# Patient Record
Sex: Male | Born: 1950
Health system: Southern US, Community
[De-identification: ages and names within clinical notes are randomized; demographics above are authoritative.]

## PROBLEM LIST (undated history)

## (undated) DIAGNOSIS — I10 Essential (primary) hypertension: Secondary | ICD-10-CM

## (undated) DIAGNOSIS — T7840XA Allergy, unspecified, initial encounter: Secondary | ICD-10-CM

## (undated) DIAGNOSIS — M1990 Unspecified osteoarthritis, unspecified site: Secondary | ICD-10-CM

## (undated) HISTORY — DX: Allergy, unspecified, initial encounter: T78.40XA

## (undated) HISTORY — DX: Essential (primary) hypertension: I10

## (undated) HISTORY — DX: Unspecified osteoarthritis, unspecified site: M19.90

---

## 2000-06-06 ENCOUNTER — Emergency Department (HOSPITAL_COMMUNITY): Admission: EM | Admit: 2000-06-06 | Discharge: 2000-06-06 | Payer: Self-pay | Admitting: Emergency Medicine

## 2011-07-31 ENCOUNTER — Other Ambulatory Visit: Payer: Self-pay

## 2011-07-31 MED ORDER — LISINOPRIL-HYDROCHLOROTHIAZIDE 20-25 MG PO TABS: 1.0000 | ORAL_TABLET | Freq: Every day | ORAL | Status: DC

## 2011-07-31 MED ORDER — AMLODIPINE BESYLATE 5 MG PO TABS: 5.0000 mg | ORAL_TABLET | Freq: Every day | ORAL | Status: DC

## 2011-08-23 ENCOUNTER — Ambulatory Visit (INDEPENDENT_AMBULATORY_CARE_PROVIDER_SITE_OTHER): Payer: BC Managed Care – PPO | Admitting: Family Medicine

## 2011-08-23 VITALS — BP 134/84 | HR 91 | Temp 98.7°F | Resp 18 | Ht 75.0 in | Wt 245.0 lb

## 2011-08-23 DIAGNOSIS — I10 Essential (primary) hypertension: Secondary | ICD-10-CM

## 2011-08-23 DIAGNOSIS — R300 Dysuria: Secondary | ICD-10-CM

## 2011-08-23 LAB — POCT URINALYSIS DIPSTICK
Glucose, UA: NEGATIVE
Leukocytes, UA: NEGATIVE
Nitrite, UA: NEGATIVE
Urobilinogen, UA: 0.2
pH, UA: 7

## 2011-08-23 LAB — POCT UA - MICROSCOPIC ONLY: Casts, Ur, LPF, POC: NEGATIVE

## 2011-08-23 LAB — BASIC METABOLIC PANEL
CO2: 27 mEq/L (ref 19–32)
Calcium: 10.1 mg/dL (ref 8.4–10.5)
Creat: 1.01 mg/dL (ref 0.50–1.35)

## 2011-08-23 MED ORDER — LISINOPRIL-HYDROCHLOROTHIAZIDE 20-25 MG PO TABS: 1.0000 | ORAL_TABLET | Freq: Every day | ORAL | Status: DC

## 2011-08-23 MED ORDER — AMLODIPINE BESYLATE 5 MG PO TABS: 5.0000 mg | ORAL_TABLET | Freq: Every day | ORAL | Status: DC

## 2011-08-23 NOTE — Progress Notes (Signed)
  Subjective:    Patient ID: Fred Graham, male    DOB: 06-23-1950, 61 y.o.   MRN: [id number]  HPI 61 yo male with HTN here for refills and concern about UTI.  1)HTN - tolerates amolodipine and lisinopril-HCTZ well.   Close to running out.   2) UTI - burning with urination, some increased frequency.  2 days.  No history of UTI.     Review of Systems Negative except as per HPI     Objective:   Physical Exam  Constitutional: He appears well-developed and well-nourished.  Cardiovascular: Normal rate, regular rhythm, normal heart sounds and intact distal pulses.   No murmur heard. Pulmonary/Chest: Effort normal and breath sounds normal.  Neurological: He is alert.  Skin: Skin is warm and dry.    Results for orders placed in visit on 08/23/11  POCT URINALYSIS DIPSTICK      Component Value Range   Color, UA yellow     Clarity, UA clear     Glucose, UA neg     Bilirubin, UA neg     Ketones, UA neg     Spec Grav, UA 1.020     Blood, UA trace     pH, UA 7.0     Protein, UA neg     Urobilinogen, UA 0.2     Nitrite, UA neg     Leukocytes, UA Negative    POCT UA - MICROSCOPIC ONLY      Component Value Range   WBC, Ur, HPF, POC 2-3     RBC, urine, microscopic 2-3     Bacteria, U Microscopic trace     Mucus, UA neg     Epithelial cells, urine per micros 2-4     Crystals, Ur, HPF, POC oxalate     Casts, Ur, LPF, POC neg     Yeast, UA neg     ,/      Assessment & Plan:  HTN - meds refilled. BMET pending  Dysuria - normal dip.  Culture pending.  Increase water while awaiting results.

## 2011-08-25 ENCOUNTER — Ambulatory Visit (INDEPENDENT_AMBULATORY_CARE_PROVIDER_SITE_OTHER): Payer: BC Managed Care – PPO | Admitting: Family Medicine

## 2011-08-25 VITALS — BP 114/74 | HR 87 | Temp 98.6°F | Resp 16 | Ht 74.5 in | Wt 247.0 lb

## 2011-08-25 DIAGNOSIS — R300 Dysuria: Secondary | ICD-10-CM

## 2011-08-25 DIAGNOSIS — N390 Urinary tract infection, site not specified: Secondary | ICD-10-CM

## 2011-08-25 LAB — POCT UA - MICROSCOPIC ONLY
Casts, Ur, LPF, POC: NEGATIVE
Crystals, Ur, HPF, POC: NEGATIVE
Yeast, UA: NEGATIVE

## 2011-08-25 LAB — POCT URINALYSIS DIPSTICK
Blood, UA: NEGATIVE
Protein, UA: NEGATIVE
Spec Grav, UA: 1.015
Urobilinogen, UA: 0.2

## 2011-08-25 MED ORDER — PHENAZOPYRIDINE HCL 100 MG PO TABS: 100.0000 mg | ORAL_TABLET | Freq: Three times a day (TID) | ORAL | Status: AC | PRN

## 2011-08-25 MED ORDER — CIPROFLOXACIN HCL 500 MG PO TABS: 500.0000 mg | ORAL_TABLET | Freq: Two times a day (BID) | ORAL | Status: AC

## 2011-08-25 NOTE — Progress Notes (Signed)
  Subjective:    Patient ID: Fred Graham, male    DOB: [birth year], 61 y.o.   MRN: [id number]  HPI 61 yo male here to follow-up dysuria.  See 3/7.  Dip relatively normal.  Culture sent.  Since, dysuria has worsened.  Also with frequency and hesitancy.  Fever to 101 yesterday.   Culture did come back this morning, prelim report is 15,000 colonies of enterococcus species.  No sensitivities back yet.    Review of Systems Negative except as per HPI     Objective:   Physical Exam  Constitutional: Vital signs are normal. He appears well-developed and well-nourished. He is active.  Cardiovascular: Normal rate, regular rhythm, normal heart sounds and normal pulses.   Pulmonary/Chest: Effort normal and breath sounds normal.  Abdominal: Soft. Normal appearance and bowel sounds are normal. He exhibits no distension and no mass. There is no hepatosplenomegaly. There is no tenderness. There is no rigidity, no rebound, no guarding, no CVA tenderness, no tenderness at McBurney's point and negative Murphy's sign. No hernia.  Neurological: He is alert.    Results for orders placed in visit on 08/25/11  POCT URINALYSIS DIPSTICK      Component Value Range   Color, UA yellow     Clarity, UA clear     Glucose, UA negative     Bilirubin, UA negative     Ketones, UA negative     Spec Grav, UA 1.015     Blood, UA negative     pH, UA 7.5     Protein, UA negative     Urobilinogen, UA 0.2     Nitrite, UA negative     Leukocytes, UA Negative    POCT UA - MICROSCOPIC ONLY      Component Value Range   WBC, Ur, HPF, POC negative     RBC, urine, microscopic 0-1     Bacteria, U Microscopic negative     Mucus, UA positive     Epithelial cells, urine per micros 0-1     Crystals, Ur, HPF, POC negative     Casts, Ur, LPF, POC negative     Yeast, UA negative           Assessment & Plan:  U/A not much changed from previous, but given symptoms and growth of one species on original culture, will treat as  UTI/prostatitis with cipro.  Await c&S though and change if nec.

## 2011-08-27 LAB — URINE CULTURE: Colony Count: 15000

## 2012-02-20 ENCOUNTER — Ambulatory Visit (INDEPENDENT_AMBULATORY_CARE_PROVIDER_SITE_OTHER): Payer: BC Managed Care – PPO | Admitting: Family Medicine

## 2012-02-20 VITALS — BP 150/85 | HR 103 | Temp 98.3°F | Resp 17 | Ht 74.5 in | Wt 247.0 lb

## 2012-02-20 DIAGNOSIS — I10 Essential (primary) hypertension: Secondary | ICD-10-CM

## 2012-02-20 MED ORDER — LISINOPRIL-HYDROCHLOROTHIAZIDE 20-25 MG PO TABS: 1.0000 | ORAL_TABLET | Freq: Every day | ORAL | Status: DC

## 2012-02-20 MED ORDER — AMLODIPINE BESYLATE 5 MG PO TABS: 5.0000 mg | ORAL_TABLET | Freq: Every day | ORAL | Status: DC

## 2012-02-20 NOTE — Progress Notes (Signed)
  [facility]:  Office Visit  Chief Complaint:  Chief Complaint  Patient presents with  . Medication Refill    HPI: Fred Graham is a 61 y.o. male who complains of  Medication refills. Doing well on meds. No SEs.   Past Medical History  Diagnosis Date  . Hypertension    History reviewed. No pertinent past surgical history. History   Social History  . Marital Status: Divorced    Spouse Name: N/A    Number of Children: N/A  . Years of Education: N/A   Social History Main Topics  . Smoking status: Former Games developer  . Smokeless tobacco: None  . Alcohol Use: None  . Drug Use: None  . Sexually Active: None   Other Topics Concern  . None   Social History Narrative  . None   No family history on file. Allergies  Allergen Reactions  . Penicillins    Prior to Admission medications   Medication Sig Start Date End Date Taking? Authorizing Provider  amLODipine (NORVASC) 5 MG tablet Take 1 tablet (5 mg total) by mouth daily. 08/23/11 08/22/12 Yes Lamar Laundry, MD  lisinopril-hydrochlorothiazide (PRINZIDE,ZESTORETIC) 20-25 MG per tablet Take 1 tablet by mouth daily. 08/23/11 08/22/12 Yes Lamar Laundry, MD     ROS: The patient denies fevers, chills, night sweats, unintentional weight loss, chest pain, palpitations, wheezing, dyspnea on exertion, nausea, vomiting, abdominal pain, dysuria, hematuria, melena, numbness, weakness, or tingling.   All other systems have been reviewed and were otherwise negative with the exception of those mentioned in the HPI and as above.    PHYSICAL EXAM: Filed Vitals:   02/20/12 2055  BP: 150/85  Pulse: 103  Temp: 98.3 F (36.8 C)  Resp: 17  Repeat pulse 90 Filed Vitals:   02/20/12 2055  Height: 6' 2.5" (1.892 m)  Weight: 247 lb (112.038 kg)   Body mass index is 31.29 kg/(m^2).  General: Alert, no acute distress HEENT:  Normocephalic, atraumatic, oropharynx patent. PERRLA, EOMI, fundoscopic exam nl Cardiovascular:   Regular rate and rhythm, no rubs murmurs or gallops.  No Carotid bruits, radial pulse intact. No pedal edema.  Respiratory: Clear to auscultation bilaterally.  No wheezes, rales, or rhonchi.  No cyanosis, no use of accessory musculature GI: No organomegaly, abdomen is soft and non-tender, positive bowel sounds.  No masses. Skin: No rashes. Neurologic: Facial musculature symmetric. Psychiatric: Patient is appropriate throughout our interaction. Lymphatic: No cervical lymphadenopathy Musculoskeletal: Gait intact.   LABS:    EKG/XRAY:   Primary read interpreted by Dr. Conley Rolls at [facility].   ASSESSMENT/PLAN: Encounter Diagnosis  Name Primary?  . HTN (hypertension) Yes   Refill meds x 6 months F/u in 6 months     ,  PHUONG, DO 02/21/2012 1:41 PM

## 2012-02-21 ENCOUNTER — Encounter: Payer: Self-pay | Admitting: Family Medicine

## 2012-02-22 LAB — BASIC METABOLIC PANEL
BUN: 12 mg/dL (ref 6–23)
CO2: 30 mEq/L (ref 19–32)
Calcium: 9.9 mg/dL (ref 8.4–10.5)
Chloride: 97 mEq/L (ref 96–112)
Creat: 0.98 mg/dL (ref 0.50–1.35)

## 2012-02-22 LAB — BASIC METABOLIC PANEL WITH GFR
Glucose, Bld: 76 mg/dL (ref 70–99)
Potassium: 4.5 meq/L (ref 3.5–5.3)
Sodium: 135 meq/L (ref 135–145)

## 2012-02-26 ENCOUNTER — Encounter: Payer: Self-pay | Admitting: Family Medicine

## 2012-04-27 ENCOUNTER — Ambulatory Visit (INDEPENDENT_AMBULATORY_CARE_PROVIDER_SITE_OTHER): Payer: BC Managed Care – PPO | Admitting: Emergency Medicine

## 2012-04-27 VITALS — BP 159/91 | HR 107 | Temp 98.0°F | Resp 17 | Ht 75.0 in | Wt 244.0 lb

## 2012-04-27 DIAGNOSIS — R319 Hematuria, unspecified: Secondary | ICD-10-CM

## 2012-04-27 LAB — POCT UA - MICROSCOPIC ONLY
Bacteria, U Microscopic: NEGATIVE
Mucus, UA: NEGATIVE
RBC, urine, microscopic: NEGATIVE
WBC, Ur, HPF, POC: NEGATIVE
Yeast, UA: NEGATIVE

## 2012-04-27 LAB — POCT CBC
Granulocyte percent: 80 %G (ref 37–80)
HCT, POC: 47.2 % (ref 43.5–53.7)
Hemoglobin: 15.4 g/dL (ref 14.1–18.1)
Lymph, poc: 1 (ref 0.6–3.4)
MCV: 93.8 fL (ref 80–97)
POC Granulocyte: 5.7 (ref 2–6.9)

## 2012-04-27 LAB — POCT URINALYSIS DIPSTICK
Bilirubin, UA: NEGATIVE
Glucose, UA: NEGATIVE
Ketones, UA: NEGATIVE
Leukocytes, UA: NEGATIVE
Spec Grav, UA: 1.015

## 2012-04-27 LAB — PSA: PSA: 0.84 ng/mL (ref <=4.00)

## 2012-04-27 NOTE — Progress Notes (Signed)
[facility] [address] [place] [address] [phone]  Date:  04/27/2012   Name:  Fred Graham   DOB:  July 03, 1950   MRN:  [id number]  PCP:  No primary provider on file.    Chief Complaint: Penis Injury   History of Present Illness:  Fred Graham is a 61 y.o. very pleasant male patient who presents with the following:  Has recently noticed a drop of blood on his underwear.  No history of dysuria, urgency, frequency, symptoms suggestive of prostatism.  No fever or chills, forceful or vigorous intercourse or injury to his genitalia.  First noticed a few days ago.  No prior history of prior GU complaint or stones  There is no problem list on file for this patient.   Past Medical History  Diagnosis Date  . Hypertension     No past surgical history on file.  History  Substance Use Topics  . Smoking status: Former Games developer  . Smokeless tobacco: Not on file  . Alcohol Use: Not on file    No family history on file.  Allergies  Allergen Reactions  . Penicillins     Medication list has been reviewed and updated.  Current Outpatient Prescriptions on File Prior to Visit  Medication Sig Dispense Refill  . amLODipine (NORVASC) 5 MG tablet Take 1 tablet (5 mg total) by mouth daily.  90 tablet  1  . lisinopril-hydrochlorothiazide (PRINZIDE,ZESTORETIC) 20-25 MG per tablet Take 1 tablet by mouth daily.  90 tablet  1    Review of Systems:  As per HPI, otherwise negative.    Physical Examination: Filed Vitals:   04/27/12 1203  BP: 159/91  Pulse: 107  Temp: 98 F (36.7 C)  Resp: 17   Filed Vitals:   04/27/12 1203  Height: 6\' 3"  (1.905 m)  Weight: 244 lb (110.678 kg)   Body mass index is 30.50 kg/(m^2). Ideal Body Weight: Weight in (lb) to have BMI = 25: 199.6    GEN: WDWN, NAD, Non-toxic, Alert & Oriented x 3 HEENT: Atraumatic, Normocephalic.  Ears and Nose: No external deformity. EXTR: No clubbing/cyanosis/edema NEURO: Normal gait.    PSYCH: Normally interactive. Conversant. Not depressed or anxious appearing.  Calm demeanor.  Genitalia: normal male  Assessment and Plan: Hematuria   Carmelina Dane, MD

## 2012-07-31 NOTE — Progress Notes (Signed)
Reviewed and agree.

## 2012-08-27 ENCOUNTER — Ambulatory Visit (INDEPENDENT_AMBULATORY_CARE_PROVIDER_SITE_OTHER): Payer: BC Managed Care – PPO | Admitting: Emergency Medicine

## 2012-08-27 VITALS — BP 138/80 | HR 97 | Temp 98.0°F | Resp 18 | Ht 75.58 in | Wt 250.4 lb

## 2012-08-27 DIAGNOSIS — I10 Essential (primary) hypertension: Secondary | ICD-10-CM

## 2012-08-27 MED ORDER — AMLODIPINE BESYLATE 5 MG PO TABS: 5.0000 mg | ORAL_TABLET | Freq: Every day | ORAL | Status: DC

## 2012-08-27 MED ORDER — LISINOPRIL-HYDROCHLOROTHIAZIDE 20-25 MG PO TABS: 1.0000 | ORAL_TABLET | Freq: Every day | ORAL | Status: DC

## 2012-08-27 NOTE — Patient Instructions (Addendum)

## 2012-08-27 NOTE — Progress Notes (Signed)
[facility] [address], [place] [address] [phone]  Date:  08/27/2012   Name:  Fred Graham   DOB:  06/24/1950   MRN:  [id number]  PCP:  No primary provider on file.    Chief Complaint: Hypertension   History of Present Illness:  Fred Graham is a 62 y.o. very pleasant male patient who presents with the following:  History of hypertension and needs a refill on his medication. Tolerating medication with no adverse effect.  Last labs 10/13.  No evidence of end organ damage.  Denies other complaint or health concern today. No improvement with over the counter medications or other home remedies.   There is no problem list on file for this patient.   Past Medical History  Diagnosis Date  . Hypertension   . Allergy   . Arthritis     No past surgical history on file.  History  Substance Use Topics  . Smoking status: Former Games developer  . Smokeless tobacco: Not on file  . Alcohol Use: 8.4 oz/week    14 Cans of beer per week    Family History  Problem Relation Age of Onset  . Cancer Brother     Allergies  Allergen Reactions  . Penicillins     Medication list has been reviewed and updated.  Current Outpatient Prescriptions on File Prior to Visit  Medication Sig Dispense Refill  . amLODipine (NORVASC) 5 MG tablet Take 1 tablet (5 mg total) by mouth daily.  90 tablet  1  . lisinopril-hydrochlorothiazide (PRINZIDE,ZESTORETIC) 20-25 MG per tablet Take 1 tablet by mouth daily.  90 tablet  1   No current facility-administered medications on file prior to visit.    Review of Systems:  As per HPI, otherwise negative.    Physical Examination: Filed Vitals:   08/27/12 2001  BP: 138/80  Pulse: 97  Temp: 98 F (36.7 C)  Resp: 18   Filed Vitals:   08/27/12 2001  Height: 6' 3.58" (1.92 m)  Weight: 250 lb 6.4 oz (113.581 kg)   Body mass index is 30.81 kg/(m^2). Ideal Body Weight: Weight in (lb) to have BMI = 25: 202.7  GEN: WDWN, NAD,  Non-toxic, A & O x 3 HEENT: Atraumatic, Normocephalic. Neck supple. No masses, No LAD. Ears and Nose: No external deformity. CV: RRR, No M/G/R. No JVD. No thrill. No extra heart sounds. PULM: CTA B, no wheezes, crackles, rhonchi. No retractions. No resp. distress. No accessory muscle use. ABD: S, NT, ND, +BS. No rebound. No HSM. EXTR: No c/c/e NEURO Normal gait.  PSYCH: Normally interactive. Conversant. Not depressed or anxious appearing.  Calm demeanor.    Assessment and Plan: Hypertension Labs in 6 months  Carmelina Dane, MD

## 2013-02-22 ENCOUNTER — Ambulatory Visit (INDEPENDENT_AMBULATORY_CARE_PROVIDER_SITE_OTHER): Payer: BC Managed Care – PPO | Admitting: Family Medicine

## 2013-02-22 VITALS — BP 128/82 | HR 88 | Temp 97.7°F | Resp 16 | Ht 76.0 in | Wt 235.0 lb

## 2013-02-22 DIAGNOSIS — I10 Essential (primary) hypertension: Secondary | ICD-10-CM | POA: Insufficient documentation

## 2013-02-22 DIAGNOSIS — E871 Hypo-osmolality and hyponatremia: Secondary | ICD-10-CM

## 2013-02-22 LAB — POCT CBC
Granulocyte percent: 70.8 %G (ref 37–80)
HCT, POC: 43.7 % (ref 43.5–53.7)
Hemoglobin: 14.2 g/dL (ref 14.1–18.1)
POC Granulocyte: 3.9 (ref 2–6.9)
POC LYMPH PERCENT: 22.3 %L (ref 10–50)
RBC: 4.59 M/uL — AB (ref 4.69–6.13)
RDW, POC: 13.1 %

## 2013-02-22 MED ORDER — LISINOPRIL-HYDROCHLOROTHIAZIDE 20-25 MG PO TABS: 1.0000 | ORAL_TABLET | Freq: Every day | ORAL | Status: DC

## 2013-02-22 MED ORDER — AMLODIPINE BESYLATE 5 MG PO TABS: 5.0000 mg | ORAL_TABLET | Freq: Every day | ORAL | Status: DC

## 2013-02-22 NOTE — Patient Instructions (Addendum)
We have sent your medications to your drug store.  I will be in touch regarding your labs when they come in.  Please come and see [place] in about 6 months for a BP check

## 2013-02-22 NOTE — Progress Notes (Addendum)
[facility] [address], [place] [address] [phone]  Date:  02/22/2013   Name:  Fred Graham   DOB:  March 24, 1951   MRN:  [id number]  PCP:  No primary provider on file.    Chief Complaint: Medication Refill   History of Present Illness:  Fred Graham is a 62 y.o. very pleasant male patient who presents with the following:  He is here today to follow-up on his BP.  Last labs in November of 2013.  He last ate about 3 hours ago today.  He has no other concerns and declines a flu shot today. His BP has been running within normal limits per his reort  There are no active problems to display for this patient.   Past Medical History  Diagnosis Date  . Hypertension   . Allergy   . Arthritis     History reviewed. No pertinent past surgical history.  History  Substance Use Topics  . Smoking status: Former Games developer  . Smokeless tobacco: Not on file  . Alcohol Use: 8.4 oz/week    14 Cans of beer per week    Family History  Problem Relation Age of Onset  . Cancer Brother     Allergies  Allergen Reactions  . Penicillins     Medication list has been reviewed and updated.  Current Outpatient Prescriptions on File Prior to Visit  Medication Sig Dispense Refill  . amLODipine (NORVASC) 5 MG tablet Take 1 tablet (5 mg total) by mouth daily.  90 tablet  1  . aspirin 81 MG tablet Take 81 mg by mouth daily.      Marland Kitchen lisinopril-hydrochlorothiazide (PRINZIDE,ZESTORETIC) 20-25 MG per tablet Take 1 tablet by mouth daily.  90 tablet  1   No current facility-administered medications on file prior to visit.    Review of Systems:  As per HPI- otherwise negative.   Physical Examination: Filed Vitals:   02/22/13 1637  BP: 128/82  Pulse: 88  Temp: 97.7 F (36.5 C)  Resp: 16   Filed Vitals:   02/22/13 1637  Height: 6\' 4"  (1.93 m)  Weight: 235 lb (106.595 kg)   Body mass index is 28.62 kg/(m^2). Ideal Body Weight: Weight in (lb) to have BMI = 25:  205  GEN: WDWN, NAD, Non-toxic, A & O x 3, somewhat hard of hearing  HEENT: Atraumatic, Normocephalic. Neck supple. No masses, No LAD. Ears and Nose: No external deformity. CV: RRR, No M/G/R. No JVD. No thrill. No extra heart sounds. PULM: CTA B, no wheezes, crackles, rhonchi. No retractions. No resp. distress. No accessory muscle use. EXTR: No c/c/e NEURO Normal gait.  PSYCH: Normally interactive. Conversant. Not depressed or anxious appearing.  Calm demeanor.    Assessment and Plan: HTN (hypertension) - Plan: amLODipine (NORVASC) 5 MG tablet, lisinopril-hydrochlorothiazide (PRINZIDE,ZESTORETIC) 20-25 MG per tablet, POCT CBC, Comprehensive metabolic panel, LDL cholesterol, direct  Controlled HTN.  Refilled medications and drew labs today.  Will plan further follow- up pending labs. See patient instructions for more details.     Signed Abbe Amsterdam, MD  9/14- called and D/w pt.  He just filled a 3 month supply of his lisinopril/ hctz and would prefer not to waste this medication.  Plan to have him come by for a BMP only this week to see if hyponatremia may have been transient.

## 2013-02-23 LAB — COMPREHENSIVE METABOLIC PANEL
Albumin: 4.6 g/dL (ref 3.5–5.2)
Alkaline Phosphatase: 46 U/L (ref 39–117)
Calcium: 9.9 mg/dL (ref 8.4–10.5)
Chloride: 92 mEq/L — ABNORMAL LOW (ref 96–112)
Glucose, Bld: 86 mg/dL (ref 70–99)
Potassium: 4.2 mEq/L (ref 3.5–5.3)
Sodium: 130 mEq/L — ABNORMAL LOW (ref 135–145)
Total Protein: 7 g/dL (ref 6.0–8.3)

## 2013-03-01 ENCOUNTER — Encounter: Payer: Self-pay | Admitting: Family Medicine

## 2013-03-01 NOTE — Addendum Note (Signed)
Addended by: Abbe Amsterdam C on: 03/01/2013 01:51 PM   Modules accepted: Orders

## 2013-12-06 ENCOUNTER — Ambulatory Visit (INDEPENDENT_AMBULATORY_CARE_PROVIDER_SITE_OTHER): Payer: BC Managed Care – PPO | Admitting: Emergency Medicine

## 2013-12-06 ENCOUNTER — Ambulatory Visit (INDEPENDENT_AMBULATORY_CARE_PROVIDER_SITE_OTHER): Payer: BC Managed Care – PPO

## 2013-12-06 VITALS — BP 140/74 | HR 88 | Temp 97.5°F | Resp 20 | Ht 75.0 in | Wt 230.6 lb

## 2013-12-06 DIAGNOSIS — M25551 Pain in right hip: Secondary | ICD-10-CM

## 2013-12-06 DIAGNOSIS — M25559 Pain in unspecified hip: Secondary | ICD-10-CM

## 2013-12-06 MED ORDER — HYDROCODONE-ACETAMINOPHEN 5-325 MG PO TABS: 1.0000 | ORAL_TABLET | Freq: Four times a day (QID) | ORAL | Status: DC | PRN

## 2013-12-06 NOTE — Progress Notes (Signed)
   Subjective:  This chart was scribed for Lesle ChrisSteven Daub, MD by Elveria Risingimelie Horne, Medial Scribe. This patient was seen in room 13 and the patient's care was started at 12:24 PM.    Patient ID: Fred Graham, male    DOB: January 01, 1951, 63 y.o.   MRN: [id number]  HPI HPI Comments: Fred Picketarl K Kelson is a 63 y.o. male who presents to the [facility] with a right leg injury. Patient reports turning with right foot fully planted and hearing a pop in his right groin. Patient describes the pain as intermittent. Patient states that his pain is worse when attempting to stand after sitting for extended periods of time.    Patient Active Problem List   Diagnosis Date Noted  . Essential hypertension, benign 02/22/2013   Past Medical History  Diagnosis Date  . Hypertension   . Allergy   . Arthritis    No past surgical history on file. Allergies  Allergen Reactions  . Penicillins    Prior to Admission medications   Medication Sig Start Date End Date Taking? Authorizing Macenzie Burford  amLODipine (NORVASC) 5 MG tablet Take 1 tablet (5 mg total) by mouth daily. 02/22/13 02/22/14  Pearline CablesJessica C Copland, MD  aspirin 81 MG tablet Take 81 mg by mouth daily.    Historical Atwood Adcock, MD  lisinopril-hydrochlorothiazide (PRINZIDE,ZESTORETIC) 20-25 MG per tablet Take 1 tablet by mouth daily. 02/22/13 02/22/14  Pearline CablesJessica C Copland, MD   History   Social History  . Marital Status: Divorced    Spouse Name: N/A    Number of Children: N/A  . Years of Education: N/A   Occupational History  . Not on file.   Social History Main Topics  . Smoking status: Former Games developermoker  . Smokeless tobacco: Not on file  . Alcohol Use: 8.4 oz/week    14 Cans of beer per week  . Drug Use: No  . Sexual Activity: No   Other Topics Concern  . Not on file   Social History Narrative  . No narrative on file     Review of Systems  Constitutional: Negative for fever and chills.  Musculoskeletal: Positive for arthralgias and myalgias.   Neurological: Negative for weakness and numbness.       Objective:   Physical Exam  CONSTITUTIONAL: Well developed/well nourished HEAD: Normocephalic/atraumatic EYES: EOMI/PERRL ENMT: Mucous membranes moist NECK: supple no meningeal signs SPINE:entire spine nontender CV: S1/S2 noted, no murmurs/rubs/gallops noted LUNGS: Lungs are clear to auscultation bilaterally, no apparent distress ABDOMEN: soft, nontender, no rebound or guarding; pain with adduction of the right hip; decreased interal rotation of the right hip, no palpable hernia; reflexes 2+ and symmetrical GU:no cva tenderness NEURO: Pt is awake/alert, moves all extremitiesx4 EXTREMITIES: pulses normal, full ROM SKIN: warm, color normal PSYCH: no abnormalities of mood noted  Filed Vitals:   12/06/13 1142  BP: 140/74  Pulse: 88  Temp: 97.5 F (36.4 C)  Resp: 20   [facility] reading (PRIMARY) by  Dr. Cleta Albertsaub there degenerative changes of the right hip. Left hip compared to show only mild change      Assessment & Plan:  Wrap him with success a straps. I did give him some hydrocodone at night for pain he otherwise will take either Advil or Aleve during the day.

## 2013-12-06 NOTE — Patient Instructions (Signed)
Osteoarthritis Osteoarthritis is a disease that causes soreness and swelling (inflammation) of a joint. It occurs when the cartilage at the affected joint wears down. Cartilage acts as a cushion, covering the ends of bones where they meet to form a joint. Osteoarthritis is the most common form of arthritis. It often occurs in older people. The joints affected most often by this condition include those in the:  Ends of the fingers.  Thumbs.  Neck.  Lower back.  Knees.  Hips. CAUSES  Over time, the cartilage that covers the ends of bones begins to wear away. This causes bone to rub on bone, producing pain and stiffness in the affected joints.  RISK FACTORS Certain factors can increase your chances of having osteoarthritis, including:  Older age.  Excessive body weight.  Overuse of joints. SIGNS AND SYMPTOMS   Pain, swelling, and stiffness in the joint.  Over time, the joint may lose its normal shape.  Small deposits of bone (osteophytes) may grow on the edges of the joint.  Bits of bone or cartilage can break off and float inside the joint space. This may cause more pain and damage. DIAGNOSIS  Your health care provider will do a physical exam and ask about your symptoms. Various tests may be ordered, such as:  X-rays of the affected joint.  An MRI scan.  Blood tests to rule out other types of arthritis.  Joint fluid tests. This involves using a needle to draw fluid from the joint and examining the fluid under a microscope. TREATMENT  Goals of treatment are to control pain and improve joint function. Treatment plans may include:  A prescribed exercise program that allows for rest and joint relief.  A weight control plan.  Pain relief techniques, such as:  Properly applied heat and cold.  Electric pulses delivered to nerve endings under the skin (transcutaneous electrical nerve stimulation, TENS).  Massage.  Certain nutritional supplements.  Medicines to  control pain, such as:  Acetaminophen.  Nonsteroidal anti-inflammatory drugs (NSAIDs), such as naproxen.  Narcotic or central-acting agents, such as tramadol.  Corticosteroids. These can be given orally or as an injection.  Surgery to reposition the bones and relieve pain (osteotomy) or to remove loose pieces of bone and cartilage. Joint replacement may be needed in advanced states of osteoarthritis. HOME CARE INSTRUCTIONS   Only take over-the-counter or prescription medicines as directed by your health care provider. Take all medicines exactly as instructed.  Maintain a healthy weight. Follow your health care provider's instructions for weight control. This may include dietary instructions.  Exercise as directed. Your health care provider can recommend specific types of exercise. These may include:  Strengthening exercises--These are done to strengthen the muscles that support joints affected by arthritis. They can be performed with weights or with exercise bands to add resistance.  Aerobic activities--These are exercises, such as brisk walking or low-impact aerobics, that get your heart pumping.  Range-of-motion activities--These keep your joints limber.  Balance and agility exercises--These help you maintain daily living skills.  Rest your affected joints as directed by your health care provider.  Follow up with your health care provider as directed. SEEK MEDICAL CARE IF:   Your skin turns red.  You develop a rash in addition to your joint pain.  You have worsening joint pain. SEEK IMMEDIATE MEDICAL CARE IF:  You have a significant loss of weight or appetite.  You have a fever along with joint or muscle aches.  You have night sweats. FOR MORE   INFORMATION  National Institute of Arthritis and Musculoskeletal and Skin Diseases: www.niams.nih.gov National Institute on Aging: www.nia.nih.gov American College of Rheumatology: www.rheumatology.org Document Released:  06/04/2005 Document Revised: 03/25/2013 Document Reviewed: 02/09/2013 Exit[employer] Patient Information 2015 [employer]. This information is not intended to replace advice given to you by your health care provider. Make sure you discuss any questions you have with your health care provider.  

## 2014-02-09 ENCOUNTER — Other Ambulatory Visit: Payer: Self-pay | Admitting: Family Medicine

## 2014-03-07 ENCOUNTER — Encounter: Payer: Self-pay | Admitting: Family Medicine

## 2014-03-15 ENCOUNTER — Other Ambulatory Visit: Payer: Self-pay | Admitting: Physician Assistant

## 2014-04-04 ENCOUNTER — Ambulatory Visit (INDEPENDENT_AMBULATORY_CARE_PROVIDER_SITE_OTHER): Payer: BC Managed Care – PPO | Admitting: Family Medicine

## 2014-04-04 VITALS — BP 133/79 | HR 99 | Temp 98.3°F | Resp 12 | Ht 76.0 in | Wt 235.0 lb

## 2014-04-04 DIAGNOSIS — I10 Essential (primary) hypertension: Secondary | ICD-10-CM

## 2014-04-04 LAB — POCT URINALYSIS DIPSTICK
BILIRUBIN UA: NEGATIVE
GLUCOSE UA: NEGATIVE
Leukocytes, UA: NEGATIVE
Nitrite, UA: NEGATIVE
Protein, UA: NEGATIVE
Spec Grav, UA: 1.015
Urobilinogen, UA: 0.2
pH, UA: 7

## 2014-04-04 MED ORDER — LISINOPRIL-HYDROCHLOROTHIAZIDE 20-25 MG PO TABS: 1.0000 | ORAL_TABLET | Freq: Every day | ORAL | Status: DC

## 2014-04-04 MED ORDER — AMLODIPINE BESYLATE 5 MG PO TABS: 5.0000 mg | ORAL_TABLET | Freq: Every day | ORAL | Status: DC

## 2014-04-04 NOTE — Progress Notes (Signed)
Subjective:    Patient ID: Fred Graham, male    DOB: Mar 28, 1951, 63 y.o.   MRN: [id number]  This chart was scribed for Wardell Honour, MD by Edison Simon, ED Scribe. This patient was seen in room 3 and the patient's care was started at 4:24 PM.   04/04/2014  Medication Refill  HPI HPI Comments: Fred Graham is a 63 y.o. male who presents to the [facility] for blood pressure medication refill. He states he takes his medications every day and is compliant since it was prescribed to him 12 months ago. He states he has had high blood pressure for approximately 5 years. He states he has been taking his blood pressure at home until his machine malfunctioned this week; he states his blood pressure usually measures around 120/70. He states he does not experience any medication side effects.   He reports seasonal allergies for which he uses Hall's menthols only. He also reports arthritis to his right hip and legs. He denies any surgeries or hospitalizations.   He states his mother is 73 and had a colon blockage but no other significant medical problems. He states his father died at 48 of suicide, but he states he was not aware that he was depressed until soon before. He states his father had a CABG. He states he has 1 sister who does not have health problems. He states he has 1 brother who has had skin cancer problems and a cardiac stent put in at age 62-64.   He states he is divorced and not dating. He states he has a daughter and 4 grandchildren; he lives alone. 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 He states he has 4 drinks a day, is a former smoker, and walks 1-1.5 miles per day.   He states he used to use ASA every day but has not used it since he bumped himself and bled. He states he does not get regular physicals and has not had a colonoscopy. He states he does not get flu shots. He states  he had a tetanus shot a few years ago. He denies chest pain, palpitations, shortness of breath, persistent coughing, swelling in legs, blood or black stools, heartburn. He states he has a bowel movement every day. He reports nocturia but states he drinks a lot of fluid. He states he is sad at times because many of his friends have died recently, but is emotionally stable.  Review of Systems  Constitutional: Negative for fever, chills, diaphoresis, activity change, appetite change and fatigue.  Eyes: Negative for visual disturbance.  Respiratory: Negative for cough and shortness of breath.   Cardiovascular: Negative for chest pain, palpitations and leg swelling.  Gastrointestinal: Negative for nausea, vomiting, abdominal pain, diarrhea and blood in stool.       Denies acid reflux  Endocrine: Negative for cold intolerance, heat intolerance, polydipsia, polyphagia and polyuria.  Genitourinary:       Nocturia  Musculoskeletal: Negative for arthralgias.  Allergic/Immunologic: Positive for environmental allergies.  Neurological: Negative for dizziness, tremors, seizures, syncope, facial asymmetry, speech difficulty, weakness, light-headedness, numbness and headaches.  Psychiatric/Behavioral:       Sadness    Past Medical History  Diagnosis Date  . Hypertension   . Allergy   . Arthritis     hands, hips   History reviewed. No pertinent past surgical history. Allergies  Allergen Reactions  . Penicillins     Rash    Current Outpatient Prescriptions  Medication Sig Dispense Refill  . amLODipine (NORVASC) 5 MG tablet Take 1 tablet (5 mg total) by mouth daily.  90 tablet  1  . lisinopril-hydrochlorothiazide (PRINZIDE,ZESTORETIC) 20-25 MG per tablet Take 1 tablet by mouth daily.  90 tablet  1   No current facility-administered medications for this visit.       Objective:    BP 133/79  Pulse 99  Temp(Src) 98.3 F (36.8 C) (Oral)  Resp 12  Ht _0  (1.93 m)  Wt 235 lb (106.595 kg)   BMI 28.62 kg/m2  SpO2 96% Physical Exam  Nursing note and vitals reviewed. Constitutional: He is oriented to person, place, and time. He appears well-developed and well-nourished. No distress.  HENT:  Head: Normocephalic and atraumatic.  Right Ear: External ear normal.  Left Ear: External ear normal.  Nose: Nose normal.  Mouth/Throat: Oropharynx is clear and moist.  Eyes: Conjunctivae and EOM are normal. Pupils are equal, round, and reactive to light.  Neck: Normal range of motion. Neck supple. Carotid bruit is not present. No thyromegaly present.  No carotid bruit  Cardiovascular: Normal rate, regular rhythm, normal heart sounds and intact distal pulses.  Exam reveals no gallop and no friction rub.   No murmur heard. Pulmonary/Chest: Effort normal and breath sounds normal. No respiratory distress. He has no wheezes. He has no rales.  Abdominal: Soft. Bowel sounds are normal. He exhibits no distension and no mass. There is no tenderness. There is no rebound and no guarding.  Musculoskeletal: Normal range of motion.  Lymphadenopathy:    He has no cervical adenopathy.  Neurological: He is alert and oriented to person, place, and time. No cranial nerve deficit.  Skin: Skin is warm and dry. No rash noted. He is not diaphoretic.  Psychiatric: He has a normal mood and affect. His behavior is normal.   Results for orders placed in visit on 04/04/14  CBC WITH DIFFERENTIAL      Result Value Ref Range   WBC 5.6  4.0 - 10.5 K/uL   RBC 4.83  4.22 - 5.81 MIL/uL   Hemoglobin 15.0  13.0 - 17.0 g/dL   HCT 42.7  39.0 - 52.0 %   MCV 88.4  78.0 - 100.0 fL   MCH 31.1  26.0 - 34.0 pg   MCHC 35.1  30.0 - 36.0 g/dL   RDW 12.7  11.5 - 15.5 %   Platelets 258  150 - 400 K/uL   Neutrophils Relative % 71  43 - 77 %   Neutro Abs 4.0  1.7 - 7.7 K/uL   Lymphocytes Relative 17  12 - 46 %   Lymphs Abs 1.0  0.7 - 4.0 K/uL   Monocytes Relative 11  3 - 12 %   Monocytes Absolute 0.6  0.1 - 1.0 K/uL    Eosinophils Relative 0  0 - 5 %   Eosinophils Absolute 0.0  0.0 - 0.7 K/uL   Basophils Relative 1  0 - 1 %   Basophils Absolute 0.1  0.0 - 0.1 K/uL   Smear Review Criteria for review not met    COMPREHENSIVE METABOLIC PANEL      Result Value Ref Range   Sodium 132 (*) 135 - 145 mEq/L   Potassium 4.3  3.5 - 5.3 mEq/L   Chloride 94 (*) 96 - 112 mEq/L   CO2 31  19 - 32 mEq/L  Glucose, Bld 93  70 - 99 mg/dL   BUN 9  6 - 23 mg/dL   Creat 0.94  0.50 - 1.35 mg/dL   Total Bilirubin 0.8  0.2 - 1.2 mg/dL   Alkaline Phosphatase 44  39 - 117 U/L   AST 21  0 - 37 U/L   ALT 17  0 - 53 U/L   Total Protein 7.2  6.0 - 8.3 g/dL   Albumin 4.7  3.5 - 5.2 g/dL   Calcium 9.8  8.4 - 10.5 mg/dL  LIPID PANEL      Result Value Ref Range   Cholesterol 214 (*) 0 - 200 mg/dL   Triglycerides 81  <150 mg/dL   HDL 75  >39 mg/dL   Total CHOL/HDL Ratio 2.9     VLDL 16  0 - 40 mg/dL   LDL Cholesterol 123 (*) 0 - 99 mg/dL  TSH      Result Value Ref Range   TSH 1.774  0.350 - 4.500 uIU/mL  POCT URINALYSIS DIPSTICK      Result Value Ref Range   Color, UA YELLOW     Clarity, UA CLEAR     Glucose, UA NEG     Bilirubin, UA NEG     Ketones, UA TRACE     Spec Grav, UA 1.015     Blood, UA TRACE-INTACT     pH, UA 7.0     Protein, UA NEG     Urobilinogen, UA 0.2     Nitrite, UA NEG     Leukocytes, UA Negative         Assessment & Plan:   1. Essential hypertension    1. Hypertension: controlled; obtain labs, u/a; refill provided.  Recommend follow-up in six months.    Meds ordered this encounter  Medications  . lisinopril-hydrochlorothiazide (PRINZIDE,ZESTORETIC) 20-25 MG per tablet    Sig: Take 1 tablet by mouth daily.    Dispense:  90 tablet    Refill:  1  . amLODipine (NORVASC) 5 MG tablet    Sig: Take 1 tablet (5 mg total) by mouth daily.    Dispense:  90 tablet    Refill:  1    Return in about 6 months (around 10/04/2014) for recheck of high blood pressure.  I personally performed the  services described in this documentation, which was scribed in my presence.  The recorded information has been reviewed and is accurate.  Reginia Forts, M.D.  [facility] [address] [place], [place]  [id number] [phone] phone [phone] fax

## 2014-04-04 NOTE — Patient Instructions (Signed)

## 2014-04-05 LAB — COMPREHENSIVE METABOLIC PANEL
ALT: 17 U/L (ref 0–53)
AST: 21 U/L (ref 0–37)
Albumin: 4.7 g/dL (ref 3.5–5.2)
Alkaline Phosphatase: 44 U/L (ref 39–117)
BUN: 9 mg/dL (ref 6–23)
CO2: 31 mEq/L (ref 19–32)
Calcium: 9.8 mg/dL (ref 8.4–10.5)
Chloride: 94 mEq/L — ABNORMAL LOW (ref 96–112)
Creat: 0.94 mg/dL (ref 0.50–1.35)
Glucose, Bld: 93 mg/dL (ref 70–99)
Potassium: 4.3 mEq/L (ref 3.5–5.3)
Sodium: 132 mEq/L — ABNORMAL LOW (ref 135–145)
Total Bilirubin: 0.8 mg/dL (ref 0.2–1.2)
Total Protein: 7.2 g/dL (ref 6.0–8.3)

## 2014-04-05 LAB — LIPID PANEL
Cholesterol: 214 mg/dL — ABNORMAL HIGH (ref 0–200)
HDL: 75 mg/dL (ref >39)
LDL Cholesterol: 123 mg/dL — ABNORMAL HIGH (ref 0–99)
TRIGLYCERIDES: 81 mg/dL (ref <150)
Total CHOL/HDL Ratio: 2.9 Ratio
VLDL: 16 mg/dL (ref 0–40)

## 2014-04-05 LAB — CBC WITH DIFFERENTIAL/PLATELET
BASOS ABS: 0.1 10*3/uL (ref 0.0–0.1)
BASOS PCT: 1 % (ref 0–1)
EOS ABS: 0 10*3/uL (ref 0.0–0.7)
EOS PCT: 0 % (ref 0–5)
HCT: 42.7 % (ref 39.0–52.0)
Hemoglobin: 15 g/dL (ref 13.0–17.0)
Lymphocytes Relative: 17 % (ref 12–46)
Lymphs Abs: 1 10*3/uL (ref 0.7–4.0)
MCH: 31.1 pg (ref 26.0–34.0)
MCHC: 35.1 g/dL (ref 30.0–36.0)
MCV: 88.4 fL (ref 78.0–100.0)
Monocytes Absolute: 0.6 10*3/uL (ref 0.1–1.0)
Monocytes Relative: 11 % (ref 3–12)
NEUTROS PCT: 71 % (ref 43–77)
Neutro Abs: 4 10*3/uL (ref 1.7–7.7)
Platelets: 258 10*3/uL (ref 150–400)
RBC: 4.83 MIL/uL (ref 4.22–5.81)
RDW: 12.7 % (ref 11.5–15.5)
WBC: 5.6 10*3/uL (ref 4.0–10.5)

## 2014-04-05 LAB — TSH: TSH: 1.774 u[IU]/mL (ref 0.350–4.500)

## 2014-04-19 NOTE — Progress Notes (Signed)
LMVM to CB to schedule a six month follow up (April) with Dr. Katrinka BlazingSmith.

## 2014-04-23 ENCOUNTER — Telehealth: Payer: Self-pay | Admitting: Family Medicine

## 2014-04-23 NOTE — Telephone Encounter (Signed)
LMOM to CB. 

## 2014-04-23 NOTE — Telephone Encounter (Signed)
-----   Message from Ethelda ChickKristi M Smith, MD sent at 04/15/2014  9:28 AM EDT ----- Please schedule pt a follow-up appointment in six months for hypertension

## 2014-05-04 NOTE — Progress Notes (Signed)
LMVM for pt to CB to schedule an April Appt with Dr. Katrinka BlazingSmith to follow up on BP.

## 2014-05-06 NOTE — Progress Notes (Signed)
Appointment scheduled with Dr. Katrinka BlazingSmith for 10/06/14 @ 3:00

## 2014-08-25 ENCOUNTER — Ambulatory Visit: Payer: BC Managed Care – PPO | Admitting: Family Medicine

## 2014-09-29 ENCOUNTER — Other Ambulatory Visit: Payer: Self-pay | Admitting: Family Medicine

## 2014-10-06 ENCOUNTER — Ambulatory Visit: Payer: BC Managed Care – PPO | Admitting: Family Medicine

## 2014-12-29 ENCOUNTER — Other Ambulatory Visit: Payer: Self-pay | Admitting: Physician Assistant

## 2015-01-30 ENCOUNTER — Ambulatory Visit (INDEPENDENT_AMBULATORY_CARE_PROVIDER_SITE_OTHER): Payer: BC Managed Care – PPO | Admitting: Emergency Medicine

## 2015-01-30 VITALS — BP 132/76 | HR 82 | Temp 97.8°F | Resp 16 | Ht 74.0 in | Wt 214.0 lb

## 2015-01-30 DIAGNOSIS — I10 Essential (primary) hypertension: Secondary | ICD-10-CM | POA: Diagnosis not present

## 2015-01-30 MED ORDER — AMLODIPINE BESYLATE 5 MG PO TABS: 5.0000 mg | ORAL_TABLET | Freq: Every day | ORAL | Status: DC

## 2015-01-30 MED ORDER — LISINOPRIL-HYDROCHLOROTHIAZIDE 20-25 MG PO TABS: 1.0000 | ORAL_TABLET | Freq: Every day | ORAL | Status: DC

## 2015-01-30 NOTE — Progress Notes (Signed)
Subjective:  Patient ID: Fred Graham, male    DOB: 1951-04-21  Age: 64 y.o. MRN: [id number]  CC: Medication Refill   HPI Fred Graham presents   With hypertension. He has need of refill of his medication. He is tolerating his medication well with no adverse effect. He has no and organ injury evident there is no chest pain shortness breath wheezing or peripheral edema or other complaints. Is not had any lab work done in over a year so going get that done today  History Fred Graham has a past medical history of Hypertension; Allergy; and Arthritis.   He has no past surgical history on file.   His  family history includes Cancer in his brother; Depression in his father; Heart disease (age of onset: 36) in his father; Heart disease (age of onset: 24) in his brother.  He   reports that he has quit smoking. He has never used smokeless tobacco. He reports that he drinks about 8.4 oz of alcohol per week. He reports that he does not use illicit drugs.  Outpatient Prescriptions Prior to Visit  Medication Sig Dispense Refill  . amLODipine (NORVASC) 5 MG tablet TAKE 1 TABLET BY MOUTH ONCE DAILY. "OV NEEDED" 30 tablet 0  . lisinopril-hydrochlorothiazide (PRINZIDE,ZESTORETIC) 20-25 MG per tablet TAKE 1 TABLET BY MOUTH ONCE DAILY.  "OV NEEDED" 30 tablet 0   No facility-administered medications prior to visit.    Social History   Social History  . Marital Status: Divorced    Spouse Name: N/A  . Number of Children: N/A  . Years of Education: N/A   Social History Main Topics  . Smoking status: Former Games developer  . Smokeless tobacco: Never Used  . Alcohol Use: 8.4 oz/week    14 Cans of beer per week  . Drug Use: No  . Sexual Activity: No   Other Topics Concern  . None   Social History Narrative   Marital status: divorced.      Children: 1 daughter; 4 grandchildren.      Employment: retired from [employer] in 2010; odd jobs.      Tobacco: former smoker.   Alcohol:  4 beers per day.      Exercise: walking.     Review of Systems  Constitutional: Negative for fever, chills and appetite change.  HENT: Negative for congestion, ear pain, postnasal drip, sinus pressure and sore throat.   Eyes: Negative for pain and redness.  Respiratory: Negative for cough, shortness of breath and wheezing.   Cardiovascular: Negative for leg swelling.  Gastrointestinal: Negative for nausea, vomiting, abdominal pain, diarrhea, constipation and blood in stool.  Endocrine: Negative for polyuria.  Genitourinary: Negative for dysuria, urgency, frequency and flank pain.  Musculoskeletal: Negative for gait problem.  Skin: Negative for rash.  Neurological: Negative for weakness and headaches.  Psychiatric/Behavioral: Negative for confusion and decreased concentration. The patient is not nervous/anxious.     Objective:  BP 132/76 mmHg  Pulse 82  Temp(Src) 97.8 F (36.6 C) (Oral)  Resp 16  Ht 6\' 2"  (1.88 m)  Wt 214 lb (97.07 kg)  BMI 27.46 kg/m2  SpO2 98%  Physical Exam  Constitutional: He is oriented to person, place, and time. He appears well-developed and well-nourished.  HENT:  Head: Normocephalic and atraumatic.  Eyes: Conjunctivae are normal. Pupils are equal, round, and reactive to light.  Pulmonary/Chest: Effort normal.  Musculoskeletal: He exhibits no edema.  Neurological: He is alert and oriented to person, place, and  time.  Skin: Skin is dry.  Psychiatric: He has a normal mood and affect. His behavior is normal. Thought content normal.      Assessment & Plan:   Fred Graham was seen today for medication refill.  Diagnoses and all orders for this visit:  Essential hypertension -     CBC -     Comprehensive metabolic panel -     Lipid panel  Other orders -     lisinopril-hydrochlorothiazide (PRINZIDE,ZESTORETIC) 20-25 MG per tablet; Take 1 tablet by mouth daily. TAKE 1 TABLET BY MOUTH ONCE DAILY. -     amLODipine (NORVASC) 5 MG tablet; Take 1  tablet (5 mg total) by mouth daily. TAKE 1 TABLET BY MOUTH ONCE DAILY.   I have changed Fred Graham lisinopril-hydrochlorothiazide and amLODipine.  Meds ordered this encounter  Medications  . lisinopril-hydrochlorothiazide (PRINZIDE,ZESTORETIC) 20-25 MG per tablet    Sig: Take 1 tablet by mouth daily. TAKE 1 TABLET BY MOUTH ONCE DAILY.    Dispense:  30 tablet    Refill:  5  . amLODipine (NORVASC) 5 MG tablet    Sig: Take 1 tablet (5 mg total) by mouth daily. TAKE 1 TABLET BY MOUTH ONCE DAILY.    Dispense:  30 tablet    Refill:  5    Appropriate red flag conditions were discussed with the patient as well as actions that should be taken.  Patient expressed his understanding.  Follow-up: Return in about 6 months (around 08/02/2015).  Carmelina Dane, MD

## 2015-01-30 NOTE — Patient Instructions (Signed)

## 2015-01-31 LAB — CBC
HEMATOCRIT: 40.6 % (ref 39.0–52.0)
Hemoglobin: 14.1 g/dL (ref 13.0–17.0)
MCH: 30.6 pg (ref 26.0–34.0)
MCHC: 34.7 g/dL (ref 30.0–36.0)
MCV: 88.1 fL (ref 78.0–100.0)
MPV: 8.9 fL (ref 8.6–12.4)
PLATELETS: 245 10*3/uL (ref 150–400)
RBC: 4.61 MIL/uL (ref 4.22–5.81)
RDW: 13.2 % (ref 11.5–15.5)
WBC: 4.3 10*3/uL (ref 4.0–10.5)

## 2015-01-31 LAB — COMPREHENSIVE METABOLIC PANEL
ALT: 14 U/L (ref 9–46)
AST: 17 U/L (ref 10–35)
Albumin: 4.3 g/dL (ref 3.6–5.1)
Alkaline Phosphatase: 46 U/L (ref 40–115)
BILIRUBIN TOTAL: 0.6 mg/dL (ref 0.2–1.2)
BUN: 11 mg/dL (ref 7–25)
CO2: 28 mmol/L (ref 20–31)
Calcium: 9.8 mg/dL (ref 8.6–10.3)
Chloride: 94 mmol/L — ABNORMAL LOW (ref 98–110)
Creat: 0.94 mg/dL (ref 0.70–1.25)
GLUCOSE: 93 mg/dL (ref 65–99)
POTASSIUM: 4.1 mmol/L (ref 3.5–5.3)
Sodium: 133 mmol/L — ABNORMAL LOW (ref 135–146)
Total Protein: 6.8 g/dL (ref 6.1–8.1)

## 2015-01-31 LAB — LIPID PANEL
CHOLESTEROL: 203 mg/dL — AB (ref 125–200)
HDL: 85 mg/dL (ref >=40)
LDL Cholesterol: 105 mg/dL (ref <130)
Total CHOL/HDL Ratio: 2.4 Ratio (ref <=5.0)
Triglycerides: 63 mg/dL (ref <150)
VLDL: 13 mg/dL (ref <30)

## 2015-07-25 ENCOUNTER — Other Ambulatory Visit: Payer: Self-pay | Admitting: Emergency Medicine

## 2015-08-28 ENCOUNTER — Ambulatory Visit (INDEPENDENT_AMBULATORY_CARE_PROVIDER_SITE_OTHER): Payer: Medicare Other | Admitting: Family Medicine

## 2015-08-28 VITALS — BP 134/78 | HR 96 | Temp 98.2°F | Resp 16 | Ht 74.25 in | Wt 218.4 lb

## 2015-08-28 DIAGNOSIS — I10 Essential (primary) hypertension: Secondary | ICD-10-CM | POA: Diagnosis not present

## 2015-08-28 DIAGNOSIS — L723 Sebaceous cyst: Secondary | ICD-10-CM | POA: Diagnosis not present

## 2015-08-28 DIAGNOSIS — Z79899 Other long term (current) drug therapy: Secondary | ICD-10-CM

## 2015-08-28 MED ORDER — LISINOPRIL-HYDROCHLOROTHIAZIDE 20-25 MG PO TABS: 1.0000 | ORAL_TABLET | Freq: Every day | ORAL | Status: DC

## 2015-08-28 MED ORDER — AMLODIPINE BESYLATE 5 MG PO TABS: 5.0000 mg | ORAL_TABLET | Freq: Every day | ORAL | Status: DC

## 2015-08-28 NOTE — Progress Notes (Signed)
Procedure note: I&D  The patient was placed in prone position to expose the sebaceous cyst on his back. An alcohol prep pad was used to disinfect the area and 5mL of 2% lidocaine/epinephrine was injected into the tissue. Following this, the area and cyst was sterilized with iodine swabs. A horizontal incision was made along the skin lines with 15 blade scalpel roughly 5cm in length. The incision was made about 1/4 inch deep to avoid entering the cyst cavity. The cyst cavity was entered and there was thick pustular drainage removed from the cavity. The cyst capsule was identified and using a curved hemostat the cyst capsule was slowly separated from the skin. Due to tight adhesions, the scalpel, curved hemostats and scissors were also used to removed the capsule from the skin. Once the capsule was successfully removed and the additional blood was wiped with gauze, checking for full removal of the cyst, pustular drainage and the pearly connective tissue capsule, the wound closure was started. A 5-0 Ethilon FS2 suture was used to place 3 horizontal mattress across the incision site. Two simple interrupted sutures were placed on the lateral ends of the horizontal mattress sutures to finish the wound closure. The patient was cleaned with water and 4X4 gauze and a non-adherent bandage was placed on top of the wound and sealed with a hypafix dressing.

## 2015-08-28 NOTE — Progress Notes (Addendum)
I was directly involved in the patient's care and actively participated during the procedure.  Please see procedure note written by student.

## 2015-08-28 NOTE — Progress Notes (Signed)
Subjective:    Patient ID: Fred Graham, male    DOB: October 13, 1950, 65 y.o.   MRN: [id number] Chief Complaint  Patient presents with  . Medication Refill    amlodipine and lisinopril    HPI  Fred Graham is a delightful 65 yo male here today for a refill on his chronic BP meds. No complaints, tol meds well.  Has been trying to eat healthier and smaller portions.  Checks his BP at home and ranges 120s/70s.    Pt has a mass on the middle of the back that as been slowly increasing in size. Non-tender, non-draining.  Past Medical History  Diagnosis Date  . Hypertension   . Allergy   . Arthritis     hands, hips   No past surgical history on file. No current outpatient prescriptions on file prior to visit.   No current facility-administered medications on file prior to visit.   Allergies  Allergen Reactions  . Penicillins     Rash    Family History  Problem Relation Age of Onset  . Cancer Brother     skin cancer  . Heart disease Brother 27    cardiac stenting  . Depression Father   . Heart disease Father 29    CABG   Social History   Social History  . Marital Status: Divorced    Spouse Name: N/A  . Number of Children: N/A  . Years of Education: N/A   Social History Main Topics  . Smoking status: Former Games developer  . Smokeless tobacco: Never Used  . Alcohol Use: 8.4 oz/week    14 Cans of beer per week  . Drug Use: No  . Sexual Activity: No   Other Topics Concern  . None   Social History Narrative   Marital status: divorced.      Children: 1 daughter; 4 grandchildren.      Employment: retired from [employer] in 2010; odd jobs.      Tobacco: former smoker.       Alcohol:  4 beers per day.      Exercise: walking.     Review of Systems  Constitutional: Negative for fever, chills, activity change and appetite change.  Eyes: Negative for visual disturbance.  Respiratory: Negative for shortness of breath.   Cardiovascular: Negative for  chest pain and leg swelling.  Neurological: Negative for dizziness, syncope, facial asymmetry, weakness, light-headedness and headaches.       Objective:  BP 134/78 mmHg  Pulse 96  Temp(Src) 98.2 F (36.8 C) (Oral)  Resp 16  Ht 6' 2.25" (1.886 m)  Wt 218 lb 6.4 oz (99.066 kg)  BMI 27.85 kg/m2  SpO2 98%  Physical Exam  Constitutional: He is oriented to person, place, and time. He appears well-developed and well-nourished. No distress.  HENT:  Head: Normocephalic and atraumatic.  Eyes: Conjunctivae are normal. Pupils are equal, round, and reactive to light. No scleral icterus.  Neck: Normal range of motion. Neck supple. No thyromegaly present.  Cardiovascular: Normal rate, regular rhythm, normal heart sounds and intact distal pulses.   Pulmonary/Chest: Effort normal and breath sounds normal. No respiratory distress.  Musculoskeletal: He exhibits no edema.  Lymphadenopathy:    He has no cervical adenopathy.  Neurological: He is alert and oriented to person, place, and time.  Skin: Skin is warm and dry. Lesion noted. He is not diaphoretic.  Well defined sebaceous cyst on center of thoracic back, crossing midline. Approx 4 cm dm,  mobile, soft, non-tender, no erythema or warmth.  Psychiatric: He has a normal mood and affect. His behavior is normal.       Assessment & Plan:   1. Essential hypertension, benign - refilled meds, cont tp monitor outside office, cont heart healthy diet  2. Medication management   3. Sebaceous cyst - s/p excision by Weber PA-C today   Rec to pt that he come in for his "Welcome to Medicare" visit prior to 08/2016  Orders Placed This Encounter  Procedures  . Basic metabolic panel    Order Specific Question:  Has the patient fasted?    Answer:  No    Meds ordered this encounter  Medications  . amLODipine (NORVASC) 5 MG tablet    Sig: Take 1 tablet (5 mg total) by mouth daily.    Dispense:  90 tablet    Refill:  3    Office visit needed for  refills  . lisinopril-hydrochlorothiazide (PRINZIDE,ZESTORETIC) 20-25 MG tablet    Sig: Take 1 tablet by mouth daily.    Dispense:  90 tablet    Refill:  3    Office visit needed for refills     Fred SorensonEva Angala Hilgers, MD MPH

## 2015-08-28 NOTE — Patient Instructions (Addendum)
Please replace the dressing if it becomes saturated, leaks or falls off, of it it gets wet with bathing.  Please replace the dressing at least once every 24 hours.     It is ok to bath just make sure the wound is dry afterwards and you replace the dressing.  Start or Continue warm compresses - dry heat is the best as it will not get your dressing wet.  Recheck as directed or if you have any concerns.   IF you received an x-ray today, you will receive an invoice from [facility]. Please contact [facility] at [phone] with questions or concerns regarding your invoice.   IF you received labwork today, you will receive an invoice from [facility]. Please contact [facility] at [phone] with questions or concerns regarding your invoice.   Our billing staff will not be able to assist you with questions regarding bills from these companies.  You will be contacted with the lab results as soon as they are available. The fastest way to get your results is to activate your My Chart account. Instructions are located on the last page of this paperwork. If you have not heard from us regarding the results in 2 weeks, please contact this office.   Make sure you come in for your "Welcome To Medicare" office visit at no cost to you prior to August 16, 2016  Heart-Healthy Eating Plan Many factors influence your heart health, including eating and exercise habits. Heart (coronary) risk increases with abnormal blood fat (lipid) levels. Heart-healthy meal planning includes limiting unhealthy fats, increasing healthy fats, and making other small dietary changes. This includes maintaining a healthy body weight to help keep lipid levels within a normal range. WHAT IS MY PLAN?  Your health care provider recommends that you:  Get no more than _________% of the total calories in your daily diet from fat.  Limit your intake of saturated fat to less than _________% of your  total calories each day.  Limit the amount of cholesterol in your diet to less than _________ mg per day. WHAT TYPES OF FAT SHOULD I CHOOSE?  Choose healthy fats more often. Choose monounsaturated and polyunsaturated fats, such as olive oil and canola oil, flaxseeds, walnuts, almonds, and seeds.  Eat more omega-3 fats. Good choices include salmon, mackerel, sardines, tuna, flaxseed oil, and ground flaxseeds. Aim to eat fish at least two times each week.  Limit saturated fats. Saturated fats are primarily found in animal products, such as meats, butter, and cream. Plant sources of saturated fats include palm oil, palm kernel oil, and coconut oil.  Avoid foods with partially hydrogenated oils in them. These contain trans fats. Examples of foods that contain trans fats are stick margarine, some tub margarines, cookies, crackers, and other baked goods. WHAT GENERAL GUIDELINES DO I NEED TO FOLLOW?  Check food labels carefully to identify foods with trans fats or high amounts of saturated fat.  Fill one half of your plate with vegetables and green salads. Eat 4-5 servings of vegetables per day. A serving of vegetables equals 1 cup of raw leafy vegetables,  cup of raw or cooked cut-up vegetables, or  cup of vegetable juice.  Fill one fourth of your plate with whole grains. Look for the word "whole" as the first word in the ingredient list.  Fill one fourth of your plate with lean protein foods.  Eat 4-5 servings of fruit per day. A serving of fruit equals one medium whole fruit,  cup of dried fruit,  cup of fresh, frozen, or canned fruit, or  cup of 100% fruit juice.  Eat more foods that contain soluble fiber. Examples of foods that contain this type of fiber are apples, broccoli, carrots, beans, peas, and barley. Aim to get 20-30 g of fiber per day.  Eat more home-cooked food and less restaurant, buffet, and fast food.  Limit or avoid alcohol.  Limit foods that are high in starch and  sugar.  Avoid fried foods.  Cook foods by using methods other than frying. Baking, boiling, grilling, and broiling are all great options. Other fat-reducing suggestions include:  Removing the skin from poultry.  Removing all visible fats from meats.  Skimming the fat off of stews, soups, and gravies before serving them.  Steaming vegetables in water or broth.  Lose weight if you are overweight. Losing just 5-10% of your initial body weight can help your overall health and prevent diseases such as diabetes and heart disease.  Increase your consumption of nuts, legumes, and seeds to 4-5 servings per week. One serving of dried beans or legumes equals  cup after being cooked, one serving of nuts equals 1 ounces, and one serving of seeds equals  ounce or 1 tablespoon.  You may need to monitor your salt (sodium) intake, especially if you have high blood pressure. Talk with your health care provider or dietitian to get more information about reducing sodium. WHAT FOODS CAN I EAT? Grains Breads, including [place], white, pita, wheat, raisin, rye, oatmeal, and [place]. Tortillas that are neither fried nor made with lard or trans fat. Low-fat rolls, including hotdog and hamburger buns and English muffins. Biscuits. Muffins. Waffles. Pancakes. Light popcorn. Whole-grain cereals. Flatbread. Melba toast. Pretzels. Breadsticks. Rusks. Low-fat snacks and crackers, including oyster, saltine, matzo, graham, animal, and rye. Rice and pasta, including brown rice and those that are made with whole wheat. Vegetables All vegetables. Fruits All fruits, but limit coconut. Meats and Other Protein Sources Lean, well-trimmed beef, veal, pork, and lamb. Chicken and [place] without skin. All fish and shellfish. Wild duck, rabbit, pheasant, and venison. Egg whites or low-cholesterol egg substitutes. Dried beans, peas, lentils, and tofu.Seeds and most nuts. Dairy Low-fat or nonfat cheeses, including ricotta, string,  and mozzarella. Skim or 1% milk that is liquid, powdered, or evaporated. Buttermilk that is made with low-fat milk. Nonfat or low-fat yogurt. Beverages Mineral water. Diet carbonated beverages. Sweets and Desserts Sherbets and fruit ices. Honey, jam, marmalade, jelly, and syrups. Meringues and gelatins. Pure sugar candy, such as hard candy, jelly beans, gumdrops, mints, marshmallows, and small amounts of dark chocolate. [employer]. Eat all sweets and desserts in moderation. Fats and Oils Nonhydrogenated (trans-free) margarines. Vegetable oils, including soybean, sesame, sunflower, olive, peanut, safflower, corn, canola, and cottonseed. Salad dressings or mayonnaise that are made with a vegetable oil. Limit added fats and oils that you use for cooking, baking, salads, and as spreads. Other Cocoa powder. Coffee and tea. All seasonings and condiments. The items listed above may not be a complete list of recommended foods or beverages. Contact your dietitian for more options. WHAT FOODS ARE NOT RECOMMENDED? Grains Breads that are made with saturated or trans fats, oils, or whole milk. Croissants. Butter rolls. Cheese breads. Sweet rolls. Donuts. Buttered popcorn. Chow mein noodles. High-fat crackers, such as cheese or butter crackers. Meats and Other Protein Sources Fatty meats, such as hotdogs, short ribs, sausage, spareribs, bacon, ribeye roast or steak, and mutton. High-fat deli meats, such as  salami and bologna. Caviar. Domestic duck and goose. Organ meats, such as kidney, liver, sweetbreads, brains, gizzard, chitterlings, and heart. Dairy Cream, sour cream, cream cheese, and creamed cottage cheese. Whole milk cheeses, including blue (bleu), [address], [place], [place], [address], [place], [address], [place], [place], and [place]. Whole or 2% milk that is liquid, evaporated, or condensed. Whole buttermilk. Cream sauce or high-fat cheese sauce. Yogurt that is made from whole  milk. Beverages Regular sodas and drinks with added sugar. Sweets and Desserts Frosting. Pudding. Cookies. Cakes other than angel food cake. Candy that has milk chocolate or white chocolate, hydrogenated fat, butter, coconut, or unknown ingredients. Buttered syrups. Full-fat ice cream or ice cream drinks. Fats and Oils Gravy that has suet, meat fat, or shortening. Cocoa butter, hydrogenated oils, palm oil, coconut oil, palm kernel oil. These can often be found in baked products, candy, fried foods, nondairy creamers, and whipped toppings. Solid fats and shortenings, including bacon fat, salt pork, lard, and butter. Nondairy cream substitutes, such as coffee creamers and sour cream substitutes. Salad dressings that are made of unknown oils, cheese, or sour cream. The items listed above may not be a complete list of foods and beverages to avoid. Contact your dietitian for more information.   This information is not intended to replace advice given to you by your health care provider. Make sure you discuss any questions you have with your health care provider.   Document Released: 03/13/2008 Document Revised: 06/25/2014 Document Reviewed: 11/26/2013 Elsevier Interactive Patient Education [employer]

## 2015-08-29 ENCOUNTER — Encounter: Payer: Self-pay | Admitting: Family Medicine

## 2015-08-29 LAB — BASIC METABOLIC PANEL
BUN: 9 mg/dL (ref 7–25)
CHLORIDE: 93 mmol/L — AB (ref 98–110)
CO2: 27 mmol/L (ref 20–31)
CREATININE: 0.75 mg/dL (ref 0.70–1.25)
Calcium: 9.6 mg/dL (ref 8.6–10.3)
Glucose, Bld: 94 mg/dL (ref 65–99)
POTASSIUM: 3.9 mmol/L (ref 3.5–5.3)
SODIUM: 132 mmol/L — AB (ref 135–146)

## 2015-09-06 ENCOUNTER — Ambulatory Visit (INDEPENDENT_AMBULATORY_CARE_PROVIDER_SITE_OTHER): Payer: BC Managed Care – PPO | Admitting: Physician Assistant

## 2015-09-06 DIAGNOSIS — S21209A Unspecified open wound of unspecified back wall of thorax without penetration into thoracic cavity, initial encounter: Secondary | ICD-10-CM

## 2015-09-06 NOTE — Patient Instructions (Signed)
Please keep the packing in place. You will need to return if you experience increased pain, swelling, fever, nausea, dizziness, pus drainage.

## 2015-09-07 ENCOUNTER — Encounter: Payer: Self-pay | Admitting: Physician Assistant

## 2015-09-07 ENCOUNTER — Ambulatory Visit (INDEPENDENT_AMBULATORY_CARE_PROVIDER_SITE_OTHER): Payer: BC Managed Care – PPO | Admitting: Physician Assistant

## 2015-09-07 VITALS — BP 130/80 | HR 104 | Temp 98.0°F | Resp 18 | Ht 74.0 in

## 2015-09-07 DIAGNOSIS — Z5189 Encounter for other specified aftercare: Secondary | ICD-10-CM

## 2015-09-07 NOTE — Progress Notes (Signed)
   09/07/2015 2:04 PM   DOB: 12/19/1950 / MRN: [id number]  SUBJECTIVE:  Fred Graham is a 65 y.o. male presenting for wound recheck s/p I&D yesterday.  Reports the wound is filling up the bandage with blood and he is worried about this.  He takes ASA 81 daily.    He is allergic to penicillins.   He  has a past medical history of Hypertension; Allergy; and Arthritis.    He  reports that he has quit smoking. He has never used smokeless tobacco. He reports that he drinks about 8.4 oz of alcohol per week. He reports that he does not use illicit drugs. He  reports that he does not engage in sexual activity. The patient  has no past surgical history on file.  His family history includes Cancer in his brother; Depression in his father; Heart disease (age of onset: 263) in his father; Heart disease (age of onset: 2764) in his brother.  Review of Systems  Cardiovascular: Negative for chest pain.  Gastrointestinal: Negative for abdominal pain.  Neurological: Negative for headaches.    Problem list and medications reviewed and updated by myself where necessary, and exist elsewhere in the encounter.   OBJECTIVE:  BP 130/80 mmHg  Pulse 104  Temp(Src) 98 F (36.7 C) (Oral)  Resp 18  Ht 6\' 2"  (1.88 m)  SpO2 99%  Physical Exam  Constitutional: He is oriented to person, place, and time. He appears well-developed. He does not appear ill.  Eyes: Conjunctivae and EOM are normal. Pupils are equal, round, and reactive to light.  Pulmonary/Chest: Effort normal.  Abdominal: He exhibits no distension.  Musculoskeletal: Normal range of motion.  Neurological: He is alert and oriented to person, place, and time. No cranial nerve deficit. Coordination normal.  Skin: Skin is warm and dry. He is not diaphoretic.     Psychiatric: He has a normal mood and affect.  Nursing note and vitals reviewed.   No results found for this or any previous visit (from the past 72 hour(s)).  No results found.  ASSESSMENT  AND PLAN  Simona Huharl was seen today for wound check.  Diagnoses and all orders for this visit:  Encounter for wound care: Wound repacked.  Advised f/u in two days.  Sooner if needed.     The patient was advised to call or return to clinic if he does not see an improvement in symptoms or to seek the care of the closest emergency department if he worsens with the above plan.   Deliah BostonMichael Quinterrius Errington, MHS, PA-C [facility] 09/07/2015 2:04 PM

## 2015-09-07 NOTE — Progress Notes (Signed)
[facility] [address], [place] [id number] [phone]  Date:  09/06/2015   Name:  Fred Graham   DOB:  12/06/1950   MRN:  [id number]  PCP:  Tally DueGUEST, CHRIS WARREN, MD    History of Present Illness:  Fred Graham is a 65 y.o. male patient who [facility].  Patient has sebaceous cyst removed in 9 days ago. Patient reports no pain to the wound. There is no increased drainage, swelling, redness, pain, or fever that he can report. Patient has not seen the wound at all. He states he has not changed the dressing.      Patient Active Problem List   Diagnosis Date Noted  . Essential hypertension, benign 02/22/2013    Past Medical History  Diagnosis Date  . Hypertension   . Allergy   . Arthritis     hands, hips    History reviewed. No pertinent past surgical history.  Social History  Substance Use Topics  . Smoking status: Former Games developermoker  . Smokeless tobacco: Never Used  . Alcohol Use: 8.4 oz/week    14 Cans of beer per week    Family History  Problem Relation Age of Onset  . Cancer Brother     skin cancer  . Heart disease Brother 9264    cardiac stenting  . Depression Father   . Heart disease Father 6663    CABG    Allergies  Allergen Reactions  . Penicillins     Rash     Medication list has been reviewed and updated.  Current Outpatient Prescriptions on File Prior to Visit  Medication Sig Dispense Refill  . amLODipine (NORVASC) 5 MG tablet Take 1 tablet (5 mg total) by mouth daily. 90 tablet 3  . lisinopril-hydrochlorothiazide (PRINZIDE,ZESTORETIC) 20-25 MG tablet Take 1 tablet by mouth daily. (Patient not taking: Reported on 09/06/2015) 90 tablet 3   No current facility-administered medications on file prior to visit.    ROS ROS otherwise unremarkable unless listed above.  Physical Examination: There were no vitals taken for this visit. Ideal Body Weight:    Physical Exam  Constitutional: He is oriented to  person, place, and time. He appears well-developed and well-nourished. No distress.  HENT:  Head: Normocephalic and atraumatic.  Eyes: Conjunctivae and EOM are normal. Pupils are equal, round, and reactive to light.  Cardiovascular: Normal rate.   Pulmonary/Chest: Effort normal and breath sounds normal. No respiratory distress.  Neurological: He is alert and oriented to person, place, and time.  Skin: Skin is warm and dry. He is not diaphoretic.  Horizontal mattress sutures are still in place. 3 counted. There is localized swelling and fluctuance to the area. This is not warm to the touch. There is no tenderness to this area. Horizontal mattress sutures removed without difficulty. Firm pressure placed upon the wound where significant clotted blood was removed from the wound. Laceration dehisced upon movement opening a 2cm gape where clotted blood generously expressed. This was cleansed with povidone-iodine swabbing.  Half packing applied into the wound without pain or difficulty.  Dressing applied.  Psychiatric: He has a normal mood and affect. His behavior is normal.     Assessment and Plan: Fred Graham is a 65 y.o. male who is here today for suture removal. Packing placed. Wound depth is vast. Advised to return in 2 days for follow up. At this time and will need to be treated and followed with packing  to heal by secondary intentions. Wound care and treatment plan shared with Dr. Merla Riches.  Open wound of back with complication, unspecified laterality, initial encounter   Trena Platt, PA-C [facility] 09/07/2015 8:41 AM

## 2015-11-17 ENCOUNTER — Ambulatory Visit (INDEPENDENT_AMBULATORY_CARE_PROVIDER_SITE_OTHER): Payer: Medicare Other | Admitting: Family Medicine

## 2015-11-17 VITALS — BP 122/78 | HR 82 | Temp 97.5°F | Resp 18 | Ht 74.0 in | Wt 214.4 lb

## 2015-11-17 DIAGNOSIS — J309 Allergic rhinitis, unspecified: Secondary | ICD-10-CM | POA: Diagnosis not present

## 2015-11-17 MED ORDER — FLUTICASONE PROPIONATE 50 MCG/ACT NA SUSP: 2.0000 | Freq: Every day | NASAL | Status: DC

## 2015-11-17 NOTE — Progress Notes (Signed)
   Subjective:    Patient ID: Fred Graham, male    DOB: 03-Aug-1950, 65 y.o.   MRN: [id number]  HPI This is a pleasant 65 yo male who presents today with vertigo several days ago, he noticed when he moved to the left. He has not had any recent episodes. He has had about 2 weeks of nasal congestion, fatigue, itchy/watery eyes and sneezing. Pressure in ears, chronic ringing in left. No cough. No fever. Has tried nasal saline spray without relief. Has tried an over the counter antihistamine (diphenhydramine) a couple of days without relief. Made him sleepy. Is very careful with what he takes due to his HTN.   Past Medical History  Diagnosis Date  . Hypertension   . Allergy   . Arthritis     hands, hips   History reviewed. No pertinent past surgical history. Family History  Problem Relation Age of Onset  . Cancer Brother     skin cancer  . Heart disease Brother 3164    cardiac stenting  . Depression Father   . Heart disease Father 4463    CABG   Social History  Substance Use Topics  . Smoking status: Former Games developermoker  . Smokeless tobacco: Never Used  . Alcohol Use: 8.4 oz/week    14 Cans of beer per week      Review of Systems  Constitutional: Positive for fatigue. Negative for fever.  HENT: Positive for congestion, ear pain (pressure), postnasal drip and sneezing.   Eyes: Positive for redness and itching.  Respiratory: Negative for cough, chest tightness and shortness of breath.   Cardiovascular: Negative for chest pain.  Neurological: Negative for headaches.       Objective:   Physical Exam  Constitutional: He is oriented to person, place, and time. He appears well-developed and well-nourished. No distress.  HENT:  Head: Normocephalic and atraumatic.  Right Ear: External ear normal. Tympanic membrane is retracted.  Left Ear: External ear normal. Tympanic membrane is retracted.  Nose: Mucosal edema, rhinorrhea and septal deviation present.  Mouth/Throat: Uvula is midline.  Posterior oropharyngeal erythema present. No oropharyngeal exudate or posterior oropharyngeal edema.  Cardiovascular: Normal rate, regular rhythm and normal heart sounds.   Pulmonary/Chest: Effort normal and breath sounds normal.  Neurological: He is alert and oriented to person, place, and time.  Skin: Skin is warm and dry. He is not diaphoretic.  Psychiatric: He has a normal mood and affect. His behavior is normal. Judgment and thought content normal.  Vitals reviewed.     BP 122/78 mmHg  Pulse 82  Temp(Src) 97.5 F (36.4 C) (Oral)  Resp 18  Ht 6\' 2"  (1.88 m)  Wt 214 lb 6.4 oz (97.251 kg)  BMI 27.52 kg/m2  SpO2 98%     Assessment & Plan:  1. Allergic rhinitis, unspecified allergic rhinitis type -Provided written and verbal information regarding diagnosis and treatment. - continue saline nasal spray, add daily cetirizine, afrin nasal spray for 3 days - fluticasone (FLONASE) 50 MCG/ACT nasal spray; Place 2 sprays into both nostrils daily.  Dispense: 16 g; Refill: 6 - RTC precautions reviewed  Olean Reeeborah Gessner, FNP-BC  [facility]  11/17/2015 4:24 PM

## 2015-11-17 NOTE — Patient Instructions (Addendum)
Please use Afrin nasal spray twice a day for up to 3 days- use before you use flonase at night,   Take a long acting antihistamine- cetirizine (generic Zyrtec) Allergic Rhinitis Allergic rhinitis is when the mucous membranes in the nose respond to allergens. Allergens are particles in the air that cause your body to have an allergic reaction. This causes you to release allergic antibodies. Through a chain of events, these eventually cause you to release histamine into the blood stream. Although meant to protect the body, it is this release of histamine that causes your discomfort, such as frequent sneezing, congestion, and an itchy, runny nose.  CAUSES Seasonal allergic rhinitis (hay fever) is caused by pollen allergens that may come from grasses, trees, and weeds. Year-round allergic rhinitis (perennial allergic rhinitis) is caused by allergens such as house dust mites, pet dander, and mold spores. SYMPTOMS  Nasal stuffiness (congestion).  Itchy, runny nose with sneezing and tearing of the eyes. DIAGNOSIS Your health care provider can help you determine the allergen or allergens that trigger your symptoms. If you and your health care provider are unable to determine the allergen, skin or blood testing may be used. Your health care provider will diagnose your condition after taking your health history and performing a physical exam. Your health care provider may assess you for other related conditions, such as asthma, pink eye, or an ear infection. TREATMENT Allergic rhinitis does not have a cure, but it can be controlled by:  Medicines that block allergy symptoms. These may include allergy shots, nasal sprays, and oral antihistamines.  Avoiding the allergen. Hay fever may often be treated with antihistamines in pill or nasal spray forms. Antihistamines block the effects of histamine. There are over-the-counter medicines that may help with nasal congestion and swelling around the eyes. Check  with your health care provider before taking or giving this medicine. If avoiding the allergen or the medicine prescribed do not work, there are many new medicines your health care provider can prescribe. Stronger medicine may be used if initial measures are ineffective. Desensitizing injections can be used if medicine and avoidance does not work. Desensitization is when a patient is given ongoing shots until the body becomes less sensitive to the allergen. Make sure you follow up with your health care provider if problems continue. HOME CARE INSTRUCTIONS It is not possible to completely avoid allergens, but you can reduce your symptoms by taking steps to limit your exposure to them. It helps to know exactly what you are allergic to so that you can avoid your specific triggers. SEEK MEDICAL CARE IF:  You have a fever.  You develop a cough that does not stop easily (persistent).  You have shortness of breath.  You start wheezing.  Symptoms interfere with normal daily activities.   This information is not intended to replace advice given to you by your health care provider. Make sure you discuss any questions you have with your health care provider.   Document Released: 02/27/2001 Document Revised: 06/25/2014 Document Reviewed: 02/09/2013 Elsevier Interactive Patient Education 2016 [employer].   IF you received an x-ray today, you will receive an invoice from [facility]. Please contact [facility] at [phone] with questions or concerns regarding your invoice.   IF you received labwork today, you will receive an invoice from [facility]. Please contact [facility] at [phone] with questions or concerns regarding your invoice.   Our billing staff will not be able to assist you with questions regarding  bills from these companies.  You will be contacted with the lab results as soon as they are available. The fastest way to get your results is  to activate your My Chart account. Instructions are located on the last page of this paperwork. If you have not heard from us regarding the results in 2 weeks, please contact this office.

## 2016-04-04 DIAGNOSIS — H2513 Age-related nuclear cataract, bilateral: Secondary | ICD-10-CM | POA: Diagnosis not present

## 2016-04-04 DIAGNOSIS — H3561 Retinal hemorrhage, right eye: Secondary | ICD-10-CM | POA: Diagnosis not present

## 2016-04-04 DIAGNOSIS — H25041 Posterior subcapsular polar age-related cataract, right eye: Secondary | ICD-10-CM | POA: Diagnosis not present

## 2016-04-10 ENCOUNTER — Ambulatory Visit (INDEPENDENT_AMBULATORY_CARE_PROVIDER_SITE_OTHER): Payer: BC Managed Care – PPO | Admitting: Physician Assistant

## 2016-04-10 VITALS — BP 142/88 | HR 95 | Temp 98.4°F | Resp 17 | Ht 74.0 in | Wt 217.0 lb

## 2016-04-10 DIAGNOSIS — J029 Acute pharyngitis, unspecified: Secondary | ICD-10-CM | POA: Diagnosis not present

## 2016-04-10 LAB — POCT RAPID STREP A (OFFICE): RAPID STREP A SCREEN: NEGATIVE

## 2016-04-10 MED ORDER — ACETAMINOPHEN 500 MG PO TABS: 1000.0000 mg | ORAL_TABLET | Freq: Three times a day (TID) | ORAL | 99 refills | Status: AC | PRN

## 2016-04-10 MED ORDER — LIDOCAINE VISCOUS 2 % MT SOLN: 5.0000 mL | OROMUCOSAL | 0 refills | Status: DC | PRN

## 2016-04-10 NOTE — Patient Instructions (Signed)
     IF you received an x-ray today, you will receive an invoice from [facility]. Please contact [facility] at [phone] with questions or concerns regarding your invoice.   IF you received labwork today, you will receive an invoice from [facility]. Please contact [facility] at [phone] with questions or concerns regarding your invoice.   Our billing staff will not be able to assist you with questions regarding bills from these companies.  You will be contacted with the lab results as soon as they are available. The fastest way to get your results is to activate your My Chart account. Instructions are located on the last page of this paperwork. If you have not heard from us regarding the results in 2 weeks, please contact this office.      

## 2016-04-10 NOTE — Progress Notes (Signed)
By signing my name below, I, Stann Ore, attest that this documentation has been prepared under the direction and in the presence of Deliah Boston, PA-C. Electronically Signed: Stann Ore, Scribe. 04/10/2016 , 11:26 AM .  Patient was seen in Room 4 .  04/10/2016 11:57 AM   DOB: [birth year] / MRN: [id number]  SUBJECTIVE:  Fred Graham is a 65 y.o. male presenting for sore throat with cough that started 4 days ago. H/o HTN and allergies per [facility]. He has a rash reaction with penicillin. Patient states that he was feeling better at night, but then it returned the next day when he woke up. He started feeling better again, but the next day, it returned again. He thought it was allergies with some eye itching but this has mostly resolved. He reports having some drainage down the back of his throat when he lays down. He denies fever, congestion, sneezing, trouble swallowing, or any new heartburn.   He denies history of diabetes.  He is a former smoker, quit 20 years ago. He denies using oral tobacco products.   He is allergic to penicillins.   He  has a past medical history of Allergy; Arthritis; and Hypertension.    He  reports that he has quit smoking. He has never used smokeless tobacco. He reports that he drinks about 8.4 oz of alcohol per week . He reports that he does not use drugs. He  reports that he does not engage in sexual activity. The patient  has no past surgical history on file.  His family history includes Cancer in his brother; Depression in his father; Heart disease (age of onset: 38) in his father; Heart disease (age of onset: 56) in his brother.  Review of Systems  Constitutional: Negative for fever.  HENT: Positive for sore throat. Negative for congestion.   Respiratory: Positive for cough.   Gastrointestinal: Negative for heartburn.  Neurological: Negative for dizziness.    The problem list and medications were reviewed and updated by myself where necessary and  exist elsewhere in the encounter.   OBJECTIVE:  BP (!) 142/88 (BP Location: Right Arm, Patient Position: Sitting, Cuff Size: Normal)    Pulse 95    Temp 98.4 F (36.9 C) (Oral)    Resp 17    Ht 6\' 2"  (1.88 m)    Wt 217 lb (98.4 kg)    SpO2 99%    BMI 27.86 kg/m   Physical Exam  Constitutional: He is oriented to person, place, and time. Vital signs are normal. He appears well-developed.  HENT:  Right Ear: Tympanic membrane normal.  Left Ear: Tympanic membrane normal.  Nose: Nose normal.  Mouth/Throat: Posterior oropharyngeal erythema present. No oropharyngeal exudate or posterior oropharyngeal edema.  Cardiovascular: Normal rate, regular rhythm and normal heart sounds.   No murmur heard. Pulmonary/Chest: Effort normal and breath sounds normal. No respiratory distress.  Musculoskeletal: Normal range of motion.  Lymphadenopathy:       Head (right side): No tonsillar adenopathy present.       Head (left side): No tonsillar adenopathy present.    He has no cervical adenopathy.  Neurological: He is alert and oriented to person, place, and time.  Skin: Skin is warm and dry.  Psychiatric: He has a normal mood and affect. His behavior is normal. Judgment and thought content normal.    Results for orders placed or performed in visit on 04/10/16 (from the past 72 hour(s))  POCT rapid strep A  Status: None   Collection Time: 04/10/16 11:36 AM  Result Value Ref Range   Rapid Strep A Screen Negative Negative    No results found.   ASSESSMENT AND PLAN  Fred Graham was seen today for sore throat and cough.  Diagnoses and all orders for this visit:  Sore throat: Likely viral.  No alarm symptoms or signs.  Will treat symptomatically pending culture.  -     POCT rapid strep A -     Culture, Group A Strep     The patient is advised to call or return to clinic if he does not see an improvement in symptoms, or to seek the care of the closest emergency department if he worsens with the above  plan.   This note was scribed in my presence and I performed the services described in the this documentation.   Deliah BostonMichael Clark, MHS, PA-C [facility] 04/10/2016 11:57 AM

## 2016-04-11 LAB — CULTURE, GROUP A STREP: Organism ID, Bacteria: NORMAL

## 2016-08-15 ENCOUNTER — Other Ambulatory Visit: Payer: Self-pay | Admitting: Family Medicine

## 2016-09-20 ENCOUNTER — Other Ambulatory Visit: Payer: Self-pay | Admitting: Physician Assistant

## 2016-10-12 DIAGNOSIS — I10 Essential (primary) hypertension: Secondary | ICD-10-CM | POA: Diagnosis not present

## 2016-10-12 DIAGNOSIS — Z760 Encounter for issue of repeat prescription: Secondary | ICD-10-CM | POA: Diagnosis not present

## 2016-10-12 DIAGNOSIS — R5383 Other fatigue: Secondary | ICD-10-CM | POA: Diagnosis not present

## 2016-10-13 ENCOUNTER — Ambulatory Visit: Payer: BC Managed Care – PPO | Admitting: Physician Assistant

## 2016-10-25 ENCOUNTER — Ambulatory Visit (INDEPENDENT_AMBULATORY_CARE_PROVIDER_SITE_OTHER): Payer: Medicare Other | Admitting: Physician Assistant

## 2016-10-25 ENCOUNTER — Encounter: Payer: Self-pay | Admitting: Physician Assistant

## 2016-10-25 ENCOUNTER — Ambulatory Visit (INDEPENDENT_AMBULATORY_CARE_PROVIDER_SITE_OTHER): Payer: Medicare Other

## 2016-10-25 VITALS — BP 142/78 | HR 92 | Temp 98.1°F | Resp 18 | Ht 74.0 in | Wt 213.8 lb

## 2016-10-25 DIAGNOSIS — R1912 Hyperactive bowel sounds: Secondary | ICD-10-CM

## 2016-10-25 DIAGNOSIS — R140 Abdominal distension (gaseous): Secondary | ICD-10-CM | POA: Diagnosis not present

## 2016-10-25 DIAGNOSIS — I10 Essential (primary) hypertension: Secondary | ICD-10-CM

## 2016-10-25 LAB — POCT CBC
GRANULOCYTE PERCENT: 73.6 % (ref 37–80)
HEMATOCRIT: 41.4 % — AB (ref 43.5–53.7)
Hemoglobin: 14.4 g/dL (ref 14.1–18.1)
LYMPH, POC: 1.4 (ref 0.6–3.4)
MCH, POC: 32.6 pg — AB (ref 27–31.2)
MCHC: 34.9 g/dL (ref 31.8–35.4)
MCV: 93.5 fL (ref 80–97)
MID (cbc): 0.2 (ref 0–0.9)
MPV: 6 fL (ref 0–99.8)
POC GRANULOCYTE: 4.4 (ref 2–6.9)
POC LYMPH PERCENT: 22.9 %L (ref 10–50)
POC MID %: 3.5 %M (ref 0–12)
Platelet Count, POC: 270 10*3/uL (ref 142–424)
RBC: 4.42 M/uL — AB (ref 4.69–6.13)
RDW, POC: 12.6 %
WBC: 6 10*3/uL (ref 4.6–10.2)

## 2016-10-25 MED ORDER — AMLODIPINE BESYLATE 5 MG PO TABS: 5.0000 mg | ORAL_TABLET | Freq: Every day | ORAL | 1 refills | Status: DC

## 2016-10-25 MED ORDER — LOPERAMIDE HCL 2 MG PO TABS: 2.0000 mg | ORAL_TABLET | Freq: Four times a day (QID) | ORAL | 0 refills | Status: DC | PRN

## 2016-10-25 MED ORDER — LISINOPRIL-HYDROCHLOROTHIAZIDE 20-25 MG PO TABS: 1.0000 | ORAL_TABLET | Freq: Every day | ORAL | 1 refills | Status: DC

## 2016-10-25 NOTE — Progress Notes (Signed)
10/26/2016 3:52 PM   DOB: [birth year] / MRN: [id number]  SUBJECTIVE:  Fred Graham is a 66 y.o. male presenting for bloating and diarrhea that started 1 week ago.  The symptoms are waxing and waning.  Feels that his is "just full of air."  He denies fecal urgency.  He denies pencil thin stools.  He denies blood in his stool. He denies nausea in the last two weeks but does report a few episodes of sour stomach shortly before all this started. No rash on his abdomen. He is a non drinker. Former smoker quit 25 years ago.  No dysphagia or odynophagia.  No abx in the last six weeks. He is retired.   No chest, sudden changes in vision, HA, leg swelling, no orthopnea.  He would like a refill of his BP medication today.   He is allergic to penicillins.   He  has a past medical history of Allergy; Arthritis; and Hypertension.    He  reports that he has quit smoking. He has never used smokeless tobacco. He reports that he drinks about 8.4 oz of alcohol per week . He reports that he does not use drugs. He  reports that he does not engage in sexual activity. The patient  has no past surgical history on file.  His family history includes Cancer in his brother; Depression in his father; Heart disease (age of onset: 104) in his father; Heart disease (age of onset: 16) in his brother.  Review of Systems  Constitutional: Negative for chills and fever.  Gastrointestinal: Positive for abdominal pain and diarrhea. Negative for blood in stool, heartburn, melena, nausea and vomiting.  Genitourinary: Negative.   Skin: Negative for rash.    The problem list and medications were reviewed and updated by myself where necessary and exist elsewhere in the encounter.   OBJECTIVE:  BP (!) 142/78   Pulse 92   Temp 98.1 F (36.7 C) (Oral)   Resp 18   Ht _0  (1.88 m)   Wt 213 lb 12.8 oz (97 kg)   SpO2 98%   BMI 27.45 kg/m   Physical Exam  Constitutional: He appears well-developed. He is active and cooperative.   Non-toxic appearance.  Cardiovascular: Normal rate, regular rhythm, S1 normal, S2 normal, normal heart sounds, intact distal pulses and normal pulses.  Exam reveals no gallop and no friction rub.   No murmur heard. Pulmonary/Chest: Effort normal. No stridor. No tachypnea. No respiratory distress. He has no wheezes. He has no rales.  Abdominal: Soft. Normal appearance and bowel sounds are normal. He exhibits no distension and no mass. There is no tenderness. There is no rigidity, no rebound, no guarding and no CVA tenderness. No hernia.  Musculoskeletal: He exhibits no edema.  Neurological: He is alert.  Skin: Skin is warm and dry. He is not diaphoretic. No pallor.  Vitals reviewed.   Pulse Readings from Last 3 Encounters:  10/25/16 92  04/10/16 95  11/17/15 82     Wt Readings from Last 3 Encounters:  10/25/16 213 lb 12.8 oz (97 kg)  04/10/16 217 lb (98.4 kg)  11/17/15 214 lb 6.4 oz (97.3 kg)   Lab Results  Component Value Date   WBC 6.0 10/25/2016   HGB 14.4 10/25/2016   HCT 41.4 (A) 10/25/2016   MCV 93.5 10/25/2016   PLT 245 01/30/2015    Lab Results  Component Value Date   NA 129 (L) 10/25/2016   K 4.0 10/25/2016   CL 86 (  L) 10/25/2016   CO2 27 10/25/2016    Lab Results  Component Value Date   CREATININE 0.90 10/25/2016    Lab Results  Component Value Date   ALT 19 10/25/2016   AST 24 10/25/2016   ALKPHOS 45 10/25/2016   BILITOT 0.7 10/25/2016    Lab Results  Component Value Date   TSH 1.480 10/25/2016     Results for orders placed or performed in visit on 10/25/16 (from the past 72 hour(s))  CMP14+EGFR     Status: Abnormal   Collection Time: 10/25/16  4:43 PM  Result Value Ref Range   Glucose 94 65 - 99 mg/dL   BUN 8 8 - 27 mg/dL   Creatinine, Ser 0.90 0.76 - 1.27 mg/dL   GFR calc non Af Amer 89 >59 mL/min/1.73   GFR calc Af Amer 103 >59 mL/min/1.73   BUN/Creatinine Ratio 9 (L) 10 - 24   Sodium 129 (L) 134 - 144 mmol/L   Potassium 4.0 3.5 - 5.2  mmol/L   Chloride 86 (L) 96 - 106 mmol/L   CO2 27 18 - 29 mmol/L   Calcium 10.3 (H) 8.6 - 10.2 mg/dL   Total Protein 7.2 6.0 - 8.5 g/dL   Albumin 4.9 (H) 3.6 - 4.8 g/dL   Globulin, Total 2.3 1.5 - 4.5 g/dL   Albumin/Globulin Ratio 2.1 1.2 - 2.2   Bilirubin Total 0.7 0.0 - 1.2 mg/dL   Alkaline Phosphatase 45 39 - 117 IU/L   AST 24 0 - 40 IU/L   ALT 19 0 - 44 IU/L  TSH     Status: None   Collection Time: 10/25/16  4:43 PM  Result Value Ref Range   TSH 1.480 0.450 - 4.500 uIU/mL  POCT CBC     Status: Abnormal   Collection Time: 10/25/16  5:03 PM  Result Value Ref Range   WBC 6.0 4.6 - 10.2 K/uL   Lymph, poc 1.4 0.6 - 3.4   POC LYMPH PERCENT 22.9 10 - 50 %L   MID (cbc) 0.2 0 - 0.9   POC MID % 3.5 0 - 12 %M   POC Granulocyte 4.4 2 - 6.9   Granulocyte percent 73.6 37 - 80 %G   RBC 4.42 (A) 4.69 - 6.13 M/uL   Hemoglobin 14.4 14.1 - 18.1 g/dL   HCT, POC 41.4 (A) 43.5 - 53.7 %   MCV 93.5 80 - 97 fL   MCH, POC 32.6 (A) 27 - 31.2 pg   MCHC 34.9 31.8 - 35.4 g/dL   RDW, POC 12.6 %   Platelet Count, POC 270 142 - 424 K/uL   MPV 6.0 0 - 99.8 fL    Dg Abd 2 Views  Result Date: 10/25/2016 CLINICAL DATA:  Bloating and diarrhea EXAM: ABDOMEN - 2 VIEW COMPARISON:  None. FINDINGS: The bowel gas pattern is normal. There is no evidence of free air. No radio-opaque calculi or other significant radiographic abnormality is seen. IMPRESSION: Negative. Electronically Signed   By: Ulyses Jarred M.D.   On: 10/25/2016 17:24    ASSESSMENT AND PLAN:  [name] was seen today for bloated and diarrhea.  Diagnoses and all orders for this visit:  Increased bowel sounds: His work up is reassuring.  He needs a colonoscopy however we will revisit this at his 6 month follow up.  BP medication refilled today given lab work.  CMP with several mild abnormalities however no loperamide should help stop diarrhea and thus electrolyte loss.  -  POCT CBC -     CMP14+EGFR -     Cancel: DG Abd 1 View; Future -     DG  Abd 2 Views; Future -     loperamide (IMODIUM A-D) 2 MG tablet; Take 1 tablet (2 mg total) by mouth 4 (four) times daily as needed for diarrhea or loose stools. -     TSH    The patient is advised to call or return to clinic if he does not see an improvement in symptoms, or to seek the care of the closest emergency department if he worsens with the above plan.   Philis Fendt, MHS, PA-C [facility] 10/26/2016 3:52 PM

## 2016-10-25 NOTE — Patient Instructions (Addendum)
Start immodium as directed.  Let me know if you are not getting back to normal in about 1 week.      IF you received an x-ray today, you will receive an invoice from [facility]. Please contact [facility] at [phone] with questions or concerns regarding your invoice.   IF you received labwork today, you will receive an invoice from [facility]. Please contact [facility] at [phone] with questions or concerns regarding your invoice.   Our billing staff will not be able to assist you with questions regarding bills from these companies.  You will be contacted with the lab results as soon as they are available. The fastest way to get your results is to activate your My Chart account. Instructions are located on the last page of this paperwork. If you have not heard from us regarding the results in 2 weeks, please contact this office.

## 2016-10-26 LAB — CMP14+EGFR
ALT: 19 IU/L (ref 0–44)
AST: 24 IU/L (ref 0–40)
Albumin/Globulin Ratio: 2.1 (ref 1.2–2.2)
Albumin: 4.9 g/dL — ABNORMAL HIGH (ref 3.6–4.8)
Alkaline Phosphatase: 45 IU/L (ref 39–117)
BUN/Creatinine Ratio: 9 — ABNORMAL LOW (ref 10–24)
BUN: 8 mg/dL (ref 8–27)
Bilirubin Total: 0.7 mg/dL (ref 0.0–1.2)
CALCIUM: 10.3 mg/dL — AB (ref 8.6–10.2)
CHLORIDE: 86 mmol/L — AB (ref 96–106)
CO2: 27 mmol/L (ref 18–29)
CREATININE: 0.9 mg/dL (ref 0.76–1.27)
GFR calc Af Amer: 103 mL/min/{1.73_m2} (ref >59)
GFR, EST NON AFRICAN AMERICAN: 89 mL/min/{1.73_m2} (ref >59)
GLOBULIN, TOTAL: 2.3 g/dL (ref 1.5–4.5)
GLUCOSE: 94 mg/dL (ref 65–99)
POTASSIUM: 4 mmol/L (ref 3.5–5.2)
Sodium: 129 mmol/L — ABNORMAL LOW (ref 134–144)
Total Protein: 7.2 g/dL (ref 6.0–8.5)

## 2016-10-26 LAB — TSH: TSH: 1.48 u[IU]/mL (ref 0.450–4.500)

## 2016-12-06 ENCOUNTER — Encounter: Payer: Self-pay | Admitting: Physician Assistant

## 2016-12-06 ENCOUNTER — Ambulatory Visit (INDEPENDENT_AMBULATORY_CARE_PROVIDER_SITE_OTHER): Payer: Medicare Other | Admitting: Physician Assistant

## 2016-12-06 VITALS — BP 142/87 | HR 93 | Temp 97.9°F | Resp 18 | Ht 74.29 in | Wt 196.8 lb

## 2016-12-06 DIAGNOSIS — IMO0002 Reserved for concepts with insufficient information to code with codable children: Secondary | ICD-10-CM

## 2016-12-06 DIAGNOSIS — R197 Diarrhea, unspecified: Secondary | ICD-10-CM | POA: Diagnosis not present

## 2016-12-06 DIAGNOSIS — Z658 Other specified problems related to psychosocial circumstances: Secondary | ICD-10-CM | POA: Diagnosis not present

## 2016-12-06 LAB — POCT CBC
GRANULOCYTE PERCENT: 69.7 % (ref 37–80)
HEMATOCRIT: 41.3 % — AB (ref 43.5–53.7)
Hemoglobin: 14.6 g/dL (ref 14.1–18.1)
Lymph, poc: 1.1 (ref 0.6–3.4)
MCH, POC: 32.1 pg — AB (ref 27–31.2)
MCHC: 35.3 g/dL (ref 31.8–35.4)
MCV: 90.9 fL (ref 80–97)
MID (CBC): 0.4 (ref 0–0.9)
MPV: 6.4 fL (ref 0–99.8)
PLATELET COUNT, POC: 246 10*3/uL (ref 142–424)
POC GRANULOCYTE: 3.5 (ref 2–6.9)
POC LYMPH %: 22.5 % (ref 10–50)
POC MID %: 7.8 %M (ref 0–12)
RBC: 4.55 M/uL — AB (ref 4.69–6.13)
RDW, POC: 12.3 %
WBC: 5 10*3/uL (ref 4.6–10.2)

## 2016-12-06 MED ORDER — PSYLLIUM 28 % PO PACK: 1.0000 | PACK | Freq: Every day | ORAL | 11 refills | Status: DC

## 2016-12-06 NOTE — Progress Notes (Signed)
12/06/2016 9:33 PM   DOB: Jul 11, 1950 / MRN: [id number]  SUBJECTIVE:  Fred Graham is a 66 y.o. male presenting for recheck of diarrhea. Tells me that the diarrhea started back about 4 days ago.  He is eating less junk nowadays and made this change about 3-4 days before these symptoms started. He denies abdominal pain today.  He denies fever.  Denies hematochezia and melena.  Has taken some loperamide and this helps.  He is having roughly 1-2 loose stools daily.  Denies fecal urgency.   He feels down today. Tells me that he found his father after the father committed suicide at the age of 62.  Tells me that he, the patient, often feels that life is not worth living, particularly in the heat.  However he has an adopted daughter and would never harm himself because of her and her children.  He sleeps well at night. He has lost several really close friends over the last few years.  He is divorced.  He does not want to try any medications but is willing to come back in a week and talk about things further.  He does not feel unsafe from himself and tells me tearfully "I'm tough goddamnit." He will often dream of finding his father after the suicide. He does own firearms.   Depression screen PHQ 2/9 12/06/2016  Decreased Interest 1  Down, Depressed, Hopeless 1  PHQ - 2 Score 2  Altered sleeping 0  Tired, decreased energy 0  Change in appetite 0  Feeling bad or failure about yourself  1  Trouble concentrating 0  Moving slowly or fidgety/restless 0  Suicidal thoughts 1  PHQ-9 Score 4  Difficult doing work/chores Somewhat difficult     He is allergic to penicillins.   He  has a past medical history of Allergy; Arthritis; and Hypertension.    He  reports that he has quit smoking. He has never used smokeless tobacco. He reports that he drinks about 8.4 oz of alcohol per week . He reports that he does not use drugs. He  reports that he does not engage in sexual activity. The patient  has no  past surgical history on file.  His family history includes Cancer in his brother; Depression in his father; Heart disease (age of onset: 33) in his father; Heart disease (age of onset: 44) in his brother.  Review of Systems  Gastrointestinal: Positive for diarrhea. Negative for abdominal pain, blood in stool, constipation, heartburn, melena, nausea and vomiting.  Psychiatric/Behavioral: Positive for depression. Negative for hallucinations, memory loss and substance abuse. The patient is not nervous/anxious and does not have insomnia.     The problem list and medications were reviewed and updated by myself where necessary and exist elsewhere in the encounter.   OBJECTIVE:  BP (!) 142/87 (BP Location: Right Arm, Patient Position: Sitting, Cuff Size: Normal)   Pulse 93   Temp 97.9 F (36.6 C) (Oral)   Resp 18   Ht 6' 2.29" (1.887 m)   Wt 196 lb 12.8 oz (89.3 kg)   BMI 25.07 kg/m   Physical Exam  Constitutional: He appears well-developed. He is active and cooperative.  Non-toxic appearance.  Cardiovascular: Normal rate.   Pulmonary/Chest: Effort normal. No tachypnea.  Neurological: He is alert.  Skin: Skin is warm and dry. He is not diaphoretic. No pallor.  Psychiatric: His speech is normal. Judgment and thought content normal. His affect is angry. He is withdrawn. He is not slowed.  Cognition and memory are normal. He exhibits a depressed mood. He expresses no suicidal ideation. He expresses no suicidal plans.  Vitals reviewed.    Results for orders placed or performed in visit on 12/06/16 (from the past 72 hour(s))  POCT CBC     Status: Abnormal   Collection Time: 12/06/16  3:01 PM  Result Value Ref Range   WBC 5.0 4.6 - 10.2 K/uL   Lymph, poc 1.1 0.6 - 3.4   POC LYMPH PERCENT 22.5 10 - 50 %L   MID (cbc) 0.4 0 - 0.9   POC MID % 7.8 0 - 12 %M   POC Granulocyte 3.5 2 - 6.9   Granulocyte percent 69.7 37 - 80 %G   RBC 4.55 (A) 4.69 - 6.13 M/uL   Hemoglobin 14.6 14.1 - 18.1 g/dL     HCT, POC 41.3 (A) 43.5 - 53.7 %   MCV 90.9 80 - 97 fL   MCH, POC 32.1 (A) 27 - 31.2 pg   MCHC 35.3 31.8 - 35.4 g/dL   RDW, POC 12.3 %   Platelet Count, POC 246 142 - 424 K/uL   MPV 6.4 0 - 99.8 fL   ASSESSMENT AND PLAN:  Alexx was seen today for diarrhea and depression.  Diagnoses and all orders for this visit:  Diarrhea of presumed infectious origin: Advised he take loperamide to stop the diarrhea. His hemoglobin is stable and CBC actually looks a little better than his last visit.  Will screen for malignancy with a cologaurd.  Will screen for infection with a pathogen panel. CMP pending but I don't expect any abnormalities there.  -     psyllium (METAMUCIL SMOOTH TEXTURE) 28 % packet; Take 1 packet by mouth daily. Start with 1/2 pack daily for three days then move to the full pack daily. -     GI Profile, Stool, PCR -     Cologuard -     POCT CBC -     CMP14+EGFR  Personal problem: See HPI.  He will be coming back in about 7 days to talk again.  He does not want any medication at this time. He has suffered significant loss in his life and even more so with the loss of his three best friends in the last 3 years or so.  He is suffering but is weary of medication.  Hopefully I can find a medication that works for him after we build some further rapport.     The patient is advised to call or return to clinic if he does not see an improvement in symptoms, or to seek the care of the closest emergency department if he worsens with the above plan.   Philis Fendt, MHS, PA-C [facility] 12/06/2016 9:33 PM

## 2016-12-06 NOTE — Patient Instructions (Addendum)
Lets talk again next week.  Let me know if you need anything in the meantime.     IF you received an x-ray today, you will receive an invoice from [facility]. Please contact [facility] at [phone] with questions or concerns regarding your invoice.   IF you received labwork today, you will receive an invoice from [facility]. Please contact [facility] at [phone] with questions or concerns regarding your invoice.   Our billing staff will not be able to assist you with questions regarding bills from these companies.  You will be contacted with the lab results as soon as they are available. The fastest way to get your results is to activate your My Chart account. Instructions are located on the last page of this paperwork. If you have not heard from us regarding the results in 2 weeks, please contact this office.

## 2016-12-07 LAB — CMP14+EGFR
ALBUMIN: 4.7 g/dL (ref 3.6–4.8)
ALK PHOS: 44 IU/L (ref 39–117)
ALT: 17 IU/L (ref 0–44)
AST: 18 IU/L (ref 0–40)
Albumin/Globulin Ratio: 1.9 (ref 1.2–2.2)
BUN / CREAT RATIO: 8 — AB (ref 10–24)
BUN: 7 mg/dL — ABNORMAL LOW (ref 8–27)
Bilirubin Total: 0.5 mg/dL (ref 0.0–1.2)
CALCIUM: 10.3 mg/dL — AB (ref 8.6–10.2)
CO2: 26 mmol/L (ref 20–29)
CREATININE: 0.91 mg/dL (ref 0.76–1.27)
Chloride: 92 mmol/L — ABNORMAL LOW (ref 96–106)
GFR, EST AFRICAN AMERICAN: 101 mL/min/{1.73_m2} (ref >59)
GFR, EST NON AFRICAN AMERICAN: 88 mL/min/{1.73_m2} (ref >59)
GLOBULIN, TOTAL: 2.5 g/dL (ref 1.5–4.5)
Glucose: 92 mg/dL (ref 65–99)
Potassium: 3.8 mmol/L (ref 3.5–5.2)
SODIUM: 135 mmol/L (ref 134–144)
TOTAL PROTEIN: 7.2 g/dL (ref 6.0–8.5)

## 2016-12-09 LAB — GI PROFILE, STOOL, PCR

## 2017-02-05 ENCOUNTER — Telehealth: Payer: Self-pay

## 2017-02-05 NOTE — Telephone Encounter (Signed)
Called pt to schedule medicare awv with nurse health advisor. Pt reported a busy schedule right now and will call back to schedule his awv as well as a follow up with PCP when he can. -nr

## 2017-03-11 DIAGNOSIS — Z1212 Encounter for screening for malignant neoplasm of rectum: Secondary | ICD-10-CM | POA: Diagnosis not present

## 2017-03-11 DIAGNOSIS — Z1211 Encounter for screening for malignant neoplasm of colon: Secondary | ICD-10-CM | POA: Diagnosis not present

## 2017-03-11 LAB — COLOGUARD: COLOGUARD: NEGATIVE

## 2017-03-16 LAB — COLOGUARD: Cologuard: NEGATIVE

## 2017-03-19 ENCOUNTER — Ambulatory Visit (INDEPENDENT_AMBULATORY_CARE_PROVIDER_SITE_OTHER): Payer: BC Managed Care – PPO | Admitting: Physician Assistant

## 2017-03-19 ENCOUNTER — Encounter: Payer: Self-pay | Admitting: Physician Assistant

## 2017-03-19 VITALS — HR 102 | Temp 98.2°F | Resp 16 | Ht 74.29 in | Wt 200.0 lb

## 2017-03-19 DIAGNOSIS — F341 Dysthymic disorder: Secondary | ICD-10-CM

## 2017-03-19 MED ORDER — ESCITALOPRAM OXALATE 5 MG PO TABS: 5.0000 mg | ORAL_TABLET | Freq: Every day | ORAL | 1 refills | Status: DC

## 2017-03-19 NOTE — Progress Notes (Signed)
03/19/2017 3:32 PM   DOB: 14-Jun-1951 / MRN: [id number]  SUBJECTIVE:  Fred Graham is a 66 y.o. male presenting for rehcek of depression. Tells me he would like to try something to make him feel better.  Tells me that he found his father after suicide.  He was tearful last time he was here.  Has a surrogate adult child that he cares greatly and that feeling is mutual.  Fred Graham me that most days of the week feels that something.  Tells me that he has had thoughts that life is not worth living, but he does not have any plans to carry this out.  He would not do this to his surrogate daughter however and lives for her.  He also enjoys riding motorcycles too.  Hsa had some suicidal thoughts since his father's passing as a young man.  He does own firearms.     Depression screen PHQ 2/9 12/06/2016  Decreased Interest 1  Down, Depressed, Hopeless 1  PHQ - 2 Score 2  Altered sleeping 0  Tired, decreased energy 0  Change in appetite 0  Feeling bad or failure about yourself  1  Trouble concentrating 0  Moving slowly or fidgety/restless 0  Suicidal thoughts 1  PHQ-9 Score 4  Difficult doing work/chores Somewhat difficult     He is allergic to penicillins.   He  has a past medical history of Allergy; Arthritis; and Hypertension.    He  reports that he has quit smoking. He has never used smokeless tobacco. He reports that he drinks about 8.4 oz of alcohol per week . He reports that he does not use drugs. He  reports that he does not engage in sexual activity. The patient  has no past surgical history on file.  His family history includes Cancer in his brother; Depression in his father; Heart disease (age of onset: 89) in his father; Heart disease (age of onset: 93) in his brother.  Review of Systems  Psychiatric/Behavioral: Positive for depression and suicidal ideas. Negative for hallucinations, memory loss and substance abuse. The patient is not nervous/anxious and does not have insomnia.     The  problem list and medications were reviewed and updated by myself where necessary and exist elsewhere in the encounter.   OBJECTIVE:  Pulse (!) 102   Temp 98.2 F (36.8 C) (Oral)   Resp 16   Ht 6' 2.29" (1.887 m)   Wt 200 lb (90.7 kg)   SpO2 98%   BMI 25.48 kg/m   Wt Readings from Last 3 Encounters:  03/19/17 200 lb (90.7 kg)  12/06/16 196 lb 12.8 oz (89.3 kg)  10/25/16 213 lb 12.8 oz (97 kg)     BP Readings from Last 3 Encounters:  12/06/16 (!) 142/87  10/25/16 (!) 142/78  04/10/16 (!) 142/88      Physical Exam  Constitutional: He appears well-developed. He is active and cooperative.  Non-toxic appearance.  Cardiovascular: Normal rate.   Pulmonary/Chest: Effort normal. No tachypnea.  Neurological: He is alert.  Skin: Skin is warm and dry. He is not diaphoretic. No pallor.  Psychiatric: Judgment normal. His mood appears not anxious. His affect is not angry, not blunt, not labile and not inappropriate. His speech is not rapid and/or pressured. He is not agitated and not slowed. Thought content is not paranoid and not delusional. Cognition and memory are normal. He exhibits a depressed mood. He expresses no homicidal and no suicidal ideation. He expresses no suicidal plans  and no homicidal plans.  Vitals reviewed.   No results found for this or any previous visit (from the past 72 hour(s)).  No results found.  ASSESSMENT AND PLAN:  Fred Graham was seen today for advice only.  Diagnoses and all orders for this visit:  Dysthymia: RTC in about 3 months or sooner for a recheck.  -     escitalopram (LEXAPRO) 5 MG tablet; Take 1 tablet (5 mg total) by mouth at bedtime. Take with a snack.  Do not miss doses.    The patient is advised to call or return to clinic if he does not see an improvement in symptoms, or to seek the care of the closest emergency department if he worsens with the above plan.   Deliah Boston, MHS, PA-C [facility] at [facility] 03/19/2017 3:32 PM

## 2017-03-19 NOTE — Patient Instructions (Signed)
     IF you received an x-ray today, you will receive an invoice from [facility]. Please contact [facility] at [phone] with questions or concerns regarding your invoice.   IF you received labwork today, you will receive an invoice from [facility]. Please contact [facility] at [phone] with questions or concerns regarding your invoice.   Our billing staff will not be able to assist you with questions regarding bills from these companies.  You will be contacted with the lab results as soon as they are available. The fastest way to get your results is to activate your My Chart account. Instructions are located on the last page of this paperwork. If you have not heard from us regarding the results in 2 weeks, please contact this office.     

## 2017-05-06 ENCOUNTER — Telehealth: Payer: Self-pay

## 2017-05-06 NOTE — Telephone Encounter (Signed)
Called pt to schedule AWV. Patient also scheduled follow up with PCP and AWV for 05/20/17.   Patient is requesting refill of blood pressure medications sent to [facility] to get him by until his appointment.    Sherle PoeNicole Jaelle Campanile, B.A.  Care Guide - [facility] at [facility] [phone]

## 2017-05-07 ENCOUNTER — Other Ambulatory Visit: Payer: Self-pay

## 2017-05-07 DIAGNOSIS — I10 Essential (primary) hypertension: Secondary | ICD-10-CM

## 2017-05-07 MED ORDER — AMLODIPINE BESYLATE 5 MG PO TABS: 5.0000 mg | ORAL_TABLET | Freq: Every day | ORAL | 0 refills | Status: DC

## 2017-05-07 MED ORDER — LISINOPRIL-HYDROCHLOROTHIAZIDE 20-25 MG PO TABS: 1.0000 | ORAL_TABLET | Freq: Every day | ORAL | 0 refills | Status: DC

## 2017-05-07 NOTE — Telephone Encounter (Signed)
Rx sent in

## 2017-05-20 ENCOUNTER — Ambulatory Visit: Payer: Medicare Other | Admitting: Physician Assistant

## 2017-05-20 ENCOUNTER — Ambulatory Visit: Payer: Medicare Other

## 2017-06-07 ENCOUNTER — Other Ambulatory Visit: Payer: Self-pay

## 2017-06-07 ENCOUNTER — Encounter: Payer: Self-pay | Admitting: Physician Assistant

## 2017-06-07 ENCOUNTER — Ambulatory Visit (INDEPENDENT_AMBULATORY_CARE_PROVIDER_SITE_OTHER): Payer: BC Managed Care – PPO | Admitting: Physician Assistant

## 2017-06-07 VITALS — BP 142/82 | HR 98 | Temp 98.5°F | Resp 16 | Ht 74.29 in | Wt 201.8 lb

## 2017-06-07 DIAGNOSIS — I10 Essential (primary) hypertension: Secondary | ICD-10-CM

## 2017-06-07 DIAGNOSIS — F341 Dysthymic disorder: Secondary | ICD-10-CM | POA: Insufficient documentation

## 2017-06-07 DIAGNOSIS — Z23 Encounter for immunization: Secondary | ICD-10-CM

## 2017-06-07 MED ORDER — AMLODIPINE BESYLATE 5 MG PO TABS: 5.0000 mg | ORAL_TABLET | Freq: Every day | ORAL | 3 refills | Status: DC

## 2017-06-07 MED ORDER — LISINOPRIL-HYDROCHLOROTHIAZIDE 20-25 MG PO TABS: 1.0000 | ORAL_TABLET | Freq: Every day | ORAL | 3 refills | Status: DC

## 2017-06-07 NOTE — Patient Instructions (Addendum)
Take an Aleve over the counter every 12 hours for your cold if you want. Continues your netty pot. Return to see me in 3 months for a follow up discussion.

## 2017-06-07 NOTE — Progress Notes (Signed)
06/07/2017 3:42 PM   DOB: March 11, 1951 / MRN: [id number]  SUBJECTIVE:  Fred Graham is a 66 y.o. male presenting for recheck of dysthymia.  Never filled the Lexapro due to fear of side effects.  See my last note for a detailed history of his dysthymia.  He mostly has questions regarding the risk for worsening depression and priaprism. He tells me his depression is no better since our last meeting, and he tends to be somewhat more down during the holidays.  He has a baseline with regard to suicidal thoughts but tells me that he is not worse today and he has no plans in place to commit suicide.  He lives for his surrogate daughter who brings both hope and joy to his life.  His grandson just joined the [employer] and he is worried about him.   Complains of cough and cold that started about 3 days ago and seems to be getting worse. No fever, chills, rigor. Has tried gargles.     Depression screen [facility] 2/9 06/07/2017 12/06/2016 10/25/2016  Decreased Interest '1 1 1  ' Down, Depressed, Hopeless '1 1 1  ' PHQ - 2 Score '2 2 2  ' Altered sleeping 2 0 1  Tired, decreased energy 2 0 1  Change in appetite 0 0 0  Feeling bad or failure about yourself  '2 1 1  ' Trouble concentrating 0 0 0  Moving slowly or fidgety/restless 0 0 0  Suicidal thoughts '1 1 1  ' PHQ-9 Score '9 4 6  ' Difficult doing work/chores Very difficult Somewhat difficult Somewhat difficult     He is allergic to penicillins.   He  has a past medical history of Allergy, Arthritis, and Hypertension.    He  reports that he has quit smoking. he has never used smokeless tobacco. He reports that he drinks about 8.4 oz of alcohol per week. He reports that he does not use drugs. He  reports that he does not engage in sexual activity. The patient  has no past surgical history on file.  His family history includes Cancer in his brother; Depression in his father; Heart disease (age of onset: 62) in his father; Heart disease (age of onset: 2) in his  brother.  Review of Systems  Psychiatric/Behavioral: Positive for depression. Negative for hallucinations, memory loss and substance abuse. The patient is nervous/anxious. The patient does not have insomnia.     The problem list and medications were reviewed and updated by myself where necessary and exist elsewhere in the encounter.   OBJECTIVE:  BP (!) 142/82 (BP Location: Right Arm, Patient Position: Sitting, Cuff Size: Normal)   Pulse 98   Temp 98.5 F (36.9 C) (Oral)   Resp 16   Ht 6' 2.29" (1.887 m)   Wt 201 lb 12.8 oz (91.5 kg)   SpO2 99%   BMI 25.71 kg/m   BP Readings from Last 3 Encounters:  06/07/17 (!) 142/82  12/06/16 (!) 142/87  10/25/16 (!) 142/78     Physical Exam  Constitutional: He is oriented to person, place, and time. He appears well-developed. He is active and cooperative.  Non-toxic appearance.  Eyes: EOM are normal. Pupils are equal, round, and reactive to light.  Cardiovascular: Normal rate, regular rhythm, S1 normal, S2 normal, normal heart sounds, intact distal pulses and normal pulses. Exam reveals no gallop and no friction rub.  No murmur heard. Pulmonary/Chest: Effort normal. No stridor. No tachypnea. No respiratory distress. He has no wheezes. He has no  rales.  Abdominal: He exhibits no distension.  Musculoskeletal: He exhibits no edema.  Neurological: He is alert and oriented to person, place, and time. He has normal strength and normal reflexes. He is not disoriented. No cranial nerve deficit or sensory deficit. He exhibits normal muscle tone. Coordination and gait normal.  Skin: Skin is warm and dry. He is not diaphoretic. No pallor.  Psychiatric: His behavior is normal.  Vitals reviewed.   No results found for this or any previous visit (from the past 72 hour(s)).  No results found.  ASSESSMENT AND PLAN:  Leonid was seen today for follow-up.  Diagnoses and all orders for this visit:  Essential hypertension: Controlled per [facility].  Will  continue the current plan.  -     CMP14+EGFR -     lisinopril-hydrochlorothiazide (PRINZIDE,ZESTORETIC) 20-25 MG tablet; Take 1 tablet by mouth daily. -     amLODipine (NORVASC) 5 MG tablet; Take 1 tablet (5 mg total) by mouth daily.  Needs flu shot -     Flu Vaccine QUAD 36+ mos IM  Dysthymia: He will try the Lexapro.  He will stop if it makes him feel bad. RTC in about 3 months.  Sooner if needed.     The patient is advised to call or return to clinic if he does not see an improvement in symptoms, or to seek the care of the closest emergency department if he worsens with the above plan.   Philis Fendt, MHS, PA-C [facility] 06/07/2017 3:42 PM

## 2017-06-08 LAB — CMP14+EGFR
ALBUMIN: 4.4 g/dL (ref 3.6–4.8)
ALT: 13 IU/L (ref 0–44)
AST: 21 IU/L (ref 0–40)
Albumin/Globulin Ratio: 1.9 (ref 1.2–2.2)
Alkaline Phosphatase: 49 IU/L (ref 39–117)
BILIRUBIN TOTAL: 0.7 mg/dL (ref 0.0–1.2)
BUN / CREAT RATIO: 7 — AB (ref 10–24)
BUN: 6 mg/dL — ABNORMAL LOW (ref 8–27)
CHLORIDE: 90 mmol/L — AB (ref 96–106)
CO2: 23 mmol/L (ref 20–29)
CREATININE: 0.83 mg/dL (ref 0.76–1.27)
Calcium: 9.8 mg/dL (ref 8.6–10.2)
GFR calc Af Amer: 106 mL/min/{1.73_m2} (ref >59)
GFR calc non Af Amer: 92 mL/min/{1.73_m2} (ref >59)
GLOBULIN, TOTAL: 2.3 g/dL (ref 1.5–4.5)
Glucose: 86 mg/dL (ref 65–99)
Potassium: 4.5 mmol/L (ref 3.5–5.2)
SODIUM: 130 mmol/L — AB (ref 134–144)
Total Protein: 6.7 g/dL (ref 6.0–8.5)

## 2017-09-02 ENCOUNTER — Ambulatory Visit: Payer: Self-pay | Admitting: Physician Assistant

## 2017-09-02 ENCOUNTER — Ambulatory Visit: Payer: Self-pay

## 2017-09-16 ENCOUNTER — Encounter: Payer: Self-pay | Admitting: Physician Assistant

## 2018-01-04 IMAGING — DX DG ABDOMEN 2V
3 series · 3 of 3 positions shown · non-contrast
Comparison: None.

CLINICAL DATA: Bloating and diarrhea

EXAM:
ABDOMEN - 2 VIEW

[abdomen erect]
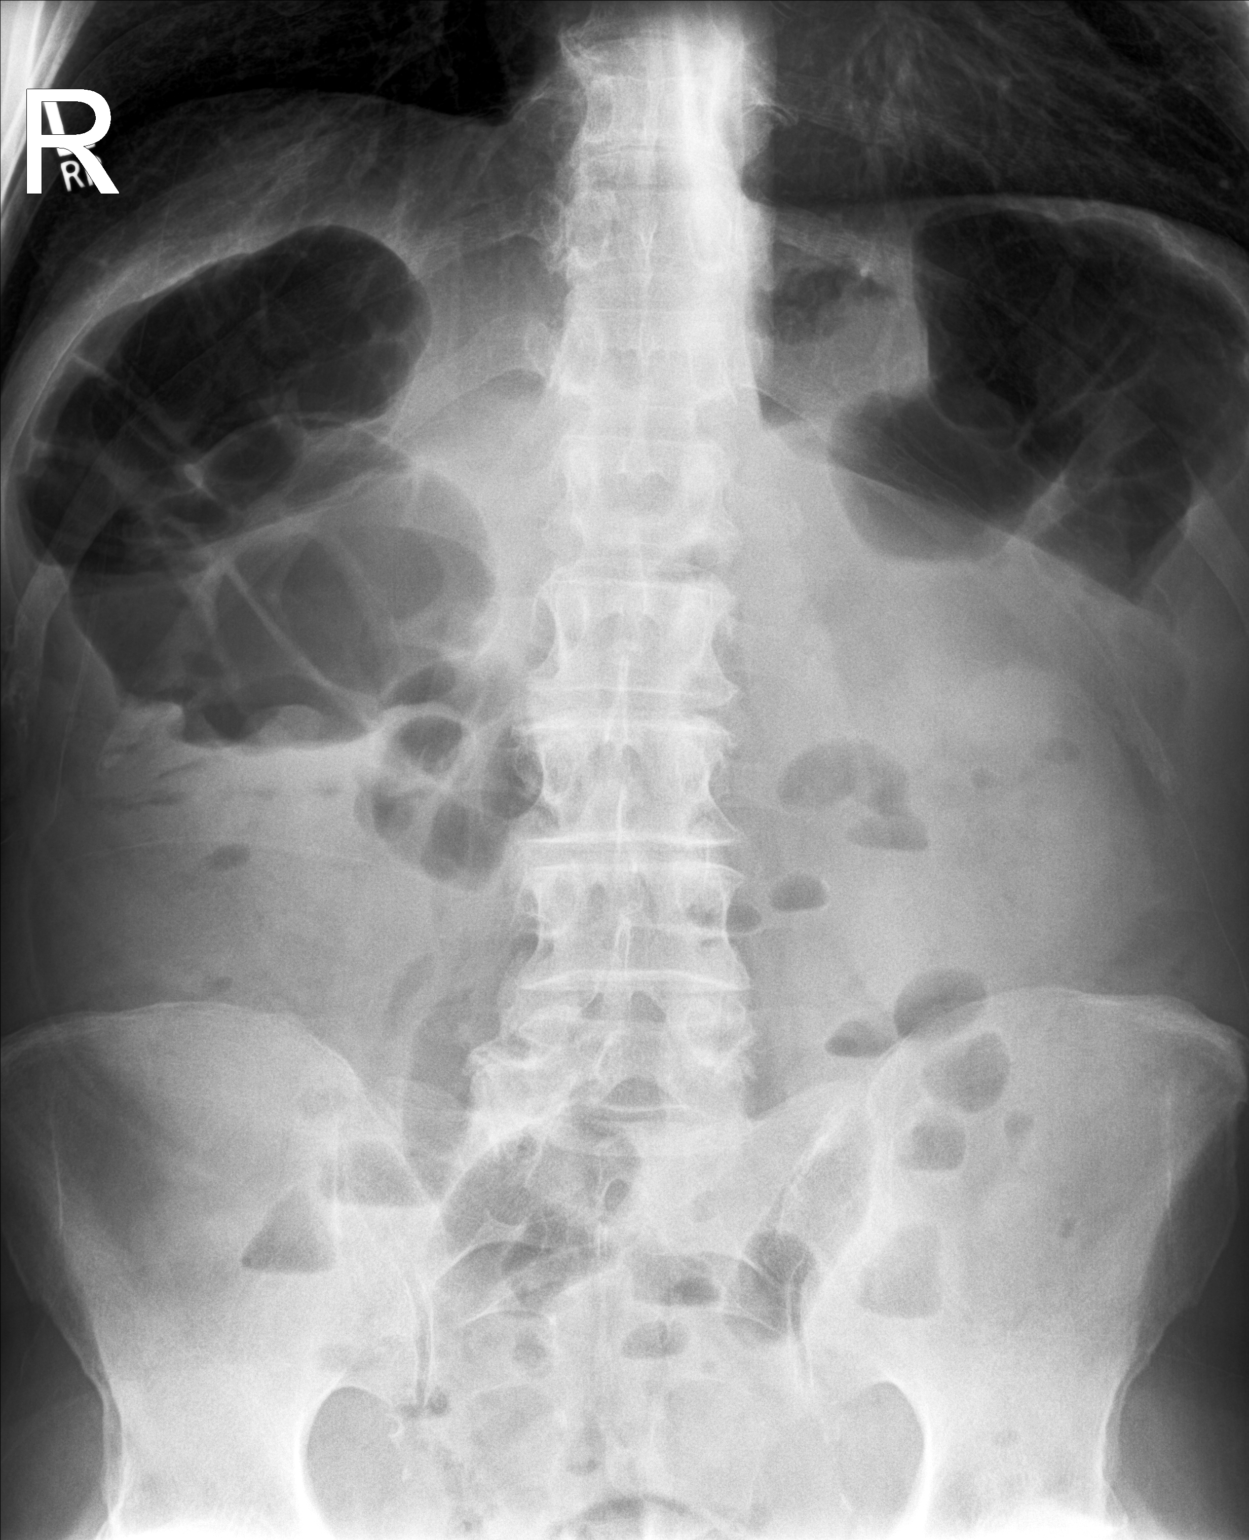

[abdomen supine (1 of 2)]
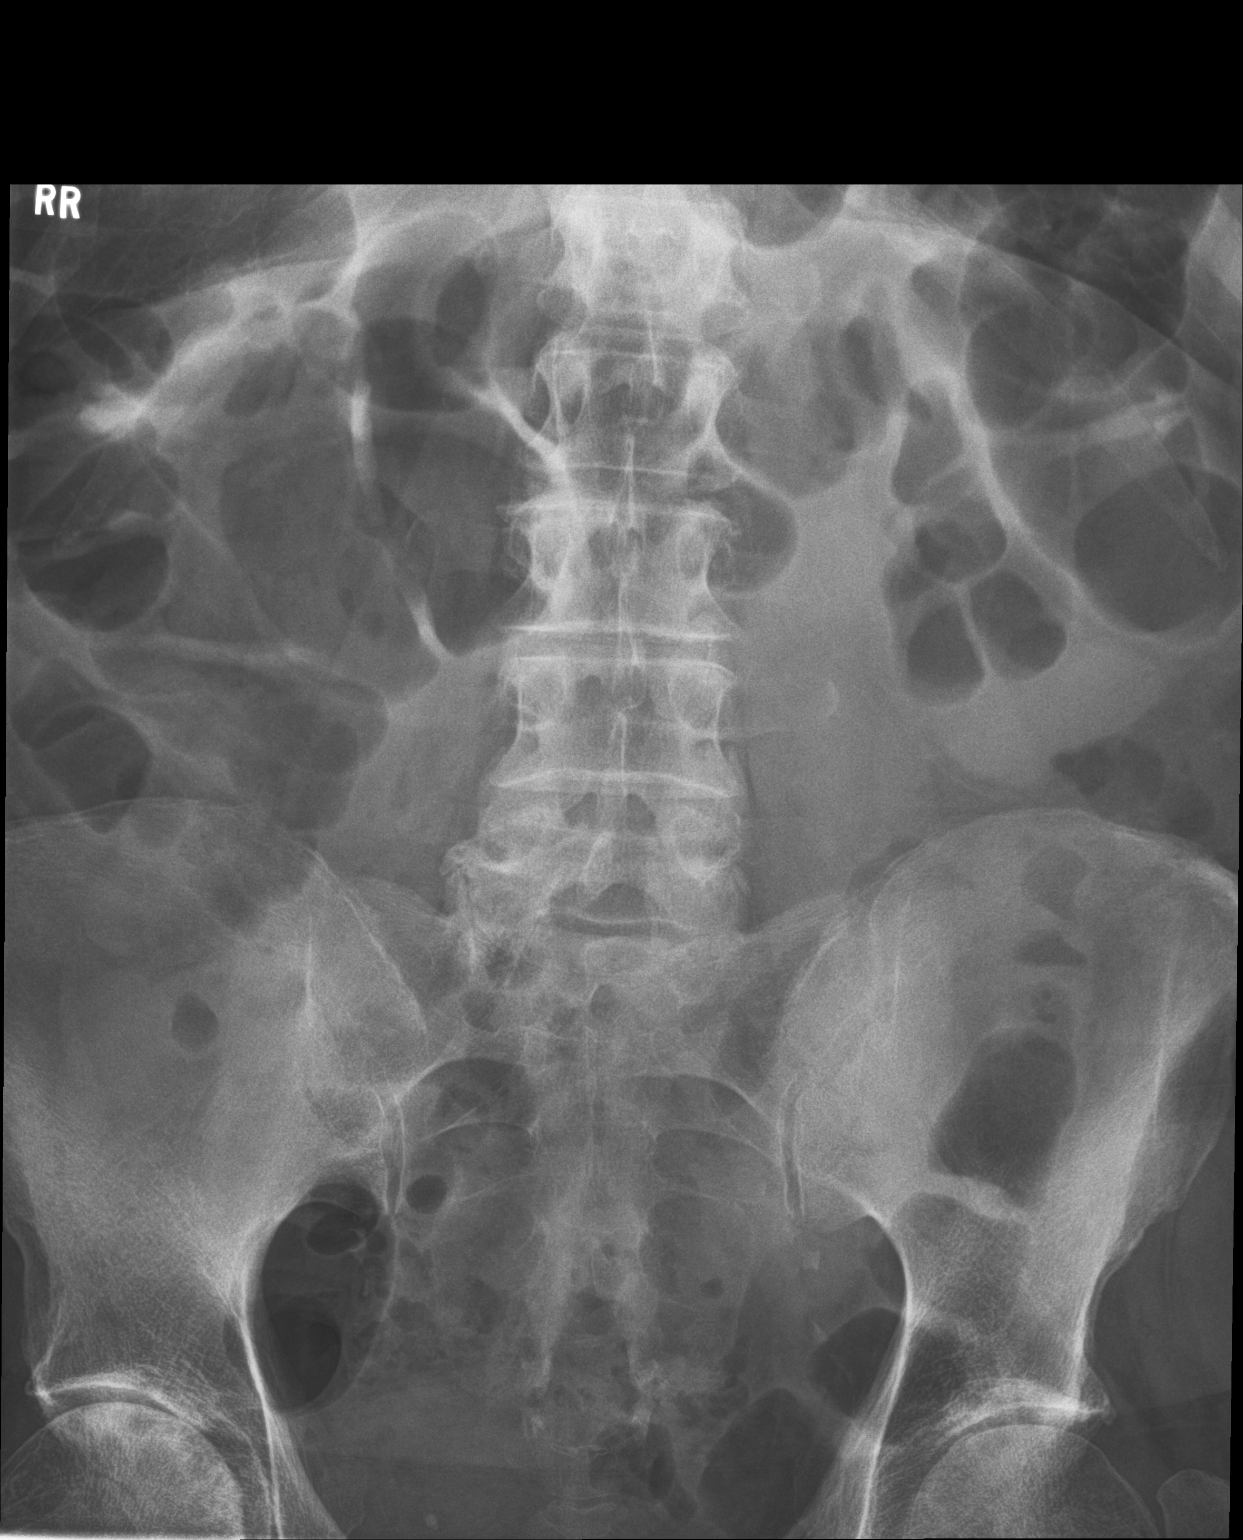

[abdomen supine (2 of 2)]
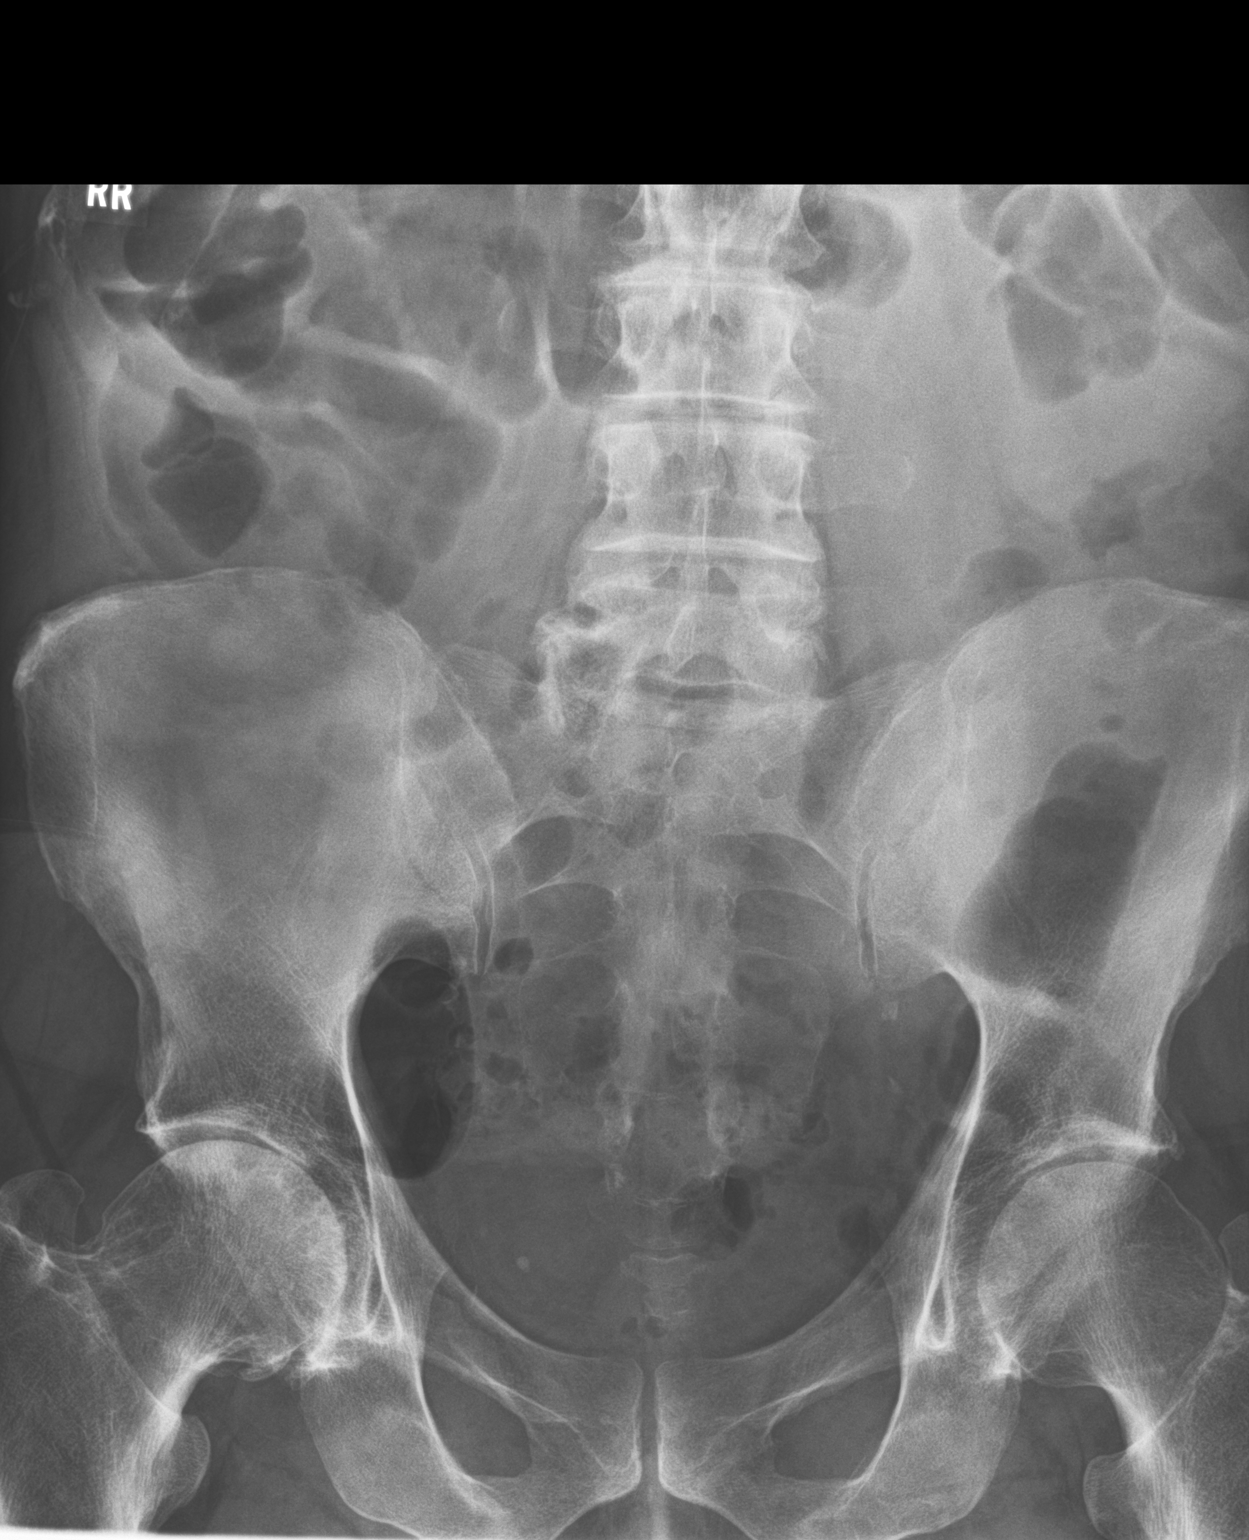

[3 of 3 positions shown; findings below may reference images not displayed]

FINDINGS: The bowel gas pattern is normal. There is no evidence of free air.
No radio-opaque calculi or other significant radiographic
abnormality is seen.
IMPRESSION: Negative.

## 2018-03-20 DIAGNOSIS — Z23 Encounter for immunization: Secondary | ICD-10-CM | POA: Diagnosis not present

## 2018-06-02 ENCOUNTER — Other Ambulatory Visit: Payer: Self-pay | Admitting: Physician Assistant

## 2018-06-02 DIAGNOSIS — I10 Essential (primary) hypertension: Secondary | ICD-10-CM

## 2018-06-04 NOTE — Telephone Encounter (Signed)
Called pt. To schedule an appointment. Unable to leave a message on pt.'s phone.

## 2018-06-09 ENCOUNTER — Other Ambulatory Visit: Payer: Self-pay | Admitting: Family Medicine

## 2018-06-09 DIAGNOSIS — I10 Essential (primary) hypertension: Secondary | ICD-10-CM

## 2018-06-09 NOTE — Telephone Encounter (Signed)
Attempted to call patient to schedule an appointment for his refills for amlodipine 5 mg and lisinopril - hydrochlorothiazide  20-25 mg. No answer, left message to call office and schedule.  LOV  06/07/17 Formerly patient of Deliah BostonMichael Clark. Has not establish care with another provider at the office.

## 2018-06-09 NOTE — Telephone Encounter (Signed)
Copied from CRM [phone]. Topic: Quick Communication - Rx Refill/Question >> Jun 09, 2018 11:00 AM Burchel, Abbi R wrote: Medication: amLODipine (NORVASC) 5 MG tablet, lisinopril-hydrochlorothiazide (PRINZIDE,ZESTORETIC) 20-25 MG tablet  Preferred Pharmacy: [facility] [place] - [address] [place] [address] [address] [address] [place] [id number] Phone: [phone] Fax: [phone]  Pt was advised that RX refills may take up to 3 business days. We ask that you follow-up with your pharmacy.

## 2018-06-10 ENCOUNTER — Encounter: Payer: Self-pay | Admitting: Family Medicine

## 2018-06-10 ENCOUNTER — Ambulatory Visit (INDEPENDENT_AMBULATORY_CARE_PROVIDER_SITE_OTHER): Payer: Medicare Other | Admitting: Family Medicine

## 2018-06-10 ENCOUNTER — Other Ambulatory Visit: Payer: Self-pay

## 2018-06-10 VITALS — BP 138/72 | HR 89 | Temp 98.5°F | Ht 74.0 in | Wt 200.6 lb

## 2018-06-10 DIAGNOSIS — E871 Hypo-osmolality and hyponatremia: Secondary | ICD-10-CM | POA: Diagnosis not present

## 2018-06-10 DIAGNOSIS — Z1159 Encounter for screening for other viral diseases: Secondary | ICD-10-CM | POA: Diagnosis not present

## 2018-06-10 DIAGNOSIS — F341 Dysthymic disorder: Secondary | ICD-10-CM | POA: Diagnosis not present

## 2018-06-10 DIAGNOSIS — I10 Essential (primary) hypertension: Secondary | ICD-10-CM | POA: Diagnosis not present

## 2018-06-10 MED ORDER — LISINOPRIL-HYDROCHLOROTHIAZIDE 20-25 MG PO TABS: 1.0000 | ORAL_TABLET | Freq: Every day | ORAL | 3 refills | Status: DC

## 2018-06-10 MED ORDER — ESCITALOPRAM OXALATE 5 MG PO TABS: 5.0000 mg | ORAL_TABLET | Freq: Every day | ORAL | 1 refills | Status: DC

## 2018-06-10 MED ORDER — AMLODIPINE BESYLATE 5 MG PO TABS: 5.0000 mg | ORAL_TABLET | Freq: Every day | ORAL | 3 refills | Status: DC

## 2018-06-10 NOTE — Patient Instructions (Signed)
° ° ° °  If you have lab work done today you will be contacted with your lab results within the next 2 weeks.  If you have not heard from us then please contact us. The fastest way to get your results is to register for My Chart. ° ° °IF you received an x-ray today, you will receive an invoice from [facility]. Please contact [facility] at [phone] with questions or concerns regarding your invoice.  ° °IF you received labwork today, you will receive an invoice from [facility]. Please contact [facility] at [phone] with questions or concerns regarding your invoice.  ° °Our billing staff will not be able to assist you with questions regarding bills from these companies. ° °You will be contacted with the lab results as soon as they are available. The fastest way to get your results is to activate your My Chart account. Instructions are located on the last page of this paperwork. If you have not heard from us regarding the results in 2 weeks, please contact this office. °  ° ° ° °

## 2018-06-10 NOTE — Progress Notes (Signed)
Established Patient Office Visit  Subjective:  Patient ID: Fred Graham, male    DOB: 1951-06-18  Age: 67 y.o. MRN: [id number]  CC:  Chief Complaint  Patient presents with  . Medication Refill    BP   . Depression    PHQ9=4    HPI Fred Picketarl K Baldyga presents for depression  He denies suicidal thoughts He exercises as much as he can His mood has improved and he feels more like himself  He is down to only a few pills left The holidays are a hard time for him A lot of his close friends have died and he feels like the last man standing. He states that he has retired and his daughter is not far away but his she has 4 children and stay busy.  [facility] 06/10/2018 06/07/2017 12/06/2016 10/25/2016 10/25/2016  Decreased Interest 1 1 1 1  0  Down, Depressed, Hopeless 1 1 1 1  0  PHQ - 2 Score 2 2 2 2  0  Altered sleeping 1 2 0 1 -  Tired, decreased energy 0 2 0 1 -  Change in appetite 0 0 0 0 -  Feeling bad or failure about yourself  1 2 1 1  -  Trouble concentrating 0 0 0 0 -  Moving slowly or fidgety/restless 0 0 0 0 -  Suicidal thoughts 0 1 1 1  -  PHQ-9 Score 4 9 4 6  -  Difficult doing work/chores Not difficult at all Very difficult Somewhat difficult Somewhat difficult -     Past Medical History:  Diagnosis Date  . Allergy   . Arthritis    hands, hips  . Hypertension     No past surgical history on file.  Family History  Problem Relation Age of Onset  . Cancer Brother        skin cancer  . Heart disease Brother [id number]       cardiac stenting  . Depression Father   . Heart disease Father [id number]       CABG    Social History   Socioeconomic History  . Marital status: Divorced    Spouse name: Not on file  . Number of children: Not on file  . Years of education: Not on file  . Highest education level: Not on file  Occupational History  . Not on file  Social Needs  . Financial resource strain: Not on file  . Food insecurity:    Worry: Not on file   Inability: Not on file  . Transportation needs:    Medical: Not on file    Non-medical: Not on file  Tobacco Use  . Smoking status: Former Games developermoker  . Smokeless tobacco: Never Used  Substance and Sexual Activity  . Alcohol use: Yes    Alcohol/week: 14.0 standard drinks    Types: 14 Cans of beer per week  . Drug use: No  . Sexual activity: Never    Birth control/protection: None  Lifestyle  . Physical activity:    Days per week: Not on file    Minutes per session: Not on file  . Stress: Not on file  Relationships  . Social connections:    Talks on phone: Not on file    Gets together: Not on file    Attends religious service: Not on file    Active member of club or organization: Not on file    Attends meetings of clubs or organizations: Not on file    Relationship  status: Not on file  . Intimate partner violence:    Fear of current or ex partner: Not on file    Emotionally abused: Not on file    Physically abused: Not on file    Forced sexual activity: Not on file  Other Topics Concern  . Not on file  Social History Narrative   Marital status: divorced.      Children: 1 daughter; 4 grandchildren.      Employment: retired from [employer] in 2010; odd jobs.      Tobacco: former smoker.       Alcohol:  4 beers per day.      Exercise: walking.    Outpatient Medications Prior to Visit  Medication Sig Dispense Refill  . amLODipine (NORVASC) 5 MG tablet Take 1 tablet (5 mg total) by mouth daily. 90 tablet 3  . lisinopril-hydrochlorothiazide (PRINZIDE,ZESTORETIC) 20-25 MG tablet Take 1 tablet by mouth daily. 90 tablet 3  . loperamide (IMODIUM A-D) 2 MG tablet Take 1 tablet (2 mg total) by mouth 4 (four) times daily as needed for diarrhea or loose stools. 30 tablet 0  . psyllium (METAMUCIL SMOOTH TEXTURE) 28 % packet Take 1 packet by mouth daily. Start with 1/2 pack daily for three days then move to the full pack daily. 30 packet 11  . escitalopram  (LEXAPRO) 5 MG tablet Take 1 tablet (5 mg total) by mouth at bedtime. Take with a snack.  Do not miss doses. (Patient not taking: Reported on 06/07/2017) 90 tablet 1  . fluticasone (FLONASE) 50 MCG/ACT nasal spray Place 2 sprays into both nostrils daily. (Patient not taking: Reported on 06/07/2017) 16 g 6  . lidocaine (XYLOCAINE) 2 % solution Use as directed 5 mLs in the mouth or throat as needed. Gargle and spit. (Patient not taking: Reported on 06/07/2017) 100 mL 0   No facility-administered medications prior to visit.     Allergies  Allergen Reactions  . Penicillins     Rash     ROS Review of Systems Review of Systems  Constitutional: Negative for activity change, appetite change, chills and fever.  HENT: Negative for congestion, nosebleeds, trouble swallowing and voice change.   Respiratory: Negative for cough, shortness of breath and wheezing.   Gastrointestinal: Negative for diarrhea, nausea and vomiting.  Genitourinary: Negative for difficulty urinating, dysuria, flank pain and hematuria.  Musculoskeletal: Negative for back pain, joint swelling and neck pain.  Neurological: Negative for dizziness, speech difficulty, light-headedness and numbness.  See HPI. All other review of systems negative.     Objective:    Physical Exam  BP 138/72   Pulse 89   Temp 98.5 F (36.9 C) (Oral)   Ht 6\' 2"  (1.88 m)   Wt 200 lb 9.6 oz (91 kg)   SpO2 98%   BMI 25.76 kg/m  Wt Readings from Last 3 Encounters:  06/10/18 200 lb 9.6 oz (91 kg)  06/07/17 201 lb 12.8 oz (91.5 kg)  03/19/17 200 lb (90.7 kg)   Physical Exam  Constitutional: Oriented to person, place, and time. Appears well-developed and well-nourished.  HENT:  Head: Normocephalic and atraumatic.  Eyes: Conjunctivae and EOM are normal.  Cardiovascular: Normal rate, regular rhythm, normal heart sounds and intact distal pulses.  No murmur heard. Pulmonary/Chest: Effort normal and breath sounds normal. No stridor. No  respiratory distress. Has no wheezes.  Neurological: Is alert and oriented to person, place, and time.  Skin: Skin is warm. Capillary refill takes less than  2 seconds.  Psychiatric: Has a normal mood and affect. Behavior is normal. Judgment and thought content normal.    Health Maintenance Due  Topic Date Due  . Hepatitis C Screening  20-Jan-1951  . TETANUS/TDAP  09/09/1969  . PNA vac Low Risk Adult (1 of 2 - PCV13) 09/10/2015  . INFLUENZA VACCINE  01/16/2018    There are no preventive care reminders to display for this patient.  Lab Results  Component Value Date   TSH 1.480 10/25/2016   Lab Results  Component Value Date   WBC 5.0 12/06/2016   HGB 14.6 12/06/2016   HCT 41.3 (A) 12/06/2016   MCV 90.9 12/06/2016   PLT 245 01/30/2015   Lab Results  Component Value Date   NA 130 (L) 06/07/2017   K 4.5 06/07/2017   CO2 23 06/07/2017   GLUCOSE 86 06/07/2017   BUN 6 (L) 06/07/2017   CREATININE 0.83 06/07/2017   BILITOT 0.7 06/07/2017   ALKPHOS 49 06/07/2017   AST 21 06/07/2017   ALT 13 06/07/2017   PROT 6.7 06/07/2017   ALBUMIN 4.4 06/07/2017   CALCIUM 9.8 06/07/2017   Lab Results  Component Value Date   CHOL 203 (H) 01/30/2015   Lab Results  Component Value Date   HDL 85 01/30/2015   Lab Results  Component Value Date   LDLCALC 105 01/30/2015   Lab Results  Component Value Date   TRIG 63 01/30/2015   Lab Results  Component Value Date   CHOLHDL 2.4 01/30/2015   No results found for: HGBA1C    Assessment & Plan:   Problem List Items Addressed This Visit    None      No orders of the defined types were placed in this encounter.   Follow-up: No follow-ups on file.    Doristine Bosworth, MD

## 2018-06-11 LAB — LIPID PANEL
CHOL/HDL RATIO: 2.1 ratio (ref 0.0–5.0)
Cholesterol, Total: 224 mg/dL — ABNORMAL HIGH (ref 100–199)
HDL: 108 mg/dL (ref >39)
LDL Calculated: 103 mg/dL — ABNORMAL HIGH (ref 0–99)
TRIGLYCERIDES: 65 mg/dL (ref 0–149)
VLDL CHOLESTEROL CAL: 13 mg/dL (ref 5–40)

## 2018-06-11 LAB — CMP14+EGFR
A/G RATIO: 2 (ref 1.2–2.2)
ALBUMIN: 4.8 g/dL (ref 3.6–4.8)
ALT: 20 IU/L (ref 0–44)
AST: 21 IU/L (ref 0–40)
Alkaline Phosphatase: 49 IU/L (ref 39–117)
BUN / CREAT RATIO: 8 — AB (ref 10–24)
BUN: 7 mg/dL — ABNORMAL LOW (ref 8–27)
Bilirubin Total: 0.7 mg/dL (ref 0.0–1.2)
CO2: 24 mmol/L (ref 20–29)
Calcium: 10.3 mg/dL — ABNORMAL HIGH (ref 8.6–10.2)
Chloride: 89 mmol/L — ABNORMAL LOW (ref 96–106)
Creatinine, Ser: 0.93 mg/dL (ref 0.76–1.27)
GFR calc non Af Amer: 85 mL/min/{1.73_m2} (ref >59)
GFR, EST AFRICAN AMERICAN: 98 mL/min/{1.73_m2} (ref >59)
Globulin, Total: 2.4 g/dL (ref 1.5–4.5)
Glucose: 98 mg/dL (ref 65–99)
POTASSIUM: 4.5 mmol/L (ref 3.5–5.2)
Sodium: 129 mmol/L — ABNORMAL LOW (ref 134–144)
TOTAL PROTEIN: 7.2 g/dL (ref 6.0–8.5)

## 2018-06-11 LAB — HCV INTERPRETATION

## 2018-06-11 LAB — HCV AB W/RFLX TO VERIFICATION: HCV Ab: <0.1 s/co ratio (ref 0.0–0.9)

## 2018-10-10 ENCOUNTER — Other Ambulatory Visit: Payer: Self-pay

## 2018-10-10 ENCOUNTER — Telehealth: Payer: Self-pay | Admitting: *Deleted

## 2018-10-10 ENCOUNTER — Telehealth: Payer: Self-pay | Admitting: Family Medicine

## 2018-10-10 ENCOUNTER — Encounter: Payer: BC Managed Care – PPO | Admitting: Family Medicine

## 2018-10-10 NOTE — Telephone Encounter (Signed)
Pt no showed for his Medicare AWV and wanted to reschedule

## 2018-10-10 NOTE — Telephone Encounter (Signed)
Called patient to triage for his appointment, no answer left message in voice mail to call back.

## 2018-10-10 NOTE — Telephone Encounter (Signed)
Patient was called again to triage for his appointment, which was at 8:00 am. The call was sent to voice mail for the second time.  Message left. Unable to reach patient.

## 2018-10-10 NOTE — Telephone Encounter (Signed)
I'd be happy to see him at any of my openings next week - I'm in the office M, Tu, W, F. Whatever time works for him that I have an opening would be fine.

## 2018-10-15 NOTE — Telephone Encounter (Signed)
Scheduled for 10/20/2018. Let pt know it would be virtual

## 2018-10-20 ENCOUNTER — Encounter: Payer: Self-pay | Admitting: Registered Nurse

## 2018-10-20 ENCOUNTER — Ambulatory Visit (INDEPENDENT_AMBULATORY_CARE_PROVIDER_SITE_OTHER): Payer: Medicare Other | Admitting: Registered Nurse

## 2018-10-20 VITALS — BP 120/70 | Ht 75.0 in | Wt 210.0 lb

## 2018-10-20 DIAGNOSIS — Z0001 Encounter for general adult medical examination with abnormal findings: Secondary | ICD-10-CM | POA: Diagnosis not present

## 2018-10-20 DIAGNOSIS — M1611 Unilateral primary osteoarthritis, right hip: Secondary | ICD-10-CM | POA: Diagnosis not present

## 2018-10-20 DIAGNOSIS — Z0000 Encounter for general adult medical examination without abnormal findings: Secondary | ICD-10-CM

## 2018-10-20 NOTE — Patient Instructions (Signed)
  Mr. Fred Graham , Thank you for taking time to come for your Medicare Wellness Visit. I appreciate your ongoing commitment to your health goals. Please review the following plan we discussed and let me know if I can assist you in the future.   These are the goals we discussed: Goals    . Exercise 150 min/wk Moderate Activity     Pt has significant OA of r hip.        This is a list of the screening recommended for you and due dates:  Health Maintenance  Topic Date Due  . Tetanus Vaccine  09/09/1969  . Pneumonia vaccines (1 of 2 - PCV13) 09/10/2015  . Flu Shot  01/17/2019  . Cologuard (Stool DNA test)  03/15/2020  .  Hepatitis C: One time screening is recommended by Center for Disease Control  (CDC) for  adults born from [birth year] through 1965.   Completed

## 2018-10-20 NOTE — Progress Notes (Signed)
Fred Graham is a pleasant 68 y.o. male presenting today for Medicare Annual Wellness Visit. Chase Picket understands that this visit is taking place over telehealth and it takes place of an in-person visit.   Date of last exam: 10/20/18  Interpreter used for this visit? no  Patient Care Team: Patient, No Pcp Per as PCP - General (General Practice) Pt to establish care at future visit  Other items to address today: OA in right hip. Pt last imaging was 2015. Will reorder today, to place ortho surg consult based on results.   Other Screening: Last screening for diabetes:  No results found for: HGBA1C  Last lipid screening: Lab Results  Component Value Date   CHOL 224 (H) 06/10/2018   HDL 108 06/10/2018   LDLCALC 103 (H) 06/10/2018   LDLDIRECT 135 (H) 02/22/2013   TRIG 65 06/10/2018   CHOLHDL 2.1 06/10/2018     ADVANCE DIRECTIVES: Discussed: yes On File: no Materials Provided: Yes via mail  Immunization status:  Immunization History  Administered Date(s) Administered  . Influenza,inj,Quad PF,6+ Mos 06/07/2017     Health Maintenance Due  Topic Date Due  . TETANUS/TDAP  09/09/1969  . PNA vac Low Risk Adult (1 of 2 - PCV13) 09/10/2015     Functional Status Survey: Is the patient deaf or have difficulty hearing?: No Does the patient have difficulty seeing, even when wearing glasses/contacts?: No Does the patient have difficulty concentrating, remembering, or making decisions?: No Does the patient have difficulty walking or climbing stairs?: No Does the patient have difficulty dressing or bathing?: No Does the patient have difficulty doing errands alone such as visiting a doctor's office or shopping?: No   6CIT Screen 10/20/2018  What Year? 0 points  What month? 0 points  What time? 0 points  Count back from 20 0 points  Months in reverse 0 points  Repeat phrase 0 points  Total Score 0         Home Environment: Lives alone. No throw rugs. No grab  bars. Feels generally safe. 1 fall in history, d/t leg "falling asleep". Pt not concerned for balance. Consumes alcohol, but does not "get drunk".    Patient Active Problem List   Diagnosis Date Noted  . Dysthymia 06/07/2017  . Essential hypertension, benign 02/22/2013     Past Medical History:  Diagnosis Date  . Allergy   . Arthritis    hands, hips  . Hypertension      History reviewed. No pertinent surgical history.   Family History  Problem Relation Age of Onset  . Cancer Brother        skin cancer  . Heart disease Brother 25       cardiac stenting  . Depression Father   . Heart disease Father 46       CABG     Social History   Socioeconomic History  . Marital status: Divorced    Spouse name: Not on file  . Number of children: 1  . Years of education: Not on file  . Highest education level: Not on file  Occupational History  . Not on file  Social Needs  . Financial resource strain: Not hard at all  . Food insecurity:    Worry: Never true    Inability: Never true  . Transportation needs:    Medical: No    Non-medical: No  Tobacco Use  . Smoking status: Former Smoker    Packs/day: 1.00  Years: 5.00    Pack years: 5.00    Types: Cigarettes    Last attempt to quit: 10/19/1993    Years since quitting: 25.0  . Smokeless tobacco: Never Used  Substance and Sexual Activity  . Alcohol use: Yes    Alcohol/week: 10.0 standard drinks    Types: 10 Cans of beer per week  . Drug use: No  . Sexual activity: Never    Birth control/protection: None  Lifestyle  . Physical activity:    Days per week: 0 days    Minutes per session: 0 min  . Stress: Not at all  Relationships  . Social connections:    Talks on phone: Twice a week    Gets together: Twice a week    Attends religious service: Never    Active member of club or organization: No    Attends meetings of clubs or organizations: Never    Relationship status: Divorced  . Intimate partner violence:     Fear of current or ex partner: No    Emotionally abused: No    Physically abused: No    Forced sexual activity: No  Other Topics Concern  . Not on file  Social History Narrative   Marital status: divorced.      Children: 1 daughter; 4 grandchildren.      Employment: retired from [employer] in 2010; odd jobs.      Tobacco: former smoker.       Alcohol:  4 beers per day.      Exercise: walking.     Allergies  Allergen Reactions  . Penicillins     Rash      Prior to Admission medications   Medication Sig Start Date End Date Taking? Authorizing Provider  amLODipine (NORVASC) 5 MG tablet Take 1 tablet (5 mg total) by mouth daily. 06/10/18   Doristine Bosworth, MD  escitalopram (LEXAPRO) 5 MG tablet Take 1 tablet (5 mg total) by mouth at bedtime. Take with a snack.  Do not miss doses. 06/10/18   Doristine Bosworth, MD  lisinopril-hydrochlorothiazide (PRINZIDE,ZESTORETIC) 20-25 MG tablet Take 1 tablet by mouth daily. 06/10/18   Doristine Bosworth, MD  loperamide (IMODIUM A-D) 2 MG tablet Take 1 tablet (2 mg total) by mouth 4 (four) times daily as needed for diarrhea or loose stools. 10/25/16   Ofilia Neas, PA-C  psyllium (METAMUCIL SMOOTH TEXTURE) 28 % packet Take 1 packet by mouth daily. Start with 1/2 pack daily for three days then move to the full pack daily. 12/06/16   Ofilia Neas, PA-C     Depression screen [facility] 2/9 10/20/2018 06/10/2018 06/07/2017 12/06/2016 10/25/2016  Decreased Interest 0 1 1 1 1   Down, Depressed, Hopeless 0 1 1 1 1   PHQ - 2 Score 0 2 2 2 2   Altered sleeping - 1 2 0 1  Tired, decreased energy - 0 2 0 1  Change in appetite - 0 0 0 0  Feeling bad or failure about yourself  - 1 2 1 1   Trouble concentrating - 0 0 0 0  Moving slowly or fidgety/restless - 0 0 0 0  Suicidal thoughts - 0 1 1 1   PHQ-9 Score - 4 9 4 6   Difficult doing work/chores - Not difficult at all Very difficult Somewhat difficult Somewhat difficult     Fall Risk   10/20/2018 06/10/2018 06/07/2017 03/19/2017 12/06/2016  Falls in the past year? 1 0 No No No  Number falls  in past yr: 0 - - - -  Injury with Fall? 0 - - - -  Risk for fall due to : History of fall(s) - - - -      PHYSICAL EXAM: BP 120/70   Ht 6\' 3"  (1.905 m)   Wt 210 lb (95.3 kg)   BMI 26.25 kg/m  These represent patient reported vital signs taken at home. As this was a visit taking place via video chat, vital signs were not taken in office.   Wt Readings from Last 3 Encounters:  06/10/18 200 lb 9.6 oz (91 kg)  06/07/17 201 lb 12.8 oz (91.5 kg)  03/19/17 200 lb (90.7 kg)     No exam data present    Physical Exam As this visit was conducted by video chat, a physical exam did not take place.  Education/Counseling provided regarding diet and exercise, prevention of chronic diseases, smoking/tobacco cessation, if applicable, and reviewed "Covered Medicare Preventive Services."   ASSESSMENT/PLAN: 1. Medicare annual wellness visit, subsequent Pt appears to be in generally good health. No major concerns exist at this time. HTN is well controlled with current regimen.   2. Primary osteoarthritis of right hip Ordered R hip imaging. Pt understands that as this is not an urgent need, he may be scheduled for weeks or months in advance. He is considering a R hip arthroplasty d/t his increasingly limited mobility and increasingly frequent pain.   Orders Placed This Encounter  Procedures  . DG Hip Unilat W OR W/O Pelvis 1V Right    Standing Status:   Future    Standing Expiration Date:   04/22/2019    Order Specific Question:   Reason for Exam (SYMPTOM  OR DIAGNOSIS REQUIRED)    Answer:   hx of R hip OA, last imaging 5 years ago. Pt interested in exploring tx options    Order Specific Question:   [facility]    [facility]    Order Specific Question:   Radiology Contrast Protocol - do NOT remove file path    Answer:    [email or web address]     Thank you for your visit with [facility] today.  Janeece Ageeichard Thena Devora, NP [facility] [address] [place], [place] [id number] [phone]

## 2018-11-12 NOTE — Progress Notes (Signed)
This encounter was created in error - please disregard.

## 2019-05-01 ENCOUNTER — Ambulatory Visit: Payer: BC Managed Care – PPO | Admitting: Registered Nurse

## 2019-05-05 ENCOUNTER — Other Ambulatory Visit: Payer: Self-pay

## 2019-05-05 ENCOUNTER — Ambulatory Visit (INDEPENDENT_AMBULATORY_CARE_PROVIDER_SITE_OTHER): Payer: Medicare Other | Admitting: Registered Nurse

## 2019-05-05 ENCOUNTER — Encounter: Payer: Self-pay | Admitting: Registered Nurse

## 2019-05-05 VITALS — BP 154/84 | HR 83 | Temp 97.9°F | Resp 16 | Ht 74.61 in | Wt 205.0 lb

## 2019-05-05 DIAGNOSIS — I10 Essential (primary) hypertension: Secondary | ICD-10-CM | POA: Diagnosis not present

## 2019-05-05 DIAGNOSIS — E871 Hypo-osmolality and hyponatremia: Secondary | ICD-10-CM | POA: Diagnosis not present

## 2019-05-05 DIAGNOSIS — Z13220 Encounter for screening for lipoid disorders: Secondary | ICD-10-CM

## 2019-05-05 MED ORDER — LISINOPRIL-HYDROCHLOROTHIAZIDE 20-25 MG PO TABS: 1.0000 | ORAL_TABLET | Freq: Every day | ORAL | 3 refills | Status: DC

## 2019-05-05 MED ORDER — AMLODIPINE BESYLATE 5 MG PO TABS: 5.0000 mg | ORAL_TABLET | Freq: Every day | ORAL | 3 refills | Status: DC

## 2019-05-06 LAB — COMPREHENSIVE METABOLIC PANEL
ALT: 16 IU/L (ref 0–44)
AST: 22 IU/L (ref 0–40)
Albumin/Globulin Ratio: 2.4 — ABNORMAL HIGH (ref 1.2–2.2)
Albumin: 4.8 g/dL (ref 3.8–4.8)
Alkaline Phosphatase: 47 IU/L (ref 39–117)
BUN/Creatinine Ratio: 11 (ref 10–24)
BUN: 9 mg/dL (ref 8–27)
Bilirubin Total: 0.6 mg/dL (ref 0.0–1.2)
CO2: 23 mmol/L (ref 20–29)
Calcium: 9.4 mg/dL (ref 8.6–10.2)
Chloride: 84 mmol/L — ABNORMAL LOW (ref 96–106)
Creatinine, Ser: 0.8 mg/dL (ref 0.76–1.27)
GFR calc Af Amer: 106 mL/min/{1.73_m2} (ref >59)
GFR calc non Af Amer: 92 mL/min/{1.73_m2} (ref >59)
Globulin, Total: 2 g/dL (ref 1.5–4.5)
Glucose: 77 mg/dL (ref 65–99)
Potassium: 3.8 mmol/L (ref 3.5–5.2)
Sodium: 124 mmol/L — ABNORMAL LOW (ref 134–144)
Total Protein: 6.8 g/dL (ref 6.0–8.5)

## 2019-05-06 LAB — LIPID PANEL
Chol/HDL Ratio: 2 ratio (ref 0.0–5.0)
Cholesterol, Total: 202 mg/dL — ABNORMAL HIGH (ref 100–199)
HDL: 103 mg/dL (ref >39)
LDL Chol Calc (NIH): 86 mg/dL (ref 0–99)
Triglycerides: 75 mg/dL (ref 0–149)
VLDL Cholesterol Cal: 13 mg/dL (ref 5–40)

## 2019-05-06 LAB — CBC
Hematocrit: 37.8 % (ref 37.5–51.0)
Hemoglobin: 13.7 g/dL (ref 13.0–17.7)
MCH: 32.6 pg (ref 26.6–33.0)
MCHC: 36.2 g/dL — ABNORMAL HIGH (ref 31.5–35.7)
MCV: 90 fL (ref 79–97)
Platelets: 245 10*3/uL (ref 150–450)
RBC: 4.2 x10E6/uL (ref 4.14–5.80)
RDW: 12.2 % (ref 11.6–15.4)
WBC: 5.1 10*3/uL (ref 3.4–10.8)

## 2019-05-10 NOTE — Progress Notes (Signed)
Established Patient Office Visit  Subjective:  Patient ID: Fred Graham, male    DOB: [birth year]  Age: 68 y.o. MRN: [id number]  CC:  Chief Complaint  Patient presents with  . Medication Management    6 month follow-up     HPI Fred Graham presents for med check, refills, and labs.   Reports no changes. Feels well overall. No abnormalities. Has been isolated for much of COVID - does not want to get sick.   Past Medical History:  Diagnosis Date  . Allergy   . Arthritis    hands, hips  . Hypertension     History reviewed. No pertinent surgical history.  Family History  Problem Relation Age of Onset  . Cancer Brother        skin cancer  . Heart disease Brother 57       cardiac stenting  . Depression Father   . Heart disease Father 20       CABG    Social History   Socioeconomic History  . Marital status: Divorced    Spouse name: Not on file  . Number of children: 1  . Years of education: Not on file  . Highest education level: Not on file  Occupational History  . Not on file  Social Needs  . Financial resource strain: Not hard at all  . Food insecurity    Worry: Never true    Inability: Never true  . Transportation needs    Medical: No    Non-medical: No  Tobacco Use  . Smoking status: Former Smoker    Packs/day: 1.00    Years: 5.00    Pack years: 5.00    Types: Cigarettes    Quit date: 10/19/1993    Years since quitting: 25.5  . Smokeless tobacco: Never Used  Substance and Sexual Activity  . Alcohol use: Yes    Alcohol/week: 10.0 standard drinks    Types: 10 Cans of beer per week  . Drug use: No  . Sexual activity: Never    Birth control/protection: None  Lifestyle  . Physical activity    Days per week: 0 days    Minutes per session: 0 min  . Stress: Not at all  Relationships  . Social Musician on phone: Twice a week    Gets together: Twice a week    Attends religious service: Never    Active member of club or organization: No     Attends meetings of clubs or organizations: Never    Relationship status: Divorced  . Intimate partner violence    Fear of current or ex partner: No    Emotionally abused: No    Physically abused: No    Forced sexual activity: No  Other Topics Concern  . Not on file  Social History Narrative   Marital status: divorced.      Children: 1 daughter; 4 grandchildren.      Employment: retired from [employer] in 2010; odd jobs.      Tobacco: former smoker.       Alcohol:  4 beers per day.      Exercise: walking.    Outpatient Medications Prior to Visit  Medication Sig Dispense Refill  . escitalopram (LEXAPRO) 5 MG tablet Take 1 tablet (5 mg total) by mouth at bedtime. Take with a snack.  Do not miss doses. 90 tablet 1  . loperamide (IMODIUM A-D) 2 MG tablet Take 1 tablet (2  mg total) by mouth 4 (four) times daily as needed for diarrhea or loose stools. 30 tablet 0  . psyllium (METAMUCIL SMOOTH TEXTURE) 28 % packet Take 1 packet by mouth daily. Start with 1/2 pack daily for three days then move to the full pack daily. 30 packet 11  . amLODipine (NORVASC) 5 MG tablet Take 1 tablet (5 mg total) by mouth daily. 90 tablet 3  . lisinopril-hydrochlorothiazide (PRINZIDE,ZESTORETIC) 20-25 MG tablet Take 1 tablet by mouth daily. 90 tablet 3   No facility-administered medications prior to visit.     Allergies  Allergen Reactions  . Penicillins     Rash     ROS Review of Systems  Constitutional: Negative.   HENT: Negative.   Eyes: Negative.   Respiratory: Negative.   Cardiovascular: Negative.   Gastrointestinal: Negative.   Endocrine: Negative.   Genitourinary: Negative.   Musculoskeletal: Negative.   Skin: Negative.   Allergic/Immunologic: Negative.   Neurological: Negative.   Hematological: Negative.   Psychiatric/Behavioral: Negative.   All other systems reviewed and are negative.     Objective:    Physical Exam  Constitutional: He is oriented  to person, place, and time. He appears well-developed and well-nourished. No distress.  Cardiovascular: Normal rate and regular rhythm.  Pulmonary/Chest: Effort normal. No respiratory distress.  Neurological: He is alert and oriented to person, place, and time.  Skin: Skin is warm and dry. No rash noted. He is not diaphoretic. No erythema. No pallor.  Psychiatric: He has a normal mood and affect. His behavior is normal. Judgment and thought content normal.  Nursing note and vitals reviewed.   BP (!) 154/84   Pulse 83   Temp 97.9 F (36.6 C) (Oral)   Resp 16   Ht 6' 2.61" (1.895 m)   Wt 205 lb (93 kg)   SpO2 97%   BMI 25.89 kg/m  Wt Readings from Last 3 Encounters:  05/05/19 205 lb (93 kg)  10/20/18 210 lb (95.3 kg)  06/10/18 200 lb 9.6 oz (91 kg)     Health Maintenance Due  Topic Date Due  . TETANUS/TDAP  09/09/1969  . PNA vac Low Risk Adult (1 of 2 - PCV13) 09/10/2015    There are no preventive care reminders to display for this patient.  Lab Results  Component Value Date   TSH 1.480 10/25/2016   Lab Results  Component Value Date   WBC 5.1 05/05/2019   HGB 13.7 05/05/2019   HCT 37.8 05/05/2019   MCV 90 05/05/2019   PLT 245 05/05/2019   Lab Results  Component Value Date   NA 124 (L) 05/05/2019   K 3.8 05/05/2019   CO2 23 05/05/2019   GLUCOSE 77 05/05/2019   BUN 9 05/05/2019   CREATININE 0.80 05/05/2019   BILITOT 0.6 05/05/2019   ALKPHOS 47 05/05/2019   AST 22 05/05/2019   ALT 16 05/05/2019   PROT 6.8 05/05/2019   ALBUMIN 4.8 05/05/2019   CALCIUM 9.4 05/05/2019   Lab Results  Component Value Date   CHOL 202 (H) 05/05/2019   Lab Results  Component Value Date   HDL 103 05/05/2019   Lab Results  Component Value Date   LDLCALC 86 05/05/2019   Lab Results  Component Value Date   TRIG 75 05/05/2019   Lab Results  Component Value Date   CHOLHDL 2.0 05/05/2019   No results found for: HGBA1C    Assessment & Plan:   Problem List Items  Addressed This Visit  None    Visit Diagnoses    Essential hypertension    -  Primary   Relevant Medications   amLODipine (NORVASC) 5 MG tablet   lisinopril-hydrochlorothiazide (ZESTORETIC) 20-25 MG tablet   Other Relevant Orders   Comprehensive metabolic panel (Completed)   Lipid panel (Completed)   CBC (Completed)   Hyponatremia       Relevant Orders   Comprehensive metabolic panel (Completed)   Lipid screening       Relevant Orders   Lipid panel (Completed)      Meds ordered this encounter  Medications  . amLODipine (NORVASC) 5 MG tablet    Sig: Take 1 tablet (5 mg total) by mouth daily.    Dispense:  90 tablet    Refill:  3    Order Specific Question:   Supervising Provider    Answer:   Delia Chimes A [id number]  . lisinopril-hydrochlorothiazide (ZESTORETIC) 20-25 MG tablet    Sig: Take 1 tablet by mouth daily.    Dispense:  90 tablet    Refill:  3    Order Specific Question:   Supervising Provider    Answer:   Forrest Moron [id number]    Follow-up: No follow-ups on file.   PLAN  Labs ordered, will follow up as warranted  Refill amlodipine and lisinopril-HCTZ for 6 mos - will hope for CPE at that time, will follow up on any necessary labs  BP mildly elevated today, encouraged pt to return in 2 weeks for BP check. He states he is very anxious being at a medical office during [place].  Patient encouraged to call clinic with any questions, comments, or concerns.   Maximiano Coss, NP

## 2019-05-11 ENCOUNTER — Encounter: Payer: Self-pay | Admitting: Registered Nurse

## 2019-05-11 ENCOUNTER — Other Ambulatory Visit: Payer: Self-pay | Admitting: Registered Nurse

## 2019-05-11 DIAGNOSIS — I10 Essential (primary) hypertension: Secondary | ICD-10-CM

## 2019-05-11 MED ORDER — LISINOPRIL 40 MG PO TABS: 40.0000 mg | ORAL_TABLET | Freq: Every day | ORAL | 3 refills | Status: DC

## 2019-05-11 MED ORDER — AMLODIPINE BESYLATE 10 MG PO TABS: 10.0000 mg | ORAL_TABLET | Freq: Every day | ORAL | 3 refills | Status: DC

## 2019-05-11 NOTE — Progress Notes (Signed)
Pt labs show hyponatremia and hypochloremia - however, no clear etiology based on other results being largely wnl.  Left vm with patient. Suspect HCTZ use is contributing to this. Will have him stop lisinopril-HCTZ 20-25mg  PO qd and start lisinopril 40mg  PO qd and increase his amlodipine to 10mg  PO qd. Ideally he will present two weeks after starting this regimen for BP check and follow up CMP draw.  Kathrin Ruddy, NP

## 2019-12-10 ENCOUNTER — Telehealth: Payer: Self-pay | Admitting: Registered Nurse

## 2019-12-10 ENCOUNTER — Telehealth: Payer: Self-pay | Admitting: *Deleted

## 2019-12-10 NOTE — Telephone Encounter (Signed)
Pt returning Julies call. Wants to set up awv. He will try to pick up Monday .

## 2019-12-10 NOTE — Telephone Encounter (Signed)
Schedule AWV.  

## 2020-04-05 DIAGNOSIS — M9901 Segmental and somatic dysfunction of cervical region: Secondary | ICD-10-CM | POA: Diagnosis not present

## 2020-04-05 DIAGNOSIS — M5010 Cervical disc disorder with radiculopathy, unspecified cervical region: Secondary | ICD-10-CM | POA: Diagnosis not present

## 2020-04-05 DIAGNOSIS — M9902 Segmental and somatic dysfunction of thoracic region: Secondary | ICD-10-CM | POA: Diagnosis not present

## 2020-04-11 DIAGNOSIS — M9901 Segmental and somatic dysfunction of cervical region: Secondary | ICD-10-CM | POA: Diagnosis not present

## 2020-04-11 DIAGNOSIS — M9902 Segmental and somatic dysfunction of thoracic region: Secondary | ICD-10-CM | POA: Diagnosis not present

## 2020-04-11 DIAGNOSIS — M5010 Cervical disc disorder with radiculopathy, unspecified cervical region: Secondary | ICD-10-CM | POA: Diagnosis not present

## 2020-04-25 DIAGNOSIS — M9901 Segmental and somatic dysfunction of cervical region: Secondary | ICD-10-CM | POA: Diagnosis not present

## 2020-04-25 DIAGNOSIS — M9902 Segmental and somatic dysfunction of thoracic region: Secondary | ICD-10-CM | POA: Diagnosis not present

## 2020-04-25 DIAGNOSIS — M5010 Cervical disc disorder with radiculopathy, unspecified cervical region: Secondary | ICD-10-CM | POA: Diagnosis not present

## 2020-04-27 DIAGNOSIS — M9901 Segmental and somatic dysfunction of cervical region: Secondary | ICD-10-CM | POA: Diagnosis not present

## 2020-04-27 DIAGNOSIS — M9902 Segmental and somatic dysfunction of thoracic region: Secondary | ICD-10-CM | POA: Diagnosis not present

## 2020-04-27 DIAGNOSIS — M5010 Cervical disc disorder with radiculopathy, unspecified cervical region: Secondary | ICD-10-CM | POA: Diagnosis not present

## 2020-05-03 DIAGNOSIS — M5010 Cervical disc disorder with radiculopathy, unspecified cervical region: Secondary | ICD-10-CM | POA: Diagnosis not present

## 2020-05-03 DIAGNOSIS — M9902 Segmental and somatic dysfunction of thoracic region: Secondary | ICD-10-CM | POA: Diagnosis not present

## 2020-05-03 DIAGNOSIS — M9901 Segmental and somatic dysfunction of cervical region: Secondary | ICD-10-CM | POA: Diagnosis not present

## 2020-05-04 ENCOUNTER — Other Ambulatory Visit: Payer: Self-pay | Admitting: Registered Nurse

## 2020-05-04 DIAGNOSIS — I10 Essential (primary) hypertension: Secondary | ICD-10-CM

## 2020-05-04 NOTE — Telephone Encounter (Signed)
Requested medication (s) are due for refill today: no  Requested medication (s) are on the active medication list: yes   Last refill:  02/15/2020  Future visit scheduled: no  Notes to clinic:  overdue for office visit    Requested Prescriptions  Pending Prescriptions Disp Refills   lisinopril (ZESTRIL) 40 MG tablet [Pharmacy Med Name: LISINOPRIL 40 MG TAB] 90 tablet 3    Sig: TAKE 1 TABLET BY MOUTH DAILY      Cardiovascular:  ACE Inhibitors Failed - 05/04/2020 10:59 AM      Failed - Cr in normal range and within 180 days    Creat  Date Value Ref Range Status  08/28/2015 0.75 0.70 - 1.25 mg/dL Final   Creatinine, Ser  Date Value Ref Range Status  05/05/2019 0.80 0.76 - 1.27 mg/dL Final          Failed - K in normal range and within 180 days    Potassium  Date Value Ref Range Status  05/05/2019 3.8 3.5 - 5.2 mmol/L Final          Failed - Last BP in normal range    BP Readings from Last 1 Encounters:  05/05/19 (!) 154/84          Failed - Valid encounter within last 6 months    Recent Outpatient Visits           1 year ago Essential hypertension   [facility], Gerlene Burdock, NP   1 year ago Essential hypertension, benign   [facility], Manus Rudd, MD   2 years ago Essential hypertension   [facility], Marolyn Hammock, PA-C   3 years ago Dysthymia   [facility], Marolyn Hammock, PA-C   3 years ago Diarrhea of presumed infectious origin   [facility], Marolyn Hammock, PA-C              Passed - Patient is not pregnant        amLODipine (NORVASC) 10 MG tablet [Pharmacy Med Name: AMLODIPINE BESYLATE 10 MG TAB] 90 tablet 3    Sig: TAKE 1 TABLET BY MOUTH DAILY      Cardiovascular:  Calcium Channel Blockers Failed - 05/04/2020 10:59 AM      Failed - Last BP in normal range    BP Readings from Last 1 Encounters:  05/05/19 (!) 154/84          Failed - Valid encounter within last 6 months     Recent Outpatient Visits           1 year ago Essential hypertension   [facility], Gerlene Burdock, NP   1 year ago Essential hypertension, benign   [facility], Manus Rudd, MD   2 years ago Essential hypertension   [facility], Marolyn Hammock, PA-C   3 years ago Dysthymia   [facility], Marolyn Hammock, PA-C   3 years ago Diarrhea of presumed infectious origin   [facility] at [facility], Marolyn Hammock, PA-C

## 2020-05-06 ENCOUNTER — Other Ambulatory Visit: Payer: Self-pay | Admitting: Registered Nurse

## 2020-05-06 DIAGNOSIS — I10 Essential (primary) hypertension: Secondary | ICD-10-CM

## 2020-05-06 NOTE — Telephone Encounter (Signed)
Requested medication (s) are due for refill today: no  Requested medication (s) are on the active medication list: yes  Last refill:  02/15/2020  Future visit scheduled: no  Notes to clinic:  overdue for office visit    Requested Prescriptions  Pending Prescriptions Disp Refills   amLODipine (NORVASC) 10 MG tablet [Pharmacy Med Name: AMLODIPINE BESYLATE 10 MG TAB] 90 tablet 3    Sig: TAKE 1 TABLET BY MOUTH DAILY      Cardiovascular:  Calcium Channel Blockers Failed - 05/06/2020 11:05 AM      Failed - Last BP in normal range    BP Readings from Last 1 Encounters:  05/05/19 (!) 154/84          Failed - Valid encounter within last 6 months    Recent Outpatient Visits           1 year ago Essential hypertension   [facility], Gerlene Burdock, NP   1 year ago Essential hypertension, benign   [facility], Manus Rudd, MD   2 years ago Essential hypertension   [facility], Marolyn Hammock, PA-C   3 years ago Dysthymia   [facility], Marolyn Hammock, PA-C   3 years ago Diarrhea of presumed infectious origin   [facility] at [facility], Marolyn Hammock, PA-C                lisinopril (ZESTRIL) 40 MG tablet [Pharmacy Med Name: LISINOPRIL 40 MG TAB] 90 tablet 3    Sig: TAKE 1 TABLET BY MOUTH DAILY      Cardiovascular:  ACE Inhibitors Failed - 05/06/2020 11:05 AM      Failed - Cr in normal range and within 180 days    Creat  Date Value Ref Range Status  08/28/2015 0.75 0.70 - 1.25 mg/dL Final   Creatinine, Ser  Date Value Ref Range Status  05/05/2019 0.80 0.76 - 1.27 mg/dL Final          Failed - K in normal range and within 180 days    Potassium  Date Value Ref Range Status  05/05/2019 3.8 3.5 - 5.2 mmol/L Final          Failed - Last BP in normal range    BP Readings from Last 1 Encounters:  05/05/19 (!) 154/84          Failed - Valid encounter within last 6 months    Recent Outpatient Visits           1  year ago Essential hypertension   [facility], Gerlene Burdock, NP   1 year ago Essential hypertension, benign   [facility], Manus Rudd, MD   2 years ago Essential hypertension   [facility], Marolyn Hammock, PA-C   3 years ago Dysthymia   [facility], Marolyn Hammock, PA-C   3 years ago Diarrhea of presumed infectious origin   [facility], Marolyn Hammock, PA-C              Passed - Patient is not pregnant

## 2020-05-10 ENCOUNTER — Other Ambulatory Visit: Payer: Self-pay | Admitting: Registered Nurse

## 2020-05-10 DIAGNOSIS — I10 Essential (primary) hypertension: Secondary | ICD-10-CM

## 2020-05-11 ENCOUNTER — Ambulatory Visit: Payer: BC Managed Care – PPO | Admitting: Registered Nurse

## 2020-05-19 ENCOUNTER — Ambulatory Visit: Payer: BC Managed Care – PPO | Admitting: Family Medicine

## 2021-05-27 ENCOUNTER — Other Ambulatory Visit: Payer: Self-pay

## 2021-05-27 ENCOUNTER — Encounter (HOSPITAL_COMMUNITY): Payer: Self-pay | Admitting: Emergency Medicine

## 2021-05-27 ENCOUNTER — Inpatient Hospital Stay (HOSPITAL_COMMUNITY)
Admission: EM | Admit: 2021-05-27 | Discharge: 2021-06-12 | DRG: 871 | Disposition: A | Discharge status: Home Health | Payer: Medicare Other | Attending: Internal Medicine | Admitting: Internal Medicine

## 2021-05-27 ENCOUNTER — Emergency Department (HOSPITAL_COMMUNITY): Payer: Medicare Other

## 2021-05-27 DIAGNOSIS — R4182 Altered mental status, unspecified: Secondary | ICD-10-CM | POA: Diagnosis not present

## 2021-05-27 DIAGNOSIS — R230 Cyanosis: Secondary | ICD-10-CM | POA: Diagnosis not present

## 2021-05-27 DIAGNOSIS — F32A Depression, unspecified: Secondary | ICD-10-CM | POA: Diagnosis present

## 2021-05-27 DIAGNOSIS — G9341 Metabolic encephalopathy: Secondary | ICD-10-CM | POA: Diagnosis present

## 2021-05-27 DIAGNOSIS — T68XXXA Hypothermia, initial encounter: Secondary | ICD-10-CM

## 2021-05-27 DIAGNOSIS — J329 Chronic sinusitis, unspecified: Secondary | ICD-10-CM | POA: Diagnosis not present

## 2021-05-27 DIAGNOSIS — E876 Hypokalemia: Secondary | ICD-10-CM | POA: Diagnosis present

## 2021-05-27 DIAGNOSIS — I5022 Chronic systolic (congestive) heart failure: Secondary | ICD-10-CM | POA: Diagnosis present

## 2021-05-27 DIAGNOSIS — R52 Pain, unspecified: Secondary | ICD-10-CM

## 2021-05-27 DIAGNOSIS — J3489 Other specified disorders of nose and nasal sinuses: Secondary | ICD-10-CM | POA: Diagnosis not present

## 2021-05-27 DIAGNOSIS — J1282 Pneumonia due to coronavirus disease 2019: Secondary | ICD-10-CM | POA: Diagnosis present

## 2021-05-27 DIAGNOSIS — M6282 Rhabdomyolysis: Secondary | ICD-10-CM | POA: Diagnosis present

## 2021-05-27 DIAGNOSIS — U071 COVID-19: Secondary | ICD-10-CM | POA: Diagnosis not present

## 2021-05-27 DIAGNOSIS — S199XXA Unspecified injury of neck, initial encounter: Secondary | ICD-10-CM | POA: Diagnosis not present

## 2021-05-27 DIAGNOSIS — K5730 Diverticulosis of large intestine without perforation or abscess without bleeding: Secondary | ICD-10-CM | POA: Diagnosis not present

## 2021-05-27 DIAGNOSIS — N281 Cyst of kidney, acquired: Secondary | ICD-10-CM | POA: Diagnosis not present

## 2021-05-27 DIAGNOSIS — S2241XA Multiple fractures of ribs, right side, initial encounter for closed fracture: Secondary | ICD-10-CM | POA: Diagnosis present

## 2021-05-27 DIAGNOSIS — Z91138 Patient's unintentional underdosing of medication regimen for other reason: Secondary | ICD-10-CM

## 2021-05-27 DIAGNOSIS — F10231 Alcohol dependence with withdrawal delirium: Secondary | ICD-10-CM | POA: Diagnosis present

## 2021-05-27 DIAGNOSIS — L89152 Pressure ulcer of sacral region, stage 2: Secondary | ICD-10-CM | POA: Diagnosis present

## 2021-05-27 DIAGNOSIS — S0990XA Unspecified injury of head, initial encounter: Secondary | ICD-10-CM | POA: Diagnosis not present

## 2021-05-27 DIAGNOSIS — E860 Dehydration: Secondary | ICD-10-CM | POA: Diagnosis present

## 2021-05-27 DIAGNOSIS — D751 Secondary polycythemia: Secondary | ICD-10-CM | POA: Diagnosis present

## 2021-05-27 DIAGNOSIS — A4189 Other specified sepsis: Principal | ICD-10-CM | POA: Diagnosis present

## 2021-05-27 DIAGNOSIS — I700 Atherosclerosis of aorta: Secondary | ICD-10-CM | POA: Diagnosis not present

## 2021-05-27 DIAGNOSIS — R0602 Shortness of breath: Secondary | ICD-10-CM

## 2021-05-27 DIAGNOSIS — I248 Other forms of acute ischemic heart disease: Secondary | ICD-10-CM | POA: Diagnosis present

## 2021-05-27 DIAGNOSIS — Z808 Family history of malignant neoplasm of other organs or systems: Secondary | ICD-10-CM

## 2021-05-27 DIAGNOSIS — Y92019 Unspecified place in single-family (private) house as the place of occurrence of the external cause: Secondary | ICD-10-CM

## 2021-05-27 DIAGNOSIS — M160 Bilateral primary osteoarthritis of hip: Secondary | ICD-10-CM | POA: Diagnosis present

## 2021-05-27 DIAGNOSIS — T464X6A Underdosing of angiotensin-converting-enzyme inhibitors, initial encounter: Secondary | ICD-10-CM | POA: Diagnosis present

## 2021-05-27 DIAGNOSIS — I110 Hypertensive heart disease with heart failure: Secondary | ICD-10-CM | POA: Diagnosis present

## 2021-05-27 DIAGNOSIS — R1310 Dysphagia, unspecified: Secondary | ICD-10-CM | POA: Diagnosis present

## 2021-05-27 DIAGNOSIS — Z87891 Personal history of nicotine dependence: Secondary | ICD-10-CM

## 2021-05-27 DIAGNOSIS — L8990 Pressure ulcer of unspecified site, unspecified stage: Secondary | ICD-10-CM | POA: Insufficient documentation

## 2021-05-27 DIAGNOSIS — G9340 Encephalopathy, unspecified: Secondary | ICD-10-CM | POA: Diagnosis not present

## 2021-05-27 DIAGNOSIS — E871 Hypo-osmolality and hyponatremia: Secondary | ICD-10-CM

## 2021-05-27 DIAGNOSIS — R6520 Severe sepsis without septic shock: Secondary | ICD-10-CM | POA: Diagnosis present

## 2021-05-27 DIAGNOSIS — R404 Transient alteration of awareness: Secondary | ICD-10-CM | POA: Diagnosis not present

## 2021-05-27 DIAGNOSIS — T43226A Underdosing of selective serotonin reuptake inhibitors, initial encounter: Secondary | ICD-10-CM | POA: Diagnosis present

## 2021-05-27 DIAGNOSIS — A419 Sepsis, unspecified organism: Secondary | ICD-10-CM

## 2021-05-27 DIAGNOSIS — Z79899 Other long term (current) drug therapy: Secondary | ICD-10-CM

## 2021-05-27 DIAGNOSIS — J9811 Atelectasis: Secondary | ICD-10-CM | POA: Diagnosis not present

## 2021-05-27 DIAGNOSIS — J690 Pneumonitis due to inhalation of food and vomit: Secondary | ICD-10-CM | POA: Diagnosis present

## 2021-05-27 DIAGNOSIS — M542 Cervicalgia: Secondary | ICD-10-CM | POA: Diagnosis not present

## 2021-05-27 DIAGNOSIS — R0902 Hypoxemia: Secondary | ICD-10-CM | POA: Diagnosis not present

## 2021-05-27 DIAGNOSIS — R45851 Suicidal ideations: Secondary | ICD-10-CM | POA: Diagnosis not present

## 2021-05-27 DIAGNOSIS — T461X6A Underdosing of calcium-channel blockers, initial encounter: Secondary | ICD-10-CM | POA: Diagnosis present

## 2021-05-27 DIAGNOSIS — R410 Disorientation, unspecified: Secondary | ICD-10-CM | POA: Diagnosis not present

## 2021-05-27 DIAGNOSIS — R0689 Other abnormalities of breathing: Secondary | ICD-10-CM | POA: Diagnosis not present

## 2021-05-27 DIAGNOSIS — I10 Essential (primary) hypertension: Secondary | ICD-10-CM | POA: Diagnosis not present

## 2021-05-27 DIAGNOSIS — N179 Acute kidney failure, unspecified: Secondary | ICD-10-CM | POA: Diagnosis present

## 2021-05-27 DIAGNOSIS — Z8249 Family history of ischemic heart disease and other diseases of the circulatory system: Secondary | ICD-10-CM

## 2021-05-27 DIAGNOSIS — D6959 Other secondary thrombocytopenia: Secondary | ICD-10-CM | POA: Diagnosis present

## 2021-05-27 DIAGNOSIS — E222 Syndrome of inappropriate secretion of antidiuretic hormone: Secondary | ICD-10-CM | POA: Diagnosis present

## 2021-05-27 DIAGNOSIS — R55 Syncope and collapse: Secondary | ICD-10-CM | POA: Diagnosis not present

## 2021-05-27 DIAGNOSIS — Z880 Allergy status to penicillin: Secondary | ICD-10-CM

## 2021-05-27 DIAGNOSIS — D75838 Other thrombocytosis: Secondary | ICD-10-CM | POA: Diagnosis present

## 2021-05-27 LAB — CBC WITH DIFFERENTIAL/PLATELET
Abs Immature Granulocytes: 0.42 10*3/uL — ABNORMAL HIGH (ref 0.00–0.07)
Basophils Absolute: 0.1 10*3/uL (ref 0.0–0.1)
Basophils Relative: 0 %
Eosinophils Absolute: 0 10*3/uL (ref 0.0–0.5)
Eosinophils Relative: 0 %
HCT: 49.3 % (ref 39.0–52.0)
Hemoglobin: 18 g/dL — ABNORMAL HIGH (ref 13.0–17.0)
Immature Granulocytes: 2 %
Lymphocytes Relative: 5 %
Lymphs Abs: 1.4 10*3/uL (ref 0.7–4.0)
MCH: 32.1 pg (ref 26.0–34.0)
MCHC: 36.5 g/dL — ABNORMAL HIGH (ref 30.0–36.0)
MCV: 87.9 fL (ref 80.0–100.0)
Monocytes Absolute: 1.1 10*3/uL — ABNORMAL HIGH (ref 0.1–1.0)
Monocytes Relative: 5 %
Neutro Abs: 22.4 10*3/uL — ABNORMAL HIGH (ref 1.7–7.7)
Neutrophils Relative %: 88 %
Platelets: 557 10*3/uL — ABNORMAL HIGH (ref 150–400)
RBC: 5.61 MIL/uL (ref 4.22–5.81)
RDW: 12.4 % (ref 11.5–15.5)
WBC: 25.4 10*3/uL — ABNORMAL HIGH (ref 4.0–10.5)
nRBC: 0 % (ref 0.0–0.2)

## 2021-05-27 LAB — I-STAT VENOUS BLOOD GAS, ED
Acid-Base Excess: 2 mmol/L (ref 0.0–2.0)
Bicarbonate: 28.9 mmol/L — ABNORMAL HIGH (ref 20.0–28.0)
Calcium, Ion: 1.02 mmol/L — ABNORMAL LOW (ref 1.15–1.40)
HCT: 54 % — ABNORMAL HIGH (ref 39.0–52.0)
Hemoglobin: 18.4 g/dL — ABNORMAL HIGH (ref 13.0–17.0)
O2 Saturation: 48 %
Potassium: 3.4 mmol/L — ABNORMAL LOW (ref 3.5–5.1)
Sodium: 125 mmol/L — ABNORMAL LOW (ref 135–145)
TCO2: 30 mmol/L (ref 22–32)
pCO2, Ven: 49.8 mmHg (ref 44.0–60.0)
pH, Ven: 7.371 (ref 7.250–7.430)
pO2, Ven: 27 mmHg — CL (ref 32.0–45.0)

## 2021-05-27 LAB — LACTIC ACID, PLASMA: Lactic Acid, Venous: 4.3 mmol/L (ref 0.5–1.9)

## 2021-05-27 LAB — PROTIME-INR
INR: 0.9 (ref 0.8–1.2)
Prothrombin Time: 12 seconds (ref 11.4–15.2)

## 2021-05-27 MED ORDER — SODIUM CHLORIDE 0.9 % IV SOLN
2.0000 g | Freq: Once | INTRAVENOUS | Status: AC
  Administered 2021-05-28: 2 g via INTRAVENOUS
  Filled 2021-05-27: qty 2

## 2021-05-27 MED ORDER — LACTATED RINGERS IV SOLN: INTRAVENOUS | Status: DC

## 2021-05-27 MED ORDER — VANCOMYCIN HCL 2000 MG/400ML IV SOLN
2000.0000 mg | Freq: Once | INTRAVENOUS | Status: AC
  Administered 2021-05-28: 2000 mg via INTRAVENOUS
  Filled 2021-05-27: qty 400

## 2021-05-27 MED ORDER — METRONIDAZOLE 500 MG/100ML IV SOLN
500.0000 mg | Freq: Once | INTRAVENOUS | Status: AC
  Administered 2021-05-28: 500 mg via INTRAVENOUS
  Filled 2021-05-27: qty 100

## 2021-05-27 MED ORDER — SODIUM CHLORIDE 0.9 % IV SOLN: INTRAVENOUS | Status: DC

## 2021-05-27 MED ORDER — SODIUM CHLORIDE 0.9 % IV BOLUS: 1000.0000 mL | Freq: Once | INTRAVENOUS | Status: DC

## 2021-05-27 NOTE — Progress Notes (Signed)
Pt being followed by [facility] for Sepsis protocol. 

## 2021-05-27 NOTE — ED Triage Notes (Signed)
Patient from home, had not been heard from for about 2 days. Police went to house for welfare check. Patient was found on the floor, alert, confused shivering and weak.  Patient with bruising on arms, left chest, face, abdomen, bilateral knees.  Feet were purple upon EMS arrival. Patient complaining of congestion and neck pain.  Hx of alcohol abuse.  CBG 182.  Tachycardic.

## 2021-05-27 NOTE — ED Provider Notes (Signed)
[facility] EMERGENCY DEPARTMENT Provider Note   CSN: [id number] Arrival date & time: 05/27/21  2204     History Chief Complaint  Patient presents with   Altered Mental Status    Fred Graham is a 70 y.o. male who presents via EMS after being found down at time of welfare evaluation by law enforcement.  Per EMS, family had not heard from the patient in several days and called for welfare check and police found the patient down in his underwear on the wood floor of his home, very cold to the touch with apparently very little to no heat on in the home.  Patient was altered and very cold to the touch, unable to detect temperature or SPO2 in route due to very cold extremities.  Per EMS unable to get EKG due to patient shivering and very weak.  Patient with bruising on the arms, left face, abdomen, chest, and bilateral knees.  Per EMS feet were purple upon their arrival  Level 5 caveat due to acuity of patient's presentation upon arrival.  I personally reviewed this patient's medical records.  He has history of hypertension but does not appear to be taking his medications.  Significant history of heavy alcohol abuse.  Patient's daughter did present to the ED bedside after initial evaluation contributes that the patient regularly goes to the bar almost daily and drinks all day long.  She states that she has not seen him in person since of temper but she speaks with him via phone a few times a week.  Has not heard from him since Tuesday of this past week.  HPI     Past Medical History:  Diagnosis Date   Allergy    Arthritis    hands, hips   Hypertension     Patient Active Problem List   Diagnosis Date Noted   Dysthymia 06/07/2017   Essential hypertension, benign 02/22/2013    History reviewed. No pertinent surgical history.     Family History  Problem Relation Age of Onset   Cancer Brother        skin cancer   Heart disease Brother 5       cardiac  stenting   Depression Father    Heart disease Father 55       CABG    Social History   Tobacco Use   Smoking status: Former    Packs/day: 1.00    Years: 5.00    Pack years: 5.00    Types: Cigarettes    Quit date: 10/19/1993    Years since quitting: 27.6   Smokeless tobacco: Never  Vaping Use   Vaping Use: Never used  Substance Use Topics   Alcohol use: Yes    Alcohol/week: 10.0 standard drinks    Types: 10 Cans of beer per week   Drug use: No    Home Medications Prior to Admission medications   Medication Sig Start Date End Date Taking? Authorizing Provider  loperamide (IMODIUM A-D) 2 MG tablet Take 1 tablet (2 mg total) by mouth 4 (four) times daily as needed for diarrhea or loose stools. 10/25/16  Yes Tereasa Coop, PA-C  amLODipine (NORVASC) 10 MG tablet Take 1 tablet (10 mg total) by mouth daily. Patient not taking: Reported on 05/27/2021 05/11/19   Maximiano Coss, NP  escitalopram (LEXAPRO) 5 MG tablet Take 1 tablet (5 mg total) by mouth at bedtime. Take with a snack.  Do not miss doses. Patient not taking: Reported on  05/27/2021 06/10/18   Doristine Bosworth, MD  lisinopril (ZESTRIL) 40 MG tablet Take 1 tablet (40 mg total) by mouth daily. Patient not taking: Reported on 05/27/2021 05/11/19   [name], NP  lisinopril-hydrochlorothiazide (ZESTORETIC) 20-25 MG tablet Take 1 tablet by mouth daily. Patient not taking: Reported on 05/27/2021 05/05/19   [name], NP  psyllium (METAMUCIL SMOOTH TEXTURE) 28 % packet Take 1 packet by mouth daily. Start with 1/2 pack daily for three days then move to the full pack daily. Patient not taking: Reported on 05/27/2021 12/06/16   Ofilia Neas, PA-C    Allergies    Penicillins  Review of Systems   Review of Systems  Unable to perform ROS: Acuity of condition   Physical Exam Updated Vital Signs BP 110/65   Pulse 90   Temp (!) 95.7 F (35.4 C) (Rectal)   Resp 14   Ht 6\' 2"  (1.88 m)   Wt 103.6 kg   SpO2 97%    BMI 29.34 kg/m   Physical Exam Vitals and nursing note reviewed.  Constitutional:      Appearance: He is toxic-appearing.  HENT:     Head: Normocephalic.      Nose: Congestion present.     Comments: Crusted debris, what appears like dried emesis, in bilateral nares    Mouth/Throat:     Mouth: Mucous membranes are dry.     Pharynx: Oropharynx is clear. Uvula midline. No oropharyngeal exudate or posterior oropharyngeal erythema.     Tonsils: No tonsillar exudate.  Eyes:     General: Lids are normal. Vision grossly intact.        Right eye: No discharge.        Left eye: No discharge.     Extraocular Movements: Extraocular movements intact.     Conjunctiva/sclera:     Right eye: Right conjunctiva is injected.     Left eye: Left conjunctiva is injected.     Pupils: Pupils are equal, round, and reactive to light.  Neck:     Trachea: Trachea and phonation normal.  Cardiovascular:     Rate and Rhythm: Regular rhythm. Tachycardia present.     Pulses:          Popliteal pulses are 0 on the right side and 0 on the left side.       Dorsalis pedis pulses are 0 on the right side and 0 on the left side.     Heart sounds: Normal heart sounds. No murmur heard.    Comments: Unable to Doppler pulses DP or PT bilaterally on intake.  Reevaluated after patient was much warmer having been on Bair hugger for some time; left foot is not weeping.  Patient's feet are much warmer to the touch, however cap refill remains approximately 5 seconds in all toes bilaterally and I still not able to Doppler DP or PT pulses.  Patient states feet do not hurt and is able to move them without difficulty. Pulmonary:     Effort: Pulmonary effort is normal. Tachypnea present. No bradypnea, accessory muscle usage, prolonged expiration or respiratory distress.     Breath sounds: Examination of the right-lower field reveals decreased breath sounds. Examination of the left-lower field reveals decreased breath sounds.  Decreased breath sounds present. No wheezing or rales.  Chest:     Chest wall: No mass, lacerations, deformity, swelling, tenderness, crepitus or edema.    Abdominal:     General: Bowel sounds are normal. There is no distension. There  are signs of injury.     Palpations: Abdomen is soft.     Tenderness: There is no abdominal tenderness. There is no right CVA tenderness, left CVA tenderness, guarding or rebound.    Genitourinary:    Comments: No apparent decubitus ulcers in the perianal area or sacrum Musculoskeletal:        General: No deformity.     Cervical back: Normal range of motion and neck supple. No edema, rigidity, tenderness or crepitus. No pain with movement, spinous process tenderness or muscular tenderness.     Right lower leg: No edema.     Left lower leg: No edema.  Lymphadenopathy:     Cervical: No cervical adenopathy.  Skin:    General: Skin is warm and dry.     Capillary Refill: Capillary refill takes more than 3 seconds.     Coloration: Skin is mottled and pale.     Findings: Bruising present.  Neurological:     General: No focal deficit present.     Mental Status: He is oriented to person, place, and time and easily aroused.     GCS: GCS eye subscore is 4. GCS verbal subscore is 5. GCS motor subscore is 5.     Sensory: Sensation is intact.     Comments: Global weakness, shivering  Psychiatric:        Mood and Affect: Mood normal.    ED Results / Procedures / Treatments   Labs (all labs ordered are listed, but only abnormal results are displayed) Labs Reviewed  COMPREHENSIVE METABOLIC PANEL - Abnormal; Notable for the following components:      Result Value   Sodium 125 (*)    Chloride 84 (*)    Glucose, Bld 143 (*)    BUN 25 (*)    AST 209 (*)    ALT 103 (*)    Total Bilirubin 1.5 (*)    All other components within normal limits  CBC WITH DIFFERENTIAL/PLATELET - Abnormal; Notable for the following components:   WBC 25.4 (*)    Hemoglobin 18.0 (*)     MCHC 36.5 (*)    Platelets 557 (*)    Neutro Abs 22.4 (*)    Monocytes Absolute 1.1 (*)    Abs Immature Granulocytes 0.42 (*)    All other components within normal limits  LACTIC ACID, PLASMA - Abnormal; Notable for the following components:   Lactic Acid, Venous 4.3 (*)    All other components within normal limits  CK - Abnormal; Notable for the following components:   Total CK 5,600 (*)    All other components within normal limits  I-STAT VENOUS BLOOD GAS, ED - Abnormal; Notable for the following components:   pO2, Ven 27.0 (*)    Bicarbonate 28.9 (*)    Sodium 125 (*)    Potassium 3.4 (*)    Calcium, Ion 1.02 (*)    HCT 54.0 (*)    Hemoglobin 18.4 (*)    All other components within normal limits  TROPONIN I (HIGH SENSITIVITY) - Abnormal; Notable for the following components:   Troponin I (High Sensitivity) 88 (*)    All other components within normal limits  CULTURE, BLOOD (ROUTINE X 2)  CULTURE, BLOOD (ROUTINE X 2)  RESP PANEL BY RT-PCR (FLU A&B, COVID) ARPGX2  PROTIME-INR  URINALYSIS, ROUTINE W REFLEX MICROSCOPIC  ETHANOL  RAPID URINE DRUG SCREEN, HOSP PERFORMED  APTT  CBG MONITORING, ED  CBG MONITORING, ED  TROPONIN I (HIGH SENSITIVITY)  EKG EKG Interpretation  Date/Time:  Saturday May 27 2021 22:35:52 EST Ventricular Rate:  91 PR Interval:  173 QRS Duration: 95 QT Interval:  418 QTC Calculation: 515 R Axis:   44 Text Interpretation: Sinus rhythm RSR' in V1 or V2, probably normal variant Prolonged QT interval No previous ECGs available Confirmed by Ezequiel Essex [phone]) on 05/28/2021 12:17:58 AM  Radiology DG Chest Port 1 View  Result Date: 05/27/2021 CLINICAL DATA:  Altered mental status, hypothermia EXAM: PORTABLE CHEST 1 VIEW COMPARISON:  None. FINDINGS: Heart and mediastinal contours are within normal limits. No focal opacities or effusions. No acute bony abnormality. IMPRESSION: No active cardiopulmonary disease. Electronically Signed    By: Rolm Baptise M.D.   On: 05/27/2021 22:41    Procedures .Critical Care Performed by: Emeline Darling, PA-C Authorized by: Emeline Darling, PA-C   Critical care provider statement:    Critical care time (minutes):  60   Critical care was time spent personally by me on the following activities:  Development of treatment plan with patient or surrogate, discussions with consultants, evaluation of patient's response to treatment, examination of patient, obtaining history from patient or surrogate, ordering and performing treatments and interventions, ordering and review of laboratory studies, ordering and review of radiographic studies, pulse oximetry and re-evaluation of patient's condition   Care discussed with: admitting provider     Medications Ordered in ED Medications  lactated ringers infusion (has no administration in time range)  ceFEPIme (MAXIPIME) 2 g in sodium chloride 0.9 % 100 mL IVPB (has no administration in time range)  metroNIDAZOLE (FLAGYL) IVPB 500 mg (has no administration in time range)  vancomycin (VANCOREADY) IVPB 2000 mg/400 mL (has no administration in time range)  lactated ringers bolus 1,000 mL (1,000 mLs Intravenous New Bag/Given 05/28/21 0033)    And  lactated ringers bolus 1,000 mL (1,000 mLs Intravenous New Bag/Given 05/28/21 0033)    ED Course  I have reviewed the triage vital signs and the nursing notes.  Pertinent labs & imaging results that were available during my care of the patient were reviewed by me and considered in my medical decision making (see chart for details).  Clinical Course as of 05/28/21 0035  Sat May 27, 2021  2223 Rectal temp 95 Degrees on intake. [RS]  [id number] Presented to the bedside for reevaluation of the patient found to be hypotensive with systolic in the [id number].  IV fluids were placed on pressure bag by this provider and he was placed in Trendelenburg with improvement in his SBP to 100s.  He remains altered in his mental  status.  Family at the bedside who states that he drinks alcohol heavily, goes to the bar daily, she has not seen him in person since September but does state that she spoke with him earlier this week and he appeared to be at his baseline. [RS]    Clinical Course User Index [RS] Jacques Fife, Sharlene Dory   MDM Rules/Calculators/A&P                         70 year old male who presents via EMS for altered mental status after being found down.  The differential diagnosis for AMS is extensive and includes, but is not limited to:  Drug overdose - opioids, alcohol, sedatives, antipsychotics, drug withdrawal, others Metabolic: hypoxia, hypoglycemia, hyperglycemia, hypercalcemia, hypernatremia, hyponatremia, uremia, hepatic encephalopathy, hypothyroidism, hyperthyroidism, vitamin B12 or thiamine deficiency, carbon monoxide poisoning, Wilson's disease, Lactic acidosis, DKA/HHOS Infectious: meningitis,  encephalitis, bacteremia/sepsis, urinary tract infection, pneumonia, neurosyphilis Structural: Space-occupying lesion, (brain tumor, subdural hematoma, hydrocephalus,) Vascular: stroke, subarachnoid hemorrhage, coronary ischemia, hypertensive encephalopathy, CNS vasculitis, thrombotic thrombocytopenic purpura, disseminated intravascular coagulation, hyperviscosity Psychiatric: Schizophrenia, depression; Other: Seizure, hypothermia, heat stroke, ICU psychosis, dementia -"sundowning."  Hypothermic with rectal temperature of 95 F, tachypneic on intake.  Cardiopulmonary exam revealed mild  tachycardia, abdominal exam is benign.  Patient is neurovascular intact in the upper extremities.  Patient is neurologically intact in lower extremities but vascular status is concerning as there is no dopplerable pulses in either foot.  Patient denies any pain, is moving them normally and endorses normal sensation.  Unclear if this is new vascular status change for him.  Fortunately patient's feet are warm and pink at this  time after being on the bear hugger for 2 hours.  CBC with leukocytosis of 25,000, CMP hyponatremia of 125, AST/ALT 209/103 and bili is elevated to 1.5.  Troponin is elevated 88.  VBG with normal pH.  CK pending.  Chest x-ray negative for acute cardiopulmonary disease.  Unidentified source antibiotics ordered per sepsis protocol.  Patient started on 1 L warm NS upon arrival and his LR ordered per sepsis order set.  Lactic elevated to 4.3.  CT head, C-spine and CT chest with contrast and CT angio aortobifemoral of the abdomen with runoff of the feet has been ordered.  Care of this patient signed out to oncoming ED provider Dr. Wyvonnia Dusky at time of shift change.  All pertinent HPI, physical exam, and laboratory findings were discussed with him prior to my departure.  I appreciate the collaboration of care with patient.  Arlander's daughter is at the bedside and voiced understanding of his medical evaluation and treatment plan thus far.  Each of her questions were answered to her expressed satisfaction.   This chart was dictated using voice recognition software, Dragon. Despite the best efforts of this provider to proofread and correct errors, errors may still occur which can change documentation meaning.  Final Clinical Impression(s) / ED Diagnoses Final diagnoses:  None    Rx / DC Orders ED Discharge Orders     None        Emeline Darling, PA-C 05/28/21 [place], [place], DO 05/28/21 [phone]

## 2021-05-28 ENCOUNTER — Emergency Department (HOSPITAL_COMMUNITY): Payer: Medicare Other

## 2021-05-28 ENCOUNTER — Inpatient Hospital Stay (HOSPITAL_COMMUNITY): Payer: Medicare Other

## 2021-05-28 DIAGNOSIS — R0602 Shortness of breath: Secondary | ICD-10-CM | POA: Diagnosis not present

## 2021-05-28 DIAGNOSIS — Z7401 Bed confinement status: Secondary | ICD-10-CM | POA: Diagnosis not present

## 2021-05-28 DIAGNOSIS — G319 Degenerative disease of nervous system, unspecified: Secondary | ICD-10-CM | POA: Diagnosis not present

## 2021-05-28 DIAGNOSIS — E222 Syndrome of inappropriate secretion of antidiuretic hormone: Secondary | ICD-10-CM | POA: Diagnosis present

## 2021-05-28 DIAGNOSIS — K5730 Diverticulosis of large intestine without perforation or abscess without bleeding: Secondary | ICD-10-CM | POA: Diagnosis not present

## 2021-05-28 DIAGNOSIS — F10231 Alcohol dependence with withdrawal delirium: Secondary | ICD-10-CM | POA: Diagnosis present

## 2021-05-28 DIAGNOSIS — J1282 Pneumonia due to coronavirus disease 2019: Secondary | ICD-10-CM | POA: Diagnosis not present

## 2021-05-28 DIAGNOSIS — U071 COVID-19: Secondary | ICD-10-CM | POA: Diagnosis not present

## 2021-05-28 DIAGNOSIS — R45851 Suicidal ideations: Secondary | ICD-10-CM | POA: Diagnosis not present

## 2021-05-28 DIAGNOSIS — J90 Pleural effusion, not elsewhere classified: Secondary | ICD-10-CM | POA: Diagnosis not present

## 2021-05-28 DIAGNOSIS — A419 Sepsis, unspecified organism: Secondary | ICD-10-CM | POA: Diagnosis present

## 2021-05-28 DIAGNOSIS — J329 Chronic sinusitis, unspecified: Secondary | ICD-10-CM | POA: Diagnosis not present

## 2021-05-28 DIAGNOSIS — R230 Cyanosis: Secondary | ICD-10-CM | POA: Diagnosis not present

## 2021-05-28 DIAGNOSIS — S199XXA Unspecified injury of neck, initial encounter: Secondary | ICD-10-CM | POA: Diagnosis not present

## 2021-05-28 DIAGNOSIS — R4182 Altered mental status, unspecified: Secondary | ICD-10-CM | POA: Diagnosis not present

## 2021-05-28 DIAGNOSIS — R410 Disorientation, unspecified: Secondary | ICD-10-CM | POA: Diagnosis not present

## 2021-05-28 DIAGNOSIS — J9811 Atelectasis: Secondary | ICD-10-CM | POA: Diagnosis not present

## 2021-05-28 DIAGNOSIS — F32A Depression, unspecified: Secondary | ICD-10-CM | POA: Diagnosis present

## 2021-05-28 DIAGNOSIS — M25551 Pain in right hip: Secondary | ICD-10-CM | POA: Diagnosis not present

## 2021-05-28 DIAGNOSIS — E876 Hypokalemia: Secondary | ICD-10-CM | POA: Diagnosis present

## 2021-05-28 DIAGNOSIS — G9341 Metabolic encephalopathy: Secondary | ICD-10-CM

## 2021-05-28 DIAGNOSIS — L89152 Pressure ulcer of sacral region, stage 2: Secondary | ICD-10-CM | POA: Diagnosis present

## 2021-05-28 DIAGNOSIS — D6959 Other secondary thrombocytopenia: Secondary | ICD-10-CM | POA: Diagnosis present

## 2021-05-28 DIAGNOSIS — R0902 Hypoxemia: Secondary | ICD-10-CM | POA: Diagnosis not present

## 2021-05-28 DIAGNOSIS — N3289 Other specified disorders of bladder: Secondary | ICD-10-CM | POA: Diagnosis not present

## 2021-05-28 DIAGNOSIS — J690 Pneumonitis due to inhalation of food and vomit: Secondary | ICD-10-CM | POA: Diagnosis present

## 2021-05-28 DIAGNOSIS — I509 Heart failure, unspecified: Secondary | ICD-10-CM | POA: Diagnosis not present

## 2021-05-28 DIAGNOSIS — M160 Bilateral primary osteoarthritis of hip: Secondary | ICD-10-CM | POA: Diagnosis not present

## 2021-05-28 DIAGNOSIS — D75838 Other thrombocytosis: Secondary | ICD-10-CM | POA: Diagnosis present

## 2021-05-28 DIAGNOSIS — A4189 Other specified sepsis: Secondary | ICD-10-CM | POA: Diagnosis present

## 2021-05-28 DIAGNOSIS — D751 Secondary polycythemia: Secondary | ICD-10-CM | POA: Diagnosis present

## 2021-05-28 DIAGNOSIS — S0990XA Unspecified injury of head, initial encounter: Secondary | ICD-10-CM | POA: Diagnosis not present

## 2021-05-28 DIAGNOSIS — R6520 Severe sepsis without septic shock: Secondary | ICD-10-CM | POA: Diagnosis not present

## 2021-05-28 DIAGNOSIS — S2241XA Multiple fractures of ribs, right side, initial encounter for closed fracture: Secondary | ICD-10-CM | POA: Diagnosis not present

## 2021-05-28 DIAGNOSIS — M6282 Rhabdomyolysis: Secondary | ICD-10-CM | POA: Diagnosis not present

## 2021-05-28 DIAGNOSIS — I248 Other forms of acute ischemic heart disease: Secondary | ICD-10-CM | POA: Diagnosis present

## 2021-05-28 DIAGNOSIS — N179 Acute kidney failure, unspecified: Secondary | ICD-10-CM

## 2021-05-28 DIAGNOSIS — R1310 Dysphagia, unspecified: Secondary | ICD-10-CM | POA: Diagnosis present

## 2021-05-28 DIAGNOSIS — M25561 Pain in right knee: Secondary | ICD-10-CM | POA: Diagnosis not present

## 2021-05-28 DIAGNOSIS — K5790 Diverticulosis of intestine, part unspecified, without perforation or abscess without bleeding: Secondary | ICD-10-CM | POA: Diagnosis not present

## 2021-05-28 DIAGNOSIS — J3489 Other specified disorders of nose and nasal sinuses: Secondary | ICD-10-CM | POA: Diagnosis not present

## 2021-05-28 DIAGNOSIS — I110 Hypertensive heart disease with heart failure: Secondary | ICD-10-CM | POA: Diagnosis present

## 2021-05-28 DIAGNOSIS — N281 Cyst of kidney, acquired: Secondary | ICD-10-CM | POA: Diagnosis not present

## 2021-05-28 DIAGNOSIS — M2550 Pain in unspecified joint: Secondary | ICD-10-CM | POA: Diagnosis not present

## 2021-05-28 DIAGNOSIS — R55 Syncope and collapse: Secondary | ICD-10-CM | POA: Diagnosis not present

## 2021-05-28 DIAGNOSIS — I5022 Chronic systolic (congestive) heart failure: Secondary | ICD-10-CM | POA: Diagnosis present

## 2021-05-28 DIAGNOSIS — I700 Atherosclerosis of aorta: Secondary | ICD-10-CM | POA: Diagnosis not present

## 2021-05-28 DIAGNOSIS — E871 Hypo-osmolality and hyponatremia: Secondary | ICD-10-CM | POA: Diagnosis not present

## 2021-05-28 DIAGNOSIS — Y92019 Unspecified place in single-family (private) house as the place of occurrence of the external cause: Secondary | ICD-10-CM | POA: Diagnosis not present

## 2021-05-28 DIAGNOSIS — E860 Dehydration: Secondary | ICD-10-CM | POA: Diagnosis present

## 2021-05-28 LAB — COMPREHENSIVE METABOLIC PANEL WITH GFR
ALT: 81 U/L — ABNORMAL HIGH (ref 0–44)
AST: 162 U/L — ABNORMAL HIGH (ref 15–41)
Albumin: 2.8 g/dL — ABNORMAL LOW (ref 3.5–5.0)
Alkaline Phosphatase: 52 U/L (ref 38–126)
Anion gap: 12 (ref 5–15)
BUN: 28 mg/dL — ABNORMAL HIGH (ref 8–23)
CO2: 23 mmol/L (ref 22–32)
Calcium: 7.9 mg/dL — ABNORMAL LOW (ref 8.9–10.3)
Chloride: 89 mmol/L — ABNORMAL LOW (ref 98–111)
Creatinine, Ser: 1.11 mg/dL (ref 0.61–1.24)
GFR, Estimated: >60 mL/min
Glucose, Bld: 101 mg/dL — ABNORMAL HIGH (ref 70–99)
Potassium: 3.6 mmol/L (ref 3.5–5.1)
Sodium: 124 mmol/L — ABNORMAL LOW (ref 135–145)
Total Bilirubin: 1 mg/dL (ref 0.3–1.2)
Total Protein: 5.4 g/dL — ABNORMAL LOW (ref 6.5–8.1)

## 2021-05-28 LAB — RESP PANEL BY RT-PCR (FLU A&B, COVID) ARPGX2
Influenza A by PCR: NEGATIVE
Influenza B by PCR: NEGATIVE
SARS Coronavirus 2 by RT PCR: POSITIVE — AB

## 2021-05-28 LAB — URINALYSIS, ROUTINE W REFLEX MICROSCOPIC
Bilirubin Urine: NEGATIVE
Glucose, UA: 150 mg/dL — AB
Ketones, ur: 20 mg/dL — AB
Leukocytes,Ua: NEGATIVE
Nitrite: NEGATIVE
Protein, ur: 100 mg/dL — AB
Specific Gravity, Urine: 1.027 (ref 1.005–1.030)
pH: 6 (ref 5.0–8.0)

## 2021-05-28 LAB — ETHANOL: Alcohol, Ethyl (B): <10 mg/dL (ref <10)

## 2021-05-28 LAB — COMPREHENSIVE METABOLIC PANEL
ALT: 103 U/L — ABNORMAL HIGH (ref 0–44)
AST: 209 U/L — ABNORMAL HIGH (ref 15–41)
Albumin: 3.6 g/dL (ref 3.5–5.0)
Alkaline Phosphatase: 64 U/L (ref 38–126)
Anion gap: 15 (ref 5–15)
BUN: 25 mg/dL — ABNORMAL HIGH (ref 8–23)
CO2: 26 mmol/L (ref 22–32)
Calcium: 9 mg/dL (ref 8.9–10.3)
Chloride: 84 mmol/L — ABNORMAL LOW (ref 98–111)
Creatinine, Ser: 1.23 mg/dL (ref 0.61–1.24)
GFR, Estimated: >60 mL/min (ref >60)
Glucose, Bld: 143 mg/dL — ABNORMAL HIGH (ref 70–99)
Potassium: 3.5 mmol/L (ref 3.5–5.1)
Sodium: 125 mmol/L — ABNORMAL LOW (ref 135–145)
Total Bilirubin: 1.5 mg/dL — ABNORMAL HIGH (ref 0.3–1.2)
Total Protein: 7 g/dL (ref 6.5–8.1)

## 2021-05-28 LAB — I-STAT VENOUS BLOOD GAS, ED
Acid-Base Excess: 5 mmol/L — ABNORMAL HIGH (ref 0.0–2.0)
Bicarbonate: 30 mmol/L — ABNORMAL HIGH (ref 20.0–28.0)
Calcium, Ion: 0.98 mmol/L — ABNORMAL LOW (ref 1.15–1.40)
HCT: 40 % (ref 39.0–52.0)
Hemoglobin: 13.6 g/dL (ref 13.0–17.0)
O2 Saturation: 63 %
Potassium: 4.1 mmol/L (ref 3.5–5.1)
Sodium: 125 mmol/L — ABNORMAL LOW (ref 135–145)
TCO2: 31 mmol/L (ref 22–32)
pCO2, Ven: 45.1 mmHg (ref 44.0–60.0)
pH, Ven: 7.431 — ABNORMAL HIGH (ref 7.250–7.430)
pO2, Ven: 32 mmHg (ref 32.0–45.0)

## 2021-05-28 LAB — MAGNESIUM
Magnesium: 2 mg/dL (ref 1.7–2.4)
Magnesium: 2 mg/dL (ref 1.7–2.4)

## 2021-05-28 LAB — D-DIMER, QUANTITATIVE
D-Dimer, Quant: 0.87 ug{FEU}/mL — ABNORMAL HIGH (ref 0.00–0.50)
D-Dimer, Quant: 0.92 ug/mL-FEU — ABNORMAL HIGH (ref 0.00–0.50)

## 2021-05-28 LAB — CBC
HCT: 41.2 % (ref 39.0–52.0)
HCT: 37.9 % — ABNORMAL LOW (ref 39.0–52.0)
Hemoglobin: 15 g/dL (ref 13.0–17.0)
Hemoglobin: 13.7 g/dL (ref 13.0–17.0)
MCH: 32.3 pg (ref 26.0–34.0)
MCH: 32.4 pg (ref 26.0–34.0)
MCHC: 36.4 g/dL — ABNORMAL HIGH (ref 30.0–36.0)
MCHC: 36.1 g/dL — ABNORMAL HIGH (ref 30.0–36.0)
MCV: 88.6 fL (ref 80.0–100.0)
MCV: 89.6 fL (ref 80.0–100.0)
Platelets: 439 10*3/uL — ABNORMAL HIGH (ref 150–400)
Platelets: 380 10*3/uL (ref 150–400)
RBC: 4.65 MIL/uL (ref 4.22–5.81)
RBC: 4.23 MIL/uL (ref 4.22–5.81)
RDW: 12.6 % (ref 11.5–15.5)
RDW: 12.7 % (ref 11.5–15.5)
WBC: 24.2 10*3/uL — ABNORMAL HIGH (ref 4.0–10.5)
WBC: 19.4 10*3/uL — ABNORMAL HIGH (ref 4.0–10.5)
nRBC: 0 % (ref 0.0–0.2)
nRBC: 0 % (ref 0.0–0.2)

## 2021-05-28 LAB — PROCALCITONIN: Procalcitonin: 9.04 ng/mL

## 2021-05-28 LAB — TROPONIN I (HIGH SENSITIVITY)
Troponin I (High Sensitivity): 107 ng/L (ref <18)
Troponin I (High Sensitivity): 228 ng/L (ref <18)
Troponin I (High Sensitivity): 88 ng/L — ABNORMAL HIGH (ref <18)

## 2021-05-28 LAB — LACTIC ACID, PLASMA: Lactic Acid, Venous: 2.6 mmol/L (ref 0.5–1.9)

## 2021-05-28 LAB — CK
Total CK: 5600 U/L — ABNORMAL HIGH (ref 49–397)
Total CK: 4016 U/L — ABNORMAL HIGH (ref 49–397)

## 2021-05-28 LAB — TSH: TSH: 2.252 u[IU]/mL (ref 0.350–4.500)

## 2021-05-28 LAB — BASIC METABOLIC PANEL WITH GFR
Anion gap: 13 (ref 5–15)
BUN: 29 mg/dL — ABNORMAL HIGH (ref 8–23)
CO2: 25 mmol/L (ref 22–32)
Calcium: 7.6 mg/dL — ABNORMAL LOW (ref 8.9–10.3)
Chloride: 88 mmol/L — ABNORMAL LOW (ref 98–111)
Creatinine, Ser: 1.12 mg/dL (ref 0.61–1.24)
GFR, Estimated: >60 mL/min
Glucose, Bld: 75 mg/dL (ref 70–99)
Potassium: 4 mmol/L (ref 3.5–5.1)
Sodium: 126 mmol/L — ABNORMAL LOW (ref 135–145)

## 2021-05-28 LAB — LACTATE DEHYDROGENASE: LDH: 354 U/L — ABNORMAL HIGH (ref 98–192)

## 2021-05-28 LAB — CREATININE, URINE, RANDOM: Creatinine, Urine: 83.71 mg/dL

## 2021-05-28 LAB — OSMOLALITY, URINE: Osmolality, Ur: 500 mOsm/kg (ref 300–900)

## 2021-05-28 LAB — URIC ACID: Uric Acid, Serum: 6.2 mg/dL (ref 3.7–8.6)

## 2021-05-28 LAB — HIV ANTIBODY (ROUTINE TESTING W REFLEX): HIV Screen 4th Generation wRfx: NONREACTIVE

## 2021-05-28 LAB — C-REACTIVE PROTEIN: CRP: 6.4 mg/dL — ABNORMAL HIGH

## 2021-05-28 LAB — FIBRINOGEN: Fibrinogen: 550 mg/dL — ABNORMAL HIGH (ref 210–475)

## 2021-05-28 LAB — RAPID URINE DRUG SCREEN, HOSP PERFORMED
Amphetamines: NOT DETECTED
Barbiturates: NOT DETECTED
Benzodiazepines: NOT DETECTED
Cocaine: NOT DETECTED
Opiates: NOT DETECTED
Tetrahydrocannabinol: NOT DETECTED

## 2021-05-28 LAB — OSMOLALITY
Osmolality: 275 mOsm/kg (ref 275–295)
Osmolality: 275 mOsm/kg (ref 275–295)

## 2021-05-28 LAB — SODIUM, URINE, RANDOM: Sodium, Ur: 53 mmol/L

## 2021-05-28 LAB — CORTISOL: Cortisol, Plasma: 57.2 ug/dL

## 2021-05-28 LAB — FERRITIN: Ferritin: 562 ng/mL — ABNORMAL HIGH (ref 24–336)

## 2021-05-28 LAB — APTT: aPTT: 27 s (ref 24–36)

## 2021-05-28 MED ORDER — ASPIRIN 81 MG PO CHEW
81.0000 mg | CHEWABLE_TABLET | Freq: Every day | ORAL | Status: DC
  Administered 2021-05-28 – 2021-06-12 (×16): 81 mg via ORAL
  Filled 2021-05-28 (×16): qty 1

## 2021-05-28 MED ORDER — LACTATED RINGERS IV SOLN: INTRAVENOUS | Status: AC

## 2021-05-28 MED ORDER — LORAZEPAM 2 MG/ML IJ SOLN
1.0000 mg | INTRAMUSCULAR | Status: DC | PRN
  Administered 2021-05-28 – 2021-05-29 (×4): 2 mg via INTRAVENOUS
  Administered 2021-05-29: 1 mg via INTRAVENOUS
  Filled 2021-05-28 (×5): qty 1

## 2021-05-28 MED ORDER — METRONIDAZOLE 500 MG/100ML IV SOLN
500.0000 mg | Freq: Three times a day (TID) | INTRAVENOUS | Status: DC
  Administered 2021-05-28: 500 mg via INTRAVENOUS
  Filled 2021-05-28: qty 100

## 2021-05-28 MED ORDER — DEXAMETHASONE SODIUM PHOSPHATE 10 MG/ML IJ SOLN
6.0000 mg | INTRAMUSCULAR | Status: DC
  Administered 2021-05-28 – 2021-06-02 (×6): 6 mg via INTRAVENOUS
  Filled 2021-05-28 (×6): qty 1

## 2021-05-28 MED ORDER — METRONIDAZOLE 500 MG PO TABS
500.0000 mg | ORAL_TABLET | Freq: Three times a day (TID) | ORAL | Status: AC
  Administered 2021-05-28 – 2021-06-01 (×13): 500 mg via ORAL
  Filled 2021-05-28 (×14): qty 1

## 2021-05-28 MED ORDER — IOPAMIDOL (ISOVUE-370) INJECTION 76%
100.0000 mL | Freq: Once | INTRAVENOUS | Status: AC | PRN
  Administered 2021-05-28: 100 mL via INTRAVENOUS

## 2021-05-28 MED ORDER — LORAZEPAM 1 MG PO TABS
1.0000 mg | ORAL_TABLET | ORAL | Status: DC | PRN
  Administered 2021-05-29: 1 mg via ORAL
  Administered 2021-05-30: 2 mg via ORAL
  Filled 2021-05-28: qty 2
  Filled 2021-05-28: qty 1

## 2021-05-28 MED ORDER — LACTATED RINGERS IV SOLN: INTRAVENOUS | Status: DC

## 2021-05-28 MED ORDER — ENOXAPARIN SODIUM 40 MG/0.4ML IJ SOSY
40.0000 mg | PREFILLED_SYRINGE | INTRAMUSCULAR | Status: DC
  Administered 2021-05-28 – 2021-06-11 (×15): 40 mg via SUBCUTANEOUS
  Filled 2021-05-28 (×15): qty 0.4

## 2021-05-28 MED ORDER — SODIUM CHLORIDE 0.9 % IV SOLN
100.0000 mg | Freq: Every day | INTRAVENOUS | Status: AC
  Administered 2021-05-29 – 2021-06-01 (×4): 100 mg via INTRAVENOUS
  Filled 2021-05-28 (×2): qty 20
  Filled 2021-05-28: qty 100
  Filled 2021-05-28: qty 20

## 2021-05-28 MED ORDER — FOLIC ACID 1 MG PO TABS
1.0000 mg | ORAL_TABLET | Freq: Every day | ORAL | Status: DC
  Administered 2021-05-28 – 2021-06-12 (×16): 1 mg via ORAL
  Filled 2021-05-28 (×16): qty 1

## 2021-05-28 MED ORDER — CARVEDILOL 3.125 MG PO TABS
3.1250 mg | ORAL_TABLET | Freq: Two times a day (BID) | ORAL | Status: DC
  Administered 2021-05-28 – 2021-06-05 (×14): 3.125 mg via ORAL
  Filled 2021-05-28 (×16): qty 1

## 2021-05-28 MED ORDER — ADULT MULTIVITAMIN W/MINERALS CH
1.0000 | ORAL_TABLET | Freq: Every day | ORAL | Status: DC
  Administered 2021-05-28 – 2021-06-12 (×16): 1 via ORAL
  Filled 2021-05-28 (×16): qty 1

## 2021-05-28 MED ORDER — THIAMINE HCL 100 MG/ML IJ SOLN
100.0000 mg | Freq: Every day | INTRAMUSCULAR | Status: DC
  Filled 2021-05-28: qty 2

## 2021-05-28 MED ORDER — STERILE WATER FOR INJECTION IV SOLN
INTRAVENOUS | Status: DC
  Filled 2021-05-28: qty 150

## 2021-05-28 MED ORDER — SODIUM CHLORIDE 0.9 % IV SOLN
200.0000 mg | Freq: Once | INTRAVENOUS | Status: AC
  Administered 2021-05-28: 200 mg via INTRAVENOUS
  Filled 2021-05-28: qty 40

## 2021-05-28 MED ORDER — SODIUM CHLORIDE 0.9 % IV SOLN
2.0000 g | Freq: Three times a day (TID) | INTRAVENOUS | Status: DC
  Administered 2021-05-28: 2 g via INTRAVENOUS
  Filled 2021-05-28: qty 2

## 2021-05-28 MED ORDER — LACTATED RINGERS IV BOLUS (SEPSIS)
1000.0000 mL | Freq: Once | INTRAVENOUS | Status: AC
  Administered 2021-05-28: 1000 mL via INTRAVENOUS

## 2021-05-28 MED ORDER — AMLODIPINE BESYLATE 10 MG PO TABS
10.0000 mg | ORAL_TABLET | Freq: Every day | ORAL | Status: DC
  Administered 2021-05-28 – 2021-06-01 (×5): 10 mg via ORAL
  Filled 2021-05-28 (×4): qty 1
  Filled 2021-05-28: qty 2

## 2021-05-28 MED ORDER — ROSUVASTATIN CALCIUM 5 MG PO TABS
10.0000 mg | ORAL_TABLET | Freq: Every day | ORAL | Status: DC
  Administered 2021-05-28 – 2021-06-12 (×16): 10 mg via ORAL
  Filled 2021-05-28 (×16): qty 2

## 2021-05-28 MED ORDER — VANCOMYCIN HCL 2000 MG/400ML IV SOLN: 2000.0000 mg | INTRAVENOUS | Status: DC

## 2021-05-28 MED ORDER — LEVOFLOXACIN 750 MG PO TABS
750.0000 mg | ORAL_TABLET | Freq: Every day | ORAL | Status: AC
  Administered 2021-05-28 – 2021-05-31 (×4): 750 mg via ORAL
  Filled 2021-05-28 (×5): qty 1

## 2021-05-28 MED ORDER — THIAMINE HCL 100 MG PO TABS
100.0000 mg | ORAL_TABLET | Freq: Every day | ORAL | Status: DC
  Administered 2021-05-28 – 2021-06-09 (×13): 100 mg via ORAL
  Filled 2021-05-28 (×14): qty 1

## 2021-05-28 MED ORDER — LOPERAMIDE HCL 2 MG PO CAPS: 2.0000 mg | ORAL_CAPSULE | Freq: Four times a day (QID) | ORAL | Status: DC | PRN

## 2021-05-28 NOTE — Progress Notes (Signed)
PROGRESS NOTE                                                                                                                                                                                                             Patient Demographics:    Fred Graham, is a 71 y.o. male, DOB - [birth year], NOB:[id number]  Outpatient Primary MD for the patient is [name], NP    LOS - 0  Admit date - 05/27/2021    Chief Complaint  Patient presents with   Altered Mental Status       Brief Narrative (HPI from H&P)  Fred Graham is a 70 y.o. male with medical history significant of hypertension, alcohol abuse, depression presented to the ED via EMS after being found down at home at the time of evaluation by law enforcement.  Per EMS, family had not heard from the patient in several days and called for welfare check.  Police found the patient down on the floor in his underwear, altered, very cold to touch, and did not have heating on at home, was brought to the hospital with confusion, hypothermia, sepsis due to pneumonia and COVID infection.  He was admitted for further care.   Subjective:    Fred Graham today has, No headache, No chest pain, No abdominal pain - No Nausea, No new weakness tingling or numbness, no SOB.   Assessment  & Plan :     Severe sepsis due to possible aspiration pneumonia in the setting of COVID-19 infection in a patient who is vaccinated with 2 shots of COVID-vaccine - he is currently on empiric antibiotics, also on trial of steroids and remdesivir.  Inflammatory markers are borderline.  Speech to evaluate.  Continue IV fluids for hydration.  Sepsis pathophysiology has improved.  Monitor cultures closely .  Encouraged the patient to sit up in chair in the daytime use I-S and flutter valve for pulmonary toiletry.  Will advance activity and titrate down oxygen as possible.  2.  Acute on chronic hyponatremia.   Somewhat chronic.  For now appears dehydrated, will hydrate, check urine electrolytes and serum osmolality.  May require Samsca.  3.  Rhabdomyolysis.  Hydrate.  4.  Metobolic encephalopathy.  Improving with supportive care, no focal deficits head CT stable.  5.  Hypothermia.  Due to combination of sepsis and  exposure to elements.  Improved with supportive care continue.  6.  Deconditioning and fall.  PT OT.  Hydrate.  7.  Hypertension.  Norvasc and Coreg.  8.  Mildly elevated transaminases.  Asymptomatic due to rhabdo.  Trend.  9.  Alcohol abuse.  Counseled to quit.  On CIWA protocol  10.  Mildly elevated troponin.  Likely demand ischemia from #1 above, check echocardiogram, placed on aspirin, statin and beta-blocker for secondary prevention.  No acute issues.  No chest pain.         Condition - Extremely Guarded  Family Communication  : Daughter [name] [phone] on 05/28/2021 at 11:21 AM  Code Status :  Full  Consults  :  None  PUD Prophylaxis : Pepcid   Procedures  :     TTE  CT head and C-spine.  Nonacute.  CTA - Patent three-vessel runoff bilaterally. Additional ancillary findings as above  CT chest -  Nondisplaced right anterolateral 5th and 6th rib fractures. No pneumothorax is seen. Suspected mild aspiration in the right middle lobe and bilateral lung bases. Aortic Atherosclerosis (ICD10-I70.0).       Disposition Plan  :    Status is: Inpatient  Remains inpatient appropriate because: Sepsis due to pneumonia.  DVT Prophylaxis  :    enoxaparin (LOVENOX) injection 40 mg Start: 05/28/21 1600     Lab Results  Component Value Date   PLT 439 (H) 05/28/2021    Diet :  Diet Order             DIET SOFT Room service appropriate? Yes; Fluid consistency: Thin  Diet effective now                    Inpatient Medications  Scheduled Meds:  amLODipine  10 mg Oral Daily   aspirin  81 mg Oral Daily   carvedilol  3.125 mg Oral BID WC   enoxaparin  (LOVENOX) injection  40 mg Subcutaneous Q24H   folic acid  1 mg Oral Daily   levofloxacin  750 mg Oral Daily   metroNIDAZOLE  500 mg Oral Q8H   multivitamin with minerals  1 tablet Oral Daily   rosuvastatin  10 mg Oral Daily   thiamine  100 mg Oral Daily   Or   thiamine  100 mg Intravenous Daily   Continuous Infusions:  lactated ringers     [START ON 05/29/2021] remdesivir 100 mg in NS 100 mL     PRN Meds:.loperamide, LORazepam **OR** LORazepam  Antibiotics  :    Anti-infectives (From admission, onward)    Start     Dose/Rate Route Frequency Ordered Stop   05/29/21 1000  remdesivir 100 mg in sodium chloride 0.9 % 100 mL IVPB       See Hyperspace for full Linked Orders Report.   100 mg 200 mL/hr over 30 Minutes Intravenous Daily 05/28/21 0530 06/02/21 0959   05/29/21 0200  vancomycin (VANCOREADY) IVPB 2000 mg/400 mL  Status:  Discontinued        2,000 mg 200 mL/hr over 120 Minutes Intravenous Every 24 hours 05/28/21 0538 05/28/21 0942   05/28/21 1100  levofloxacin (LEVAQUIN) tablet 750 mg        750 mg Oral Daily 05/28/21 0943 06/01/21 0959   05/28/21 1100  metroNIDAZOLE (FLAGYL) tablet 500 mg        500 mg Oral Every 8 hours 05/28/21 0943     05/28/21 0800  metroNIDAZOLE (FLAGYL) IVPB 500 mg  Status:  Discontinued        500 mg 100 mL/hr over 60 Minutes Intravenous Every 8 hours 05/28/21 0523 05/28/21 0942   05/28/21 0800  ceFEPIme (MAXIPIME) 2 g in sodium chloride 0.9 % 100 mL IVPB  Status:  Discontinued        2 g 200 mL/hr over 30 Minutes Intravenous Every 8 hours 05/28/21 0538 05/28/21 0943   05/28/21 0530  remdesivir 200 mg in sodium chloride 0.9% 250 mL IVPB       See Hyperspace for full Linked Orders Report.   200 mg 580 mL/hr over 30 Minutes Intravenous Once 05/28/21 0530 05/28/21 0754   05/27/21 2330  ceFEPIme (MAXIPIME) 2 g in sodium chloride 0.9 % 100 mL IVPB        2 g 200 mL/hr over 30 Minutes Intravenous  Once 05/27/21 2315 05/28/21 0205   05/27/21 2330   metroNIDAZOLE (FLAGYL) IVPB 500 mg        500 mg 100 mL/hr over 60 Minutes Intravenous  Once 05/27/21 2315 05/28/21 0205   05/27/21 2330  vancomycin (VANCOREADY) IVPB 2000 mg/400 mL        2,000 mg 200 mL/hr over 120 Minutes Intravenous  Once 05/27/21 2315 05/28/21 0422        Time Spent in minutes  30   Susa Raring M.D on 05/28/2021 at 11:12 AM  To page go to [email or web address]   [facility] -  Office  [phone]  See all Orders from today for further details    Objective:   Vitals:   05/28/21 0900 05/28/21 0915 05/28/21 0930 05/28/21 0945  BP: 111/62 125/81 (!) 142/75 (!) 150/76  Pulse: 90 98 90 98  Resp: 14 (!) 26 (!) 23 16  Temp:      TempSrc:      SpO2: 94% 96% 98% 97%  Weight:      Height:        Wt Readings from Last 3 Encounters:  05/28/21 103.6 kg  05/05/19 93 kg  10/20/18 95.3 kg     Intake/Output Summary (Last 24 hours) at 05/28/2021 1112 Last data filed at 05/28/2021 0851 Gross per 24 hour  Intake 2990.08 ml  Output --  Net 2990.08 ml     Physical Exam  Awake Alert, No new F.N deficits, Normal affect [place].AT,PERRAL Supple Neck, No JVD,   Symmetrical Chest wall movement, Good air movement bilaterally, CTAB RRR,No Gallops,Rubs or new Murmurs,  +ve B.Sounds, Abd Soft, No tenderness,   No Cyanosis, Clubbing or edema       Data Review:    CBC Recent Labs  Lab 05/27/21 2222 05/27/21 2249 05/28/21 0230 05/28/21 0537  WBC 25.4*  --   --  24.2*  HGB 18.0* 18.4* 13.6 15.0  HCT 49.3 54.0* 40.0 41.2  PLT 557*  --   --  439*  MCV 87.9  --   --  88.6  MCH 32.1  --   --  32.3  MCHC 36.5*  --   --  36.4*  RDW 12.4  --   --  12.6  LYMPHSABS 1.4  --   --   --   MONOABS 1.1*  --   --   --   EOSABS 0.0  --   --   --   BASOSABS 0.1  --   --   --     Electrolytes Recent Labs  Lab 05/27/21 2222 05/27/21 2223 05/27/21 2249 05/28/21 0207 05/28/21 0230 05/28/21 0537  NA 125*  --  125*  --  125* 124*  K 3.5  --  3.4*  --  4.1 3.6   CL 84*  --   --   --   --  89*  CO2 26  --   --   --   --  23  GLUCOSE 143*  --   --   --   --  101*  BUN 25*  --   --   --   --  28*  CREATININE 1.23  --   --   --   --  1.11  CALCIUM 9.0  --   --   --   --  7.9*  AST 209*  --   --   --   --  162*  ALT 103*  --   --   --   --  81*  ALKPHOS 64  --   --   --   --  52  BILITOT 1.5*  --   --   --   --  1.0  ALBUMIN 3.6  --   --   --   --  2.8*  MG  --   --   --   --   --  2.0  CRP  --   --   --   --   --  6.4*  DDIMER  --   --   --   --   --  0.87*  PROCALCITON  --   --   --   --   --  9.04  LATICACIDVEN  --  4.3*  --  2.6*  --   --   INR 0.9  --   --   --   --   --   TSH  --   --   --   --   --  2.252    ------------------------------------------------------------------------------------------------------------------ No results for input(s): CHOL, HDL, LDLCALC, TRIG, CHOLHDL, LDLDIRECT in the last 72 hours.  No results found for: HGBA1C  Recent Labs    05/28/21 0537  TSH 2.252   ------------------------------------------------------------------------------------------------------------------ ID Labs Recent Labs  Lab 05/27/21 2222 05/27/21 2223 05/28/21 0207 05/28/21 0537  WBC 25.4*  --   --  24.2*  PLT 557*  --   --  439*  CRP  --   --   --  6.4*  DDIMER  --   --   --  0.87*  PROCALCITON  --   --   --  9.04  LATICACIDVEN  --  4.3* 2.6*  --   CREATININE 1.23  --   --  1.11   Cardiac Enzymes No results for input(s): CKMB, TROPONINI, MYOGLOBIN in the last 168 hours.  Invalid input(s): CK      Radiology Reports CT HEAD WO CONTRAST  Result Date: 05/28/2021 CLINICAL DATA:  Trauma. EXAM: CT HEAD WITHOUT CONTRAST CT CERVICAL SPINE WITHOUT CONTRAST TECHNIQUE: Multidetector CT imaging of the head and cervical spine was performed following the standard protocol without intravenous contrast. Multiplanar CT image reconstructions of the cervical spine were also generated. COMPARISON:  None. FINDINGS: CT HEAD FINDINGS  Brain: Mild age-related atrophy and chronic microvascular ischemic changes. No acute intracranial hemorrhage. No mass effect or midline shift. No extra-axial fluid collection. Vascular: No hyperdense vessel or unexpected calcification. Skull: Normal. Negative for fracture or focal lesion. Sinuses/Orbits: Diffuse mucoperiosteal thickening of paranasal sinuses with partial opacification of several ethmoid air cells and left maxillary sinus. There is air-fluid level in the left maxillary  sinus. The mastoid air cells are clear. Other: None CT CERVICAL SPINE FINDINGS Alignment: No acute subluxation. Skull base and vertebrae: No acute fracture. Soft tissues and spinal canal: No prevertebral fluid or swelling. No visible canal hematoma. Disc levels: Multilevel degenerative changes with multilevel facet arthropathy. Upper chest: Probable atelectasis. Other: None IMPRESSION: 1. No acute intracranial pathology. Mild age-related atrophy and chronic microvascular ischemic changes. 2. No acute/traumatic cervical spine pathology. Multilevel degenerative changes. 3. Paranasal sinus disease. Electronically Signed   By: Elgie Collard M.D.   On: 05/28/2021 02:10   CT Chest W Contrast  Result Date: 05/28/2021 CLINICAL DATA:  Altered mental status, found down, hypothermic EXAM: CT CHEST WITH CONTRAST TECHNIQUE: Multidetector CT imaging of the chest was performed during intravenous contrast administration. CONTRAST:  ISOVUE-370 IOPAMIDOL (ISOVUE-370) INJECTION 76% COMPARISON:  Chest radiograph dated 05/27/2021 FINDINGS: Cardiovascular: Heart is normal in size.  No pericardial effusion. No evidence of thoracic aortic aneurysm. Mild atherosclerotic calcifications of the aortic arch. Three vessel coronary atherosclerosis. Mediastinum/Nodes: No suspicious mediastinal lymphadenopathy. Visualized thyroid is unremarkable. Lungs/Pleura: Mild dependent atelectasis in the bilateral upper and lower lobes. Superimposed mild  ground-glass opacity in the inferior right middle lobe (series 8/image 103) and bilateral lung bases could reflect mild aspiration, particularly given associated mild endobronchial debris in the right lower lobe bronchi (series 7/image 111). No suspicious pulmonary nodules. No pleural effusion or pneumothorax. Upper Abdomen: Evaluated on dedicated CTA. Musculoskeletal: Nondisplaced right anterolateral 5th and 6th rib fractures (series 7/images 99 and 125). Thoracic spine is within normal limits. IMPRESSION: Nondisplaced right anterolateral 5th and 6th rib fractures. No pneumothorax is seen. Suspected mild aspiration in the right middle lobe and bilateral lung bases. Aortic Atherosclerosis (ICD10-I70.0). Electronically Signed   By: Charline Bills M.D.   On: 05/28/2021 02:17   CT CERVICAL SPINE WO CONTRAST  Result Date: 05/28/2021 CLINICAL DATA:  Trauma. EXAM: CT HEAD WITHOUT CONTRAST CT CERVICAL SPINE WITHOUT CONTRAST TECHNIQUE: Multidetector CT imaging of the head and cervical spine was performed following the standard protocol without intravenous contrast. Multiplanar CT image reconstructions of the cervical spine were also generated. COMPARISON:  None. FINDINGS: CT HEAD FINDINGS Brain: Mild age-related atrophy and chronic microvascular ischemic changes. No acute intracranial hemorrhage. No mass effect or midline shift. No extra-axial fluid collection. Vascular: No hyperdense vessel or unexpected calcification. Skull: Normal. Negative for fracture or focal lesion. Sinuses/Orbits: Diffuse mucoperiosteal thickening of paranasal sinuses with partial opacification of several ethmoid air cells and left maxillary sinus. There is air-fluid level in the left maxillary sinus. The mastoid air cells are clear. Other: None CT CERVICAL SPINE FINDINGS Alignment: No acute subluxation. Skull base and vertebrae: No acute fracture. Soft tissues and spinal canal: No prevertebral fluid or swelling. No visible canal hematoma.  Disc levels: Multilevel degenerative changes with multilevel facet arthropathy. Upper chest: Probable atelectasis. Other: None IMPRESSION: 1. No acute intracranial pathology. Mild age-related atrophy and chronic microvascular ischemic changes. 2. No acute/traumatic cervical spine pathology. Multilevel degenerative changes. 3. Paranasal sinus disease. Electronically Signed   By: Elgie Collard M.D.   On: 05/28/2021 02:10   CT Angio Aortobifemoral W and/or Wo Contrast  Result Date: 05/28/2021 CLINICAL DATA:  Found down, cyanotic feet EXAM: CT ANGIOGRAPHY OF ABDOMINAL AORTA WITH ILIOFEMORAL RUNOFF TECHNIQUE: Multidetector CT imaging of the abdomen, pelvis and lower extremities was performed using the standard protocol during bolus administration of intravenous contrast. Multiplanar CT image reconstructions and MIPs were obtained to evaluate the vascular anatomy. CONTRAST:  ISOVUE-370 IOPAMIDOL (ISOVUE-370)  INJECTION 76% COMPARISON:  None. FINDINGS: VASCULAR Aorta: Patent.  Mild atherosclerotic calcifications. Celiac: Patent.  Atherosclerotic calcifications at the origin. SMA: Patent. Renals: Patent bilaterally. Atherosclerotic calcifications of the origin on the left. IMA: Patent. RIGHT Lower Extremity Inflow: Patent. Outflow: Patent. Runoff: Patent three-vessel runoff. LEFT Lower Extremity Inflow: Patent. Outflow: Patent. Runoff: Patent three-vessel runoff. Veins: Unremarkable. Review of the MIP images confirms the above findings. NON-VASCULAR Lower chest: Evaluated on dedicated CT chest. Hepatobiliary: Liver is within normal limits. Gallbladder is unremarkable. No intrahepatic or extrahepatic duct dilatation. Pancreas: Within normal limits. Spleen: Within normal limits. Adrenals/Urinary Tract: Mild nodular thickening of the left adrenal gland without discrete mass. Bilateral renal cysts, measuring up to 4.2 cm in the left upper kidney (series 7/image 80). No hydronephrosis. Bladder is within normal  limits. Stomach/Bowel: Stomach is within normal limits. No evidence of bowel obstruction. Appendix is not discretely visualized. Sigmoid diverticulosis, without evidence of diverticulitis. Lymphatic: No suspicious abdominopelvic lymphadenopathy. Reproductive: Prostate is unremarkable. Other: No abdominopelvic ascites. Musculoskeletal: Mild degenerative changes of the visualized thoracolumbar spine. IMPRESSION: Patent three-vessel runoff bilaterally. Additional ancillary findings as above. Electronically Signed   By: Charline Bills M.D.   On: 05/28/2021 02:12   DG Chest Port 1 View  Result Date: 05/28/2021 CLINICAL DATA:  70 year old male found down.  Positive COVID-19. EXAM: PORTABLE CHEST 1 VIEW COMPARISON:  CT chest 0102 hours today and earlier. FINDINGS: Portable AP upright view at 0757 hours. Mildly lower lung volumes compared to yesterday. Normal cardiac size and mediastinal contours. No pneumothorax or pleural effusion. Allowing for portable technique the lungs are clear. Visualized tracheal air column is within normal limits. Right 5th and 6th rib fractures better demonstrated by CT. No new osseous abnormality identified. Negative visible bowel gas. IMPRESSION: 1. No new cardiopulmonary abnormality. 2. Right 5th and 6th rib fractures better demonstrated by CT today. Electronically Signed   By: Odessa Fleming M.D.   On: 05/28/2021 08:09   DG Chest Port 1 View  Result Date: 05/27/2021 CLINICAL DATA:  Altered mental status, hypothermia EXAM: PORTABLE CHEST 1 VIEW COMPARISON:  None. FINDINGS: Heart and mediastinal contours are within normal limits. No focal opacities or effusions. No acute bony abnormality. IMPRESSION: No active cardiopulmonary disease. Electronically Signed   By: Charlett Nose M.D.   On: 05/27/2021 22:41

## 2021-05-28 NOTE — ED Notes (Signed)
The daughter Lezlie Octave) is the only visitor allowed. Password placed on chart.

## 2021-05-28 NOTE — Evaluation (Signed)
Physical Therapy Evaluation Patient Details Name: Fred Graham MRN: [id number] DOB: 1951/05/23 Today's Date: 05/28/2021  History of Present Illness  70 y.o. male with medical history significant of hypertension, alcohol abuse, depression presented to the ED 05/27/21 via EMS after being found down at home at the time of evaluation by law enforcement.  Per EMS, family had not heard from the patient in several days and called for welfare check.Admitted with aspiration pna and COVID 19. ETOH. PMH: hypertension, alcohol abuse, depression  Clinical Impression  PTA pt living in mobile home with one step to enter. Pt reports independence with ambulation and ADLs. Pt currently limited in safe mobility by decreased cognition, and bilateral hip pain in presence of decreased strength and balance. Pt is total A for bed mobility and unable to transfer to standing. PT recommending SNF level rehab at discharge. PT will continue to follow acutely.        Recommendations for follow up therapy are one component of a multi-disciplinary discharge planning process, led by the attending physician.  Recommendations may be updated based on patient status, additional functional criteria and insurance authorization.  Follow Up Recommendations Skilled nursing-short term rehab (<3 hours/day)    Assistance Recommended at Discharge Frequent or constant Supervision/Assistance  Functional Status Assessment Patient has had a recent decline in their functional status and demonstrates the ability to make significant improvements in function in a reasonable and predictable amount of time.  Equipment Recommendations   (TBD)    Recommendations for Other Services OT consult     Precautions / Restrictions Precautions Precautions: Fall Precaution Comments: found down at home Restrictions Weight Bearing Restrictions: No      Mobility  Bed Mobility Overal bed mobility: Needs Assistance Bed Mobility: Supine to Sit;Sit to  Supine     Supine to sit: Mod assist Sit to supine: Total assist   General bed mobility comments: modA for managing LE off bed and bringing trunk to upright. total A to bring LE back into bed    Transfers Overall transfer level: Needs assistance Equipment used: Rolling walker (2 wheels) Transfers: Sit to/from Stand Sit to Stand: Total assist           General transfer comment: unable to lift hips off bed, pt hanging over RW despite cuing           Balance Overall balance assessment: Needs assistance Sitting-balance support: Feet supported;Bilateral upper extremity supported Sitting balance-Leahy Scale: Poor Sitting balance - Comments: requires bilateral UE support otherwise leans on bed to L                                     Pertinent Vitals/Pain Pain Assessment: Faces Faces Pain Scale: Hurts whole lot Pain Location: bilateral hips R >L especially with movement Pain Descriptors / Indicators: Grimacing;Guarding;Moaning Pain Intervention(s): Limited activity within patient's tolerance;Monitored during session;Repositioned    Home Living Family/patient expects to be discharged to:: Private residence Living Arrangements: Alone   Type of Home: Mobile home Home Access: Stairs to enter   Secretary/administrator of Steps: 1   Home Layout: One level Home Equipment: BSC/3in1      Prior Function Prior Level of Function : Patient poor historian/Family not available             Mobility Comments: reports independence with mobility ADLs Comments: reports independence with ADLs        Extremity/Trunk Assessment  Upper Extremity Assessment Upper Extremity Assessment: Defer to OT evaluation    Lower Extremity Assessment Lower Extremity Assessment: RLE deficits/detail;LLE deficits/detail RLE Deficits / Details: R hip AAROM painful and limited, knee and ankle ROM WFL, strength at knee 2/5, ankle 3/5 RLE: Unable to fully assess due to pain RLE  Coordination: decreased fine motor LLE Deficits / Details: AAROM WFL, strength grossly 2/5 LLE Coordination: WNL    Cervical / Trunk Assessment Cervical / Trunk Assessment: Kyphotic  Communication   Communication: No difficulties  Cognition Arousal/Alertness: Awake/alert Behavior During Therapy: WFL for tasks assessed/performed Overall Cognitive Status: No family/caregiver present to determine baseline cognitive functioning                                 General Comments: A&Ox4, requires increased cuing and has decreased memory of commands after a couple of seconds, needs constant cuing        General Comments General comments (skin integrity, edema, etc.): carpet Marrazzo on elbows and kness from long time on floor, VSS on RA        Assessment/Plan    PT Assessment Patient needs continued PT services  PT Problem List Decreased strength;Decreased range of motion;Decreased activity tolerance;Decreased balance;Decreased mobility;Decreased coordination;Decreased cognition;Decreased safety awareness;Decreased knowledge of use of DME;Decreased skin integrity;Pain       PT Treatment Interventions DME instruction;Gait training;Stair training;Functional mobility training;Therapeutic activities;Therapeutic exercise;Balance training;Cognitive remediation;Patient/family education    PT Goals (Current goals can be found in the Care Plan section)  Acute Rehab PT Goals Patient Stated Goal: go home to cat PT Goal Formulation: With patient Time For Goal Achievement: 06/11/21 Potential to Achieve Goals: Fair    Frequency Min 2X/week   Barriers to discharge Decreased caregiver support         AM-PAC PT "6 Clicks" Mobility  Outcome Measure Help needed turning from your back to your side while in a flat bed without using bedrails?: A Little Help needed moving from lying on your back to sitting on the side of a flat bed without using bedrails?: Total Help needed moving to  and from a bed to a chair (including a wheelchair)?: Total Help needed standing up from a chair using your arms (e.g., wheelchair or bedside chair)?: Total Help needed to walk in hospital room?: Total Help needed climbing 3-5 steps with a railing? : Total 6 Click Score: 8    End of Session Equipment Utilized During Treatment: Gait belt Activity Tolerance: Patient limited by fatigue;Patient limited by pain Patient left: in bed;with call bell/phone within reach Nurse Communication: Mobility status PT Visit Diagnosis: Other abnormalities of gait and mobility (R26.89);Repeated falls (R29.6);Muscle weakness (generalized) (M62.81);History of falling (Z91.81);Difficulty in walking, not elsewhere classified (R26.2);Pain Pain - Right/Left:  (bilateral hips R>L) Pain - part of body: Hip    Time: 5945-8592 PT Time Calculation (min) (ACUTE ONLY): 32 min   Charges:   PT Evaluation $PT Eval Moderate Complexity: 1 Mod PT Treatments $Therapeutic Activity: 8-22 mins        Maysen Bonsignore B. Beverely Risen PT, DPT Acute Rehabilitation Services Pager [phone] Office [phone]   Elon Alas Fleet 05/28/2021, 4:28 PM

## 2021-05-28 NOTE — H&P (Signed)
History and Physical    Fred Graham:[id number] DOB: [birth year] DOA: 05/27/2021  PCP: Maximiano Coss, NP Patient coming from: Home  Chief Complaint: Altered mental status  HPI: Fred Graham is a 70 y.o. male with medical history significant of hypertension, alcohol abuse, depression presented to the ED via EMS after being found down at home at the time of evaluation by law enforcement.  Per EMS, family had not heard from the patient in several days and called for welfare check.  Police found the patient down on the floor in his underwear, altered, very cold to touch, and did not have heating on at home.  Extremities very cold to touch with EMS and was shivering.  He had bruising on his arms, face, abdomen, chest, and bilateral knees.  His feet looked purple upon EMS arrival.  Family reported heavy alcohol use.  In the ED, hypotensive with systolic in the 29B.  Hypothermic with rectal temperature 95.7 F.  Slightly tachycardic.  Not hypoxic.  Labs showing WBC 28.4 with neutrophilic predominance.  Hemoglobin 18.0.  Platelet count 557k.  Sodium 125.  Creatinine 1.2 (baseline 0.7-0.9).  AST 209, ALT 103.  T bili 1.5, alk phos normal.  UDS pending.  INR 0.9.  CK 5600.  High-sensitivity troponin 88 >107.  EKG not suggestive of ACS.  Lactic acid 4.3 >2.6.  Blood ethanol level pending.  Bicarb normal.  VBG with normal pH.  Blood culture x2 drawn.  SARS-CoV-2 PCR test positive.  UA not suggestive of infection (negative nitrite, negative leukocytes, 6-10 WBCs, rare bacteria).  Chest x-ray not suggestive of pneumonia.  CT head and C-spine negative for acute finding.  CT chest showing nondisplaced right anterolateral fifth and sixth rib fractures without evidence of pneumothorax.  Also showing mild aspiration in the right middle lobe and bilateral lung bases.  Patient's feet were initially cold to touch and no pulses appreciated.  After his feet became warm, pulses were present. CTA showing patent three-vessel  runoff bilaterally.  Patient was given vancomycin, cefepime, metronidazole, 30 cc/Kg fluid boluses, and started on bicarb infusion.  Patient is very confused and not able to give any history.  He does not know where he is.  He has no complaints.  Denies cough, shortness of breath, chest pain, abdominal pain, nausea, vomiting, or dysuria.  Review of Systems:  All systems reviewed and apart from history of presenting illness, are negative.  Past Medical History:  Diagnosis Date   Allergy    Arthritis    hands, hips   Hypertension     History reviewed. No pertinent surgical history.   reports that he quit smoking about 27 years ago. His smoking use included cigarettes. He has a 5.00 pack-year smoking history. He has never used smokeless tobacco. He reports current alcohol use of about 10.0 standard drinks per week. He reports that he does not use drugs.  Allergies  Allergen Reactions   Penicillins     Rash     Family History  Problem Relation Age of Onset   Cancer Brother        skin cancer   Heart disease Brother 13       cardiac stenting   Depression Father    Heart disease Father 69       CABG    Prior to Admission medications   Medication Sig Start Date End Date Taking? Authorizing Provider  loperamide (IMODIUM A-D) 2 MG tablet Take 1 tablet (2 mg total) by mouth 4 (  four) times daily as needed for diarrhea or loose stools. 10/25/16  Yes Tereasa Coop, PA-C  amLODipine (NORVASC) 10 MG tablet Take 1 tablet (10 mg total) by mouth daily. Patient not taking: Reported on 05/27/2021 05/11/19   Maximiano Coss, NP  escitalopram (LEXAPRO) 5 MG tablet Take 1 tablet (5 mg total) by mouth at bedtime. Take with a snack.  Do not miss doses. Patient not taking: Reported on 05/27/2021 06/10/18   Forrest Moron, MD  lisinopril (ZESTRIL) 40 MG tablet Take 1 tablet (40 mg total) by mouth daily. Patient not taking: Reported on 05/27/2021 05/11/19   Maximiano Coss, NP   lisinopril-hydrochlorothiazide (ZESTORETIC) 20-25 MG tablet Take 1 tablet by mouth daily. Patient not taking: Reported on 05/27/2021 05/05/19   Maximiano Coss, NP  psyllium (METAMUCIL SMOOTH TEXTURE) 28 % packet Take 1 packet by mouth daily. Start with 1/2 pack daily for three days then move to the full pack daily. Patient not taking: Reported on 05/27/2021 12/06/16   Tereasa Coop, PA-C    Physical Exam: Vitals:   05/28/21 0315 05/28/21 0330 05/28/21 0400 05/28/21 0430  BP: (!) 94/59 (!) 95/57 (!) 107/54 111/68  Pulse: 96 86 90 88  Resp: '20 15 15 14  ' Temp: 98 F (36.7 C)     TempSrc: Oral     SpO2: 93% 96% 93% 93%  Weight:      Height:        Physical Exam Constitutional:      General: He is not in acute distress. HENT:     Head: Normocephalic and atraumatic.     Mouth/Throat:     Comments: Very dry mucous membranes Eyes:     Extraocular Movements: Extraocular movements intact.     Conjunctiva/sclera: Conjunctivae normal.  Cardiovascular:     Rate and Rhythm: Normal rate and regular rhythm.     Pulses: Normal pulses.  Pulmonary:     Effort: Pulmonary effort is normal. No respiratory distress.     Breath sounds: No wheezing or rales.  Abdominal:     General: Bowel sounds are normal. There is no distension.     Palpations: Abdomen is soft.     Tenderness: There is no abdominal tenderness.  Musculoskeletal:        General: No swelling or tenderness.     Cervical back: Normal range of motion and neck supple.  Skin:    General: Skin is warm and dry.  Neurological:     General: No focal deficit present.     Mental Status: He is alert.     Cranial Nerves: No cranial nerve deficit.     Sensory: No sensory deficit.     Motor: No weakness.     Comments: Oriented to self and knows the year is 2022 Not oriented to place No focal neurodeficit     Labs on Admission: I have personally reviewed following labs and imaging studies  CBC: Recent Labs  Lab 05/27/21 2222  05/27/21 2249 05/28/21 0230  WBC 25.4*  --   --   NEUTROABS 22.4*  --   --   HGB 18.0* 18.4* 13.6  HCT 49.3 54.0* 40.0  MCV 87.9  --   --   PLT 557*  --   --    Basic Metabolic Panel: Recent Labs  Lab 05/27/21 2222 05/27/21 2249 05/28/21 0230  NA 125* 125* 125*  K 3.5 3.4* 4.1  CL 84*  --   --   CO2 26  --   --  GLUCOSE 143*  --   --   BUN 25*  --   --   CREATININE 1.23  --   --   CALCIUM 9.0  --   --    GFR: Estimated Creatinine Clearance: 71.8 mL/min (by C-G formula based on SCr of 1.23 mg/dL). Liver Function Tests: Recent Labs  Lab 05/27/21 2222  AST 209*  ALT 103*  ALKPHOS 64  BILITOT 1.5*  PROT 7.0  ALBUMIN 3.6   No results for input(s): LIPASE, AMYLASE in the last 168 hours. No results for input(s): AMMONIA in the last 168 hours. Coagulation Profile: Recent Labs  Lab 05/27/21 2222  INR 0.9   Cardiac Enzymes: Recent Labs  Lab 05/27/21 2222  CKTOTAL 5,600*   BNP (last 3 results) No results for input(s): PROBNP in the last 8760 hours. HbA1C: No results for input(s): HGBA1C in the last 72 hours. CBG: No results for input(s): GLUCAP in the last 168 hours. Lipid Profile: No results for input(s): CHOL, HDL, LDLCALC, TRIG, CHOLHDL, LDLDIRECT in the last 72 hours. Thyroid Function Tests: No results for input(s): TSH, T4TOTAL, FREET4, T3FREE, THYROIDAB in the last 72 hours. Anemia Panel: No results for input(s): VITAMINB12, FOLATE, FERRITIN, TIBC, IRON, RETICCTPCT in the last 72 hours. Urine analysis:    Component Value Date/Time   COLORURINE YELLOW 05/28/2021 0301   APPEARANCEUR HAZY (A) 05/28/2021 0301   LABSPEC 1.027 05/28/2021 0301   PHURINE 6.0 05/28/2021 0301   GLUCOSEU 150 (A) 05/28/2021 0301   HGBUR LARGE (A) 05/28/2021 0301   BILIRUBINUR NEGATIVE 05/28/2021 0301   BILIRUBINUR NEG 04/04/2014 1652   KETONESUR 20 (A) 05/28/2021 0301   PROTEINUR 100 (A) 05/28/2021 0301   UROBILINOGEN 0.2 04/04/2014 1652   NITRITE NEGATIVE 05/28/2021 0301    LEUKOCYTESUR NEGATIVE 05/28/2021 0301    Radiological Exams on Admission: CT HEAD WO CONTRAST  Result Date: 05/28/2021 CLINICAL DATA:  Trauma. EXAM: CT HEAD WITHOUT CONTRAST CT CERVICAL SPINE WITHOUT CONTRAST TECHNIQUE: Multidetector CT imaging of the head and cervical spine was performed following the standard protocol without intravenous contrast. Multiplanar CT image reconstructions of the cervical spine were also generated. COMPARISON:  None. FINDINGS: CT HEAD FINDINGS Brain: Mild age-related atrophy and chronic microvascular ischemic changes. No acute intracranial hemorrhage. No mass effect or midline shift. No extra-axial fluid collection. Vascular: No hyperdense vessel or unexpected calcification. Skull: Normal. Negative for fracture or focal lesion. Sinuses/Orbits: Diffuse mucoperiosteal thickening of paranasal sinuses with partial opacification of several ethmoid air cells and left maxillary sinus. There is air-fluid level in the left maxillary sinus. The mastoid air cells are clear. Other: None CT CERVICAL SPINE FINDINGS Alignment: No acute subluxation. Skull base and vertebrae: No acute fracture. Soft tissues and spinal canal: No prevertebral fluid or swelling. No visible canal hematoma. Disc levels: Multilevel degenerative changes with multilevel facet arthropathy. Upper chest: Probable atelectasis. Other: None IMPRESSION: 1. No acute intracranial pathology. Mild age-related atrophy and chronic microvascular ischemic changes. 2. No acute/traumatic cervical spine pathology. Multilevel degenerative changes. 3. Paranasal sinus disease. Electronically Signed   By: Anner Crete M.D.   On: 05/28/2021 02:10   CT Chest W Contrast  Result Date: 05/28/2021 CLINICAL DATA:  Altered mental status, found down, hypothermic EXAM: CT CHEST WITH CONTRAST TECHNIQUE: Multidetector CT imaging of the chest was performed during intravenous contrast administration. CONTRAST:  178m ISOVUE-370 IOPAMIDOL  (ISOVUE-370) INJECTION 76% COMPARISON:  Chest radiograph dated 05/27/2021 FINDINGS: Cardiovascular: Heart is normal in size.  No pericardial effusion. No evidence of thoracic aortic  aneurysm. Mild atherosclerotic calcifications of the aortic arch. Three vessel coronary atherosclerosis. Mediastinum/Nodes: No suspicious mediastinal lymphadenopathy. Visualized thyroid is unremarkable. Lungs/Pleura: Mild dependent atelectasis in the bilateral upper and lower lobes. Superimposed mild ground-glass opacity in the inferior right middle lobe (series 8/image 103) and bilateral lung bases could reflect mild aspiration, particularly given associated mild endobronchial debris in the right lower lobe bronchi (series 7/image 111). No suspicious pulmonary nodules. No pleural effusion or pneumothorax. Upper Abdomen: Evaluated on dedicated CTA. Musculoskeletal: Nondisplaced right anterolateral 5th and 6th rib fractures (series 7/images 99 and 125). Thoracic spine is within normal limits. IMPRESSION: Nondisplaced right anterolateral 5th and 6th rib fractures. No pneumothorax is seen. Suspected mild aspiration in the right middle lobe and bilateral lung bases. Aortic Atherosclerosis (ICD10-I70.0). Electronically Signed   By: Julian Hy M.D.   On: 05/28/2021 02:17   CT CERVICAL SPINE WO CONTRAST  Result Date: 05/28/2021 CLINICAL DATA:  Trauma. EXAM: CT HEAD WITHOUT CONTRAST CT CERVICAL SPINE WITHOUT CONTRAST TECHNIQUE: Multidetector CT imaging of the head and cervical spine was performed following the standard protocol without intravenous contrast. Multiplanar CT image reconstructions of the cervical spine were also generated. COMPARISON:  None. FINDINGS: CT HEAD FINDINGS Brain: Mild age-related atrophy and chronic microvascular ischemic changes. No acute intracranial hemorrhage. No mass effect or midline shift. No extra-axial fluid collection. Vascular: No hyperdense vessel or unexpected calcification. Skull: Normal.  Negative for fracture or focal lesion. Sinuses/Orbits: Diffuse mucoperiosteal thickening of paranasal sinuses with partial opacification of several ethmoid air cells and left maxillary sinus. There is air-fluid level in the left maxillary sinus. The mastoid air cells are clear. Other: None CT CERVICAL SPINE FINDINGS Alignment: No acute subluxation. Skull base and vertebrae: No acute fracture. Soft tissues and spinal canal: No prevertebral fluid or swelling. No visible canal hematoma. Disc levels: Multilevel degenerative changes with multilevel facet arthropathy. Upper chest: Probable atelectasis. Other: None IMPRESSION: 1. No acute intracranial pathology. Mild age-related atrophy and chronic microvascular ischemic changes. 2. No acute/traumatic cervical spine pathology. Multilevel degenerative changes. 3. Paranasal sinus disease. Electronically Signed   By: Anner Crete M.D.   On: 05/28/2021 02:10   CT Angio Aortobifemoral W and/or Wo Contrast  Result Date: 05/28/2021 CLINICAL DATA:  Found down, cyanotic feet EXAM: CT ANGIOGRAPHY OF ABDOMINAL AORTA WITH ILIOFEMORAL RUNOFF TECHNIQUE: Multidetector CT imaging of the abdomen, pelvis and lower extremities was performed using the standard protocol during bolus administration of intravenous contrast. Multiplanar CT image reconstructions and MIPs were obtained to evaluate the vascular anatomy. CONTRAST:  113m ISOVUE-370 IOPAMIDOL (ISOVUE-370) INJECTION 76% COMPARISON:  None. FINDINGS: VASCULAR Aorta: Patent.  Mild atherosclerotic calcifications. Celiac: Patent.  Atherosclerotic calcifications at the origin. SMA: Patent. Renals: Patent bilaterally. Atherosclerotic calcifications of the origin on the left. IMA: Patent. RIGHT Lower Extremity Inflow: Patent. Outflow: Patent. Runoff: Patent three-vessel runoff. LEFT Lower Extremity Inflow: Patent. Outflow: Patent. Runoff: Patent three-vessel runoff. Veins: Unremarkable. Review of the MIP images confirms the above  findings. NON-VASCULAR Lower chest: Evaluated on dedicated CT chest. Hepatobiliary: Liver is within normal limits. Gallbladder is unremarkable. No intrahepatic or extrahepatic duct dilatation. Pancreas: Within normal limits. Spleen: Within normal limits. Adrenals/Urinary Tract: Mild nodular thickening of the left adrenal gland without discrete mass. Bilateral renal cysts, measuring up to 4.2 cm in the left upper kidney (series 7/image 80). No hydronephrosis. Bladder is within normal limits. Stomach/Bowel: Stomach is within normal limits. No evidence of bowel obstruction. Appendix is not discretely visualized. Sigmoid diverticulosis, without evidence of diverticulitis. Lymphatic: No suspicious  abdominopelvic lymphadenopathy. Reproductive: Prostate is unremarkable. Other: No abdominopelvic ascites. Musculoskeletal: Mild degenerative changes of the visualized thoracolumbar spine. IMPRESSION: Patent three-vessel runoff bilaterally. Additional ancillary findings as above. Electronically Signed   By: Julian Hy M.D.   On: 05/28/2021 02:12   DG Chest Port 1 View  Result Date: 05/27/2021 CLINICAL DATA:  Altered mental status, hypothermia EXAM: PORTABLE CHEST 1 VIEW COMPARISON:  None. FINDINGS: Heart and mediastinal contours are within normal limits. No focal opacities or effusions. No acute bony abnormality. IMPRESSION: No active cardiopulmonary disease. Electronically Signed   By: Rolm Baptise M.D.   On: 05/27/2021 22:41    EKG: Independently reviewed.  Sinus rhythm, QTC 515.  No prior EKG for comparison.  Assessment/Plan Principal Problem:   Severe sepsis ([place]) Active Problems:   COVID-19 virus infection   AKI (acute kidney injury) ([place])   Hyponatremia   Acute metabolic encephalopathy   Severe sepsis secondary to COVID infection and aspiration pneumonia Meets criteria for severe sepsis with hypothermia, hypotension, tachycardia, leukocytosis, lactic acidosis, and acute encephalopathy.  He is  testing positive for COVID, vaccination status unknown.  CT showing mild aspiration in the right middle lobe and bilateral lung bases.  Not hypoxic.  UA not strongly suggestive of infection. -Continue broad-spectrum antibiotics at this time. Lactic acidosis and tachycardia improved after 30 cc/kg fluid boluses, continue IV fluid hydration.  Continue Bair hugger and monitor temperature closely.  Blood culture x2 pending.  Add on urine culture.  Check procalcitonin level and monitor WBC count.  Check TSH and cortisol levels.  Start remdesivir.  No indication for steroids at this time as he is not hypoxic.  Check ferritin, fibrinogen, D-dimer, CRP, and LDH levels.  Airborne and contact precautions.  Continuous pulse ox, supplemental oxygen as needed to keep oxygen saturation above 92%.  Keep n.p.o., aspiration precautions, SLP eval.  Acute traumatic rhabdomyolysis AKI CK above 5000.  Creatinine 1.2, baseline 0.7-0.9.  Given severity of rhabdomyolysis, he was started on bicarbonate infusion in the ED. -Continue bicarb infusion.  Continue IV fluid hydration with isotonic fluids.  Monitor CK, bicarb level, potassium, calcium, and renal function closely.  Avoid nephrotoxic agents/ hold home lisinopril and hydrochlorothiazide.  Acute on chronic hyponatremia He has chronic mild thrombocytopenia in the setting of heavy alcohol use.  Sodium 125 on initial labs.  He was given IV fluid boluses. -Repeat labs and monitor sodium level very closely.  Check serum osmolarity.  Acute metabolic encephalopathy Likely due to conditions listed above.  CT head negative for acute finding.  No focal neurodeficit on exam. -Continue management as mentioned above and monitor closely.  UDS pending.  Elevated troponin High-sensitivity troponin 88 >107.  EKG not suggestive of ACS.  Patient denies chest pain.  Troponin elevation likely due to rhabdomyolysis and demand ischemia in the setting of severe sepsis. -Trend  troponin  Right fifth and sixth rib fractures No evidence of pneumothorax on CT.  Not hypoxic.  Patient is not endorsing any pain. -Incentive spirometry  Fall/ ?syncope Unclear whether this was a mechanical fall or syncopal event. -PT/OT eval, fall precautions.  Check orthostatics.  Echocardiogram, EEG, and carotid Dopplers.  Erythrocytosis and thrombocytosis Seen on initial labs and hemoconcentration due to severe dehydration could be contributing. -Repeat CBC to confirm  Elevated liver enzymes Transaminases elevated with borderline bilirubin elevation and normal alkaline phosphatase.  Suspect this is multifactorial from chronic alcohol abuse, rhabdomyolysis, and severe sepsis. -Continue to monitor.  Avoid hepatotoxic agents.  Hypertension -Avoid antihypertensives at  this time  Alcohol abuse Not tremulous or tachycardic at this time. -CIWA protocol; Ativan as needed.  Thiamine, folate, and multivitamin.  QT prolongation -Cardiac monitoring.  Monitor potassium and magnesium levels.  Avoid QT prolonging drugs if possible.  DVT prophylaxis: Lovenox Code Status: Full code Family Communication: No family available at this time. Disposition Plan: Status is: Inpatient  Remains inpatient appropriate because: Severe sepsis, encephalopathy, rhabdomyolysis  Level of care: Level of care: Progressive  The medical decision making on this patient was of high complexity and the patient is at high risk for clinical deterioration, therefore this is a level 3 visit.  Shela Leff MD [facility]  If 7PM-7AM, please contact night-coverage [email or web address]  05/28/2021, 4:36 AM

## 2021-05-28 NOTE — ED Notes (Signed)
Educated pt on keeping arm straight so the IV pump won't alarm. Pt verbalized understanding.

## 2021-05-28 NOTE — ED Notes (Signed)
Lab called with critical value: COVID positive. MD notified\

## 2021-05-28 NOTE — ED Notes (Signed)
Pt complaining that he is hot. Temp rechecked and bear hugger turned off at this time per pt request. No acute changes noted. Pt denies any complaints. Pt given ice water and passed swallow screen. Will continue to monitor. Pt had coughing spell with thick white mucous production.

## 2021-05-28 NOTE — ED Notes (Signed)
Lab called with critical value: troponin 107. MD notified

## 2021-05-28 NOTE — ED Notes (Signed)
Daughter Lezlie Octave [phone] would like an update

## 2021-05-28 NOTE — ED Notes (Signed)
Patient transported to CT 

## 2021-05-28 NOTE — ED Notes (Signed)
Lab called with critical value: Lactic 4.3. MD notified.

## 2021-05-28 NOTE — ED Provider Notes (Signed)
Care assumed from [facility].  Patient found down in his house and seen for several days.  Cold to the touch.  Confused.  Shivering and weak.  He is being treated for hypothermia and code sepsis.  He received IV fluids and antibiotics.  Initially was not able to feel pulses in his feet but they are present now with warming  Work-up shows hyponatremia which appears to be chronic.  Lactic acidosis and rhabdomyolysis with CK 5600.  Bicarb drip initiated. Found to be COVID-positive  Traumatic imaging remarkable for nondisplaced rib fractures and suspected aspiration.  CT head and C-spine are negative.  Patent three-vessel runoff to lower extremities bilaterally.  No acute intrathoracic or intra-abdominal injury other than the rib fractures  Blood pressure has improved to the 90s.  Patient tolerating p.o. and is able to answer few questions.  Denies any pain.  Temperature has improved to 97.8.  Patient will be admitted to the hospital for hypothermia, sepsis, COVID infection, rhabdomyolysis and acute kidney injury.  Pressure is improving.  Lactate is downtrending.  Does not need pressors at this time.  Discussed with Dr. Loney Loh.      .Critical Care Performed by: Glynn Octave, MD Authorized by: Glynn Octave, MD   Critical care provider statement:    Critical care time (minutes):  75   Critical care time was exclusive of:  Separately billable procedures and treating other patients   Critical care was necessary to treat or prevent imminent or life-threatening deterioration of the following conditions:  Sepsis, trauma, endocrine crisis and dehydration   Critical care was time spent personally by me on the following activities:  Development of treatment plan with patient or surrogate, discussions with consultants, evaluation of patient's response to treatment, examination of patient, ordering and review of laboratory studies, ordering and review of radiographic studies, ordering  and performing treatments and interventions, pulse oximetry, re-evaluation of patient's condition and review of old charts   I assumed direction of critical care for this patient from another provider in my specialty: yes     Care discussed with: admitting provider      Glynn Octave, MD 05/28/21 [phone]

## 2021-05-28 NOTE — ED Notes (Signed)
Admitting MD at bedside.

## 2021-05-28 NOTE — Progress Notes (Signed)
Pharmacy Antibiotic Note  Fred Graham is a 70 y.o. male admitted on 05/27/2021 with sepsis.  Pharmacy has been consulted for cefepime and vancomycin dosing. He is also noted with COVID.  -WBC= 25.4, LA= 2.6 -SCr= 1.23, CrCL ~ 70 -cefepime 2gm given at midnight, vancomycin 2gm given ~ 2am   Plan: -Cefepime 2gm IV q8h -Vancomycin 2000mg  IV q24h (estimated AUC= 464) -Will follow renal function, vancomycin levels, cultures and clinical progress    Height: 6\' 2"  (188 cm) Weight: 103.6 kg (228 lb 8 oz) IBW/kg (Calculated) : 82.2  Temp (24hrs), Avg:97.2 F (36.2 C), Min:95.7 F (35.4 C), Max:98 F (36.7 C)  Recent Labs  Lab 05/27/21 2222 05/27/21 2223 05/28/21 0207  WBC 25.4*  --   --   CREATININE 1.23  --   --   LATICACIDVEN  --  4.3* 2.6*    Estimated Creatinine Clearance: 71.8 mL/min (by C-G formula based on SCr of 1.23 mg/dL).    Allergies  Allergen Reactions   Penicillins     Rash    Thank you for allowing pharmacy to be a part of this patient's care.  2224, PharmD Clinical Pharmacist **Pharmacist phone directory can now be found on [email or web address] ([email or web address]).  Listed under [facility].

## 2021-05-28 NOTE — Progress Notes (Signed)
Clinical/Bedside Swallow Evaluation Patient Details  Name: Fred Graham MRN: [id number] Date of Birth: 11/07/1950  Today's Date: 05/28/2021 Time: SLP Start Time (ACUTE ONLY): 0910 SLP Stop Time (ACUTE ONLY): 0918 SLP Time Calculation (min) (ACUTE ONLY): 8 min  Past Medical History:  Past Medical History:  Diagnosis Date   Allergy    Arthritis    hands, hips   Hypertension    Past Surgical History: History reviewed. No pertinent surgical history. HPI:  Fred Graham is a 70 y.o. male with medical history significant of hypertension, alcohol abuse, depression presented to the ED via EMS after being found down at home at the time of evaluation by law enforcement.  Per EMS, family had not heard from the patient in several days and called for welfare check.Admitted with aspiration pna and COVID 19. ETOH.    Assessment / Plan / Recommendation  Clinical Impression  Pt demonstrates no signs of dysphagia. Denies difficulty. Recommend pt initaite a regular diet and thin liquids. SLP will sign off. SLP Visit Diagnosis: Dysphagia, unspecified (R13.10)    Aspiration Risk       Diet Recommendation Regular;Thin liquid   Liquid Administration via: Cup    Other  Recommendations      Recommendations for follow up therapy are one component of a multi-disciplinary discharge planning process, led by the attending physician.  Recommendations may be updated based on patient status, additional functional criteria and insurance authorization.  Follow up Recommendations No SLP follow up      Assistance Recommended at Discharge    Functional Status Assessment    Frequency and Duration            Prognosis        Swallow Study   General HPI: Fred Graham is a 70 y.o. male with medical history significant of hypertension, alcohol abuse, depression presented to the ED via EMS after being found down at home at the time of evaluation by law enforcement.  Per EMS, family had not heard from the  patient in several days and called for welfare check.Admitted with aspiration pna and COVID 19. ETOH. Type of Study: Bedside Swallow Evaluation Previous Swallow Assessment: none Diet Prior to this Study: NPO Temperature Spikes Noted: No History of Recent Intubation: No Behavior/Cognition: Alert;Cooperative;Pleasant mood Oral Care Completed by SLP: No Oral Cavity - Dentition: Adequate natural dentition Vision: Functional for self-feeding Self-Feeding Abilities: Able to feed self Patient Positioning: Upright in bed Baseline Vocal Quality: Normal Volitional Cough: Strong Volitional Swallow: Able to elicit    Oral/Motor/Sensory Function     Ice Chips     Thin Liquid Thin Liquid: Within functional limits    Nectar Thick Nectar Thick Liquid: Not tested   Honey Thick Honey Thick Liquid: Not tested   Puree Puree: Within functional limits   Solid     Solid: Within functional limits      Fred Graham, Fred Graham 05/28/2021,9:46 AM

## 2021-05-29 ENCOUNTER — Other Ambulatory Visit (HOSPITAL_COMMUNITY): Payer: Medicare Other

## 2021-05-29 ENCOUNTER — Encounter (HOSPITAL_COMMUNITY): Payer: Medicare Other

## 2021-05-29 ENCOUNTER — Inpatient Hospital Stay (HOSPITAL_COMMUNITY): Payer: Medicare Other

## 2021-05-29 DIAGNOSIS — R4182 Altered mental status, unspecified: Secondary | ICD-10-CM

## 2021-05-29 LAB — BLOOD CULTURE ID PANEL (REFLEXED) - BCID2

## 2021-05-29 LAB — CBC WITH DIFFERENTIAL/PLATELET
Abs Immature Granulocytes: 0.17 10*3/uL — ABNORMAL HIGH (ref 0.00–0.07)
Basophils Absolute: 0 10*3/uL (ref 0.0–0.1)
Basophils Relative: 0 %
Eosinophils Absolute: 0 10*3/uL (ref 0.0–0.5)
Eosinophils Relative: 0 %
HCT: 41 % (ref 39.0–52.0)
Hemoglobin: 14.8 g/dL (ref 13.0–17.0)
Immature Granulocytes: 1 %
Lymphocytes Relative: 8 %
Lymphs Abs: 1.3 10*3/uL (ref 0.7–4.0)
MCH: 31.8 pg (ref 26.0–34.0)
MCHC: 36.1 g/dL — ABNORMAL HIGH (ref 30.0–36.0)
MCV: 88 fL (ref 80.0–100.0)
Monocytes Absolute: 0.8 10*3/uL (ref 0.1–1.0)
Monocytes Relative: 5 %
Neutro Abs: 14.8 10*3/uL — ABNORMAL HIGH (ref 1.7–7.7)
Neutrophils Relative %: 86 %
Platelets: 382 10*3/uL (ref 150–400)
RBC: 4.66 MIL/uL (ref 4.22–5.81)
RDW: 12.4 % (ref 11.5–15.5)
WBC: 17.1 10*3/uL — ABNORMAL HIGH (ref 4.0–10.5)
nRBC: 0 % (ref 0.0–0.2)

## 2021-05-29 LAB — COMPREHENSIVE METABOLIC PANEL
ALT: 89 U/L — ABNORMAL HIGH (ref 0–44)
AST: 145 U/L — ABNORMAL HIGH (ref 15–41)
Albumin: 2.9 g/dL — ABNORMAL LOW (ref 3.5–5.0)
Alkaline Phosphatase: 52 U/L (ref 38–126)
Anion gap: 11 (ref 5–15)
BUN: 17 mg/dL (ref 8–23)
CO2: 29 mmol/L (ref 22–32)
Calcium: 8.2 mg/dL — ABNORMAL LOW (ref 8.9–10.3)
Chloride: 85 mmol/L — ABNORMAL LOW (ref 98–111)
Creatinine, Ser: 0.76 mg/dL (ref 0.61–1.24)
GFR, Estimated: >60 mL/min (ref >60)
Glucose, Bld: 91 mg/dL (ref 70–99)
Potassium: 3.2 mmol/L — ABNORMAL LOW (ref 3.5–5.1)
Sodium: 125 mmol/L — ABNORMAL LOW (ref 135–145)
Total Bilirubin: 1.1 mg/dL (ref 0.3–1.2)
Total Protein: 5.7 g/dL — ABNORMAL LOW (ref 6.5–8.1)

## 2021-05-29 LAB — BRAIN NATRIURETIC PEPTIDE: B Natriuretic Peptide: 249.7 pg/mL — ABNORMAL HIGH (ref 0.0–100.0)

## 2021-05-29 LAB — D-DIMER, QUANTITATIVE: D-Dimer, Quant: 1.51 ug/mL-FEU — ABNORMAL HIGH (ref 0.00–0.50)

## 2021-05-29 LAB — PROCALCITONIN: Procalcitonin: 0.11 ng/mL

## 2021-05-29 LAB — URINE CULTURE: Culture: NO GROWTH

## 2021-05-29 LAB — HEPATIC FUNCTION PANEL
ALT: 87 U/L — ABNORMAL HIGH (ref 0–44)
AST: 142 U/L — ABNORMAL HIGH (ref 15–41)
Albumin: 2.9 g/dL — ABNORMAL LOW (ref 3.5–5.0)
Alkaline Phosphatase: 64 U/L (ref 38–126)
Bilirubin, Direct: 0.2 mg/dL (ref 0.0–0.2)
Indirect Bilirubin: 0.8 mg/dL (ref 0.3–0.9)
Total Bilirubin: 1 mg/dL (ref 0.3–1.2)
Total Protein: 5.8 g/dL — ABNORMAL LOW (ref 6.5–8.1)

## 2021-05-29 LAB — C-REACTIVE PROTEIN: CRP: 6.2 mg/dL — ABNORMAL HIGH (ref <1.0)

## 2021-05-29 LAB — SODIUM: Sodium: 129 mmol/L — ABNORMAL LOW (ref 135–145)

## 2021-05-29 LAB — MAGNESIUM: Magnesium: 2 mg/dL (ref 1.7–2.4)

## 2021-05-29 LAB — UREA NITROGEN, URINE: Urea Nitrogen, Ur: 412 mg/dL

## 2021-05-29 MED ORDER — POTASSIUM CHLORIDE CRYS ER 20 MEQ PO TBCR
40.0000 meq | EXTENDED_RELEASE_TABLET | ORAL | Status: AC
  Administered 2021-05-29 (×2): 40 meq via ORAL
  Filled 2021-05-29 (×2): qty 2

## 2021-05-29 MED ORDER — CHLORDIAZEPOXIDE HCL 5 MG PO CAPS
10.0000 mg | ORAL_CAPSULE | Freq: Three times a day (TID) | ORAL | Status: DC
  Administered 2021-05-29 (×2): 10 mg via ORAL
  Filled 2021-05-29 (×2): qty 2

## 2021-05-29 MED ORDER — TOLVAPTAN 15 MG PO TABS
15.0000 mg | ORAL_TABLET | Freq: Once | ORAL | Status: AC
  Administered 2021-05-29: 15 mg via ORAL
  Filled 2021-05-29: qty 1

## 2021-05-29 NOTE — Procedures (Signed)
Patient Name: Fred Graham  MRN: [id number]  Epilepsy Attending: Charlsie Quest  Referring Physician/Provider: John Giovanni, MD Date: 05/29/2021 Duration: 25.14 mins  Patient history: 70yo m with ams. EEG to evaluate for seizure  Level of alertness: Asleep  AEDs during EEG study: None  Technical aspects: This EEG study was done with scalp electrodes positioned according to the 10-20 International system of electrode placement. Electrical activity was acquired at a sampling rate of 500Hz  and reviewed with a high frequency filter of 70Hz  and a low frequency filter of 1Hz . EEG data were recorded continuously and digitally stored.   Description: Sleep was characterized by vertex waves, sleep spindles (12 to 14 Hz), maximal frontocentral region. Hyperventilation and photic stimulation were not performed.     IMPRESSION: This study during sleep only is within normal limits. No seizures or epileptiform discharges were seen throughout the recording.  Quinterious Walraven 

## 2021-05-29 NOTE — TOC CAGE-AID Note (Signed)
Transition of Care [facility]) - CAGE-AID Screening   Patient Details  Name: Fred Graham MRN: [id number] Date of Birth: Jan 13, 1951  [facility] CM/SW Contact:    Erin Sons, LCSW Phone Number: 05/29/2021, 1:13 PM   Clinical Narrative:  CSW attempted to contact pt on room phone but no answer. CSW is notified by RN that pt's daughter is in room. CSW called pt daughter and pt was able to use her phone to speak with CSW. CAGE-AID Completed; score of 0. Pt states he drinks 2-3 beers/day; denies any other substance use. He does not feel he has a problem with his alcohol use; he does not express interest in substance use resources.    CAGE-AID Screening:    Have You Ever Felt You Ought to Cut Down on Your Drinking or Drug Use?: No Have People Annoyed You By Critizing Your Drinking Or Drug Use?: No Have You Felt Bad Or Guilty About Your Drinking Or Drug Use?: No Have You Ever Had a Drink or Used Drugs First Thing In The Morning to Steady Your Nerves or to Get Rid of a Hangover?: No CAGE-AID Score: 0  Substance Abuse Education Offered: Yes

## 2021-05-29 NOTE — Progress Notes (Signed)
EEG complete - results pending 

## 2021-05-29 NOTE — Progress Notes (Signed)
PROGRESS NOTE                                                                                                                                                                                                             Patient Demographics:    Fred Graham, is a 69 y.o. male, DOB - 07-07-1950, FBP:[id number]  Outpatient Primary MD for the patient is Fred Agee, NP    LOS - 1  Admit date - 05/27/2021    Chief Complaint  Patient presents with   Altered Mental Status       Brief Narrative (HPI from H&P)  Fred Graham is a 70 y.o. male with medical history significant of hypertension, alcohol abuse, depression presented to the ED via EMS after being found down at home at the time of evaluation by law enforcement.  Per EMS, family had not heard from the patient in several days and called for welfare check.  Police found the patient down on the floor in his underwear, altered, very cold to touch, and did not have heating on at home, was brought to the hospital with confusion, hypothermia, sepsis due to pneumonia and COVID infection.  He was admitted for further care.   Subjective:   Patient in bed, appears comfortable but confused, denies any headache, no fever, no chest pain or pressure, no shortness of breath , no abdominal pain. No new focal weakness.    Assessment  & Plan :     Severe sepsis due to possible aspiration pneumonia in the setting of COVID-19 infection in a patient who is vaccinated with 2 shots of COVID-vaccine - he is currently on empiric antibiotics which have been titrated down, responding well, also on trial of steroids and remdesivir.  Inflammatory markers are borderline.  Speech to evaluate.  Has been adequately hydrated with IV fluids, sepsis pathophysiology has improved.  Monitor cultures closely .  Speech therapy on board.  Encouraged the patient to sit up in chair in the daytime use I-S and flutter  valve for pulmonary toiletry.  Will advance activity and titrate down oxygen as possible.  2.  Acute on chronic hyponatremia.  Worse after hydration electrolytes suggest SIADH, [place] on 05/29/2021.  3.  Rhabdomyolysis.  Hydrate.  4.  Metobolic encephalopathy.  Improving with supportive care, no focal deficits head CT stable.  5.  Hypothermia.  Due to combination of sepsis and exposure to elements.  Improved with supportive care continue.  6.  Deconditioning and fall.  PT OT.  Hydrated.  7.  Hypertension.  Norvasc and Coreg.  8.  Mildly elevated transaminases.  Asymptomatic due to rhabdo.  Trend.  9. Alcohol abuse with DTs .  Counseled to quit.  On CIWA protocol, added Librium.  10.  Mildly elevated troponin.  Likely demand ischemia from #1 above, check echocardiogram, placed on aspirin, statin and beta-blocker for secondary prevention.  No acute issues.  No chest pain.  11. Hypokalemia - replaced.       Condition - Extremely Guarded  Family Communication  : Daughter Fred Graham [phone] on 05/28/2021, 05/29/2021  Code Status :  Full  Consults  :  None  PUD Prophylaxis : Pepcid   Procedures  :     TTE   CT head and C-spine.  Nonacute.  CTA - Patent three-vessel runoff bilaterally. Additional ancillary findings as above  CT chest -  Nondisplaced right anterolateral 5th and 6th rib fractures. No pneumothorax is seen. Suspected mild aspiration in the right middle lobe and bilateral lung bases. Aortic Atherosclerosis (ICD10-I70.0).       Disposition Plan  :    Status is: Inpatient  Remains inpatient appropriate because: Sepsis due to pneumonia.  DVT Prophylaxis  :    enoxaparin (LOVENOX) injection 40 mg Start: 05/28/21 1600     Lab Results  Component Value Date   PLT 382 05/29/2021    Diet :  Diet Order             DIET SOFT Room service appropriate? Yes; Fluid consistency: Thin  Diet effective now                    Inpatient  Medications  Scheduled Meds:  amLODipine  10 mg Oral Daily   aspirin  81 mg Oral Daily   carvedilol  3.125 mg Oral BID WC   dexamethasone (DECADRON) injection  6 mg Intravenous Q24H   enoxaparin (LOVENOX) injection  40 mg Subcutaneous Q24H   folic acid  1 mg Oral Daily   levofloxacin  750 mg Oral Daily   metroNIDAZOLE  500 mg Oral Q8H   multivitamin with minerals  1 tablet Oral Daily   potassium chloride  40 mEq Oral Q4H   rosuvastatin  10 mg Oral Daily   thiamine  100 mg Oral Daily   Or   thiamine  100 mg Intravenous Daily   Continuous Infusions:  remdesivir 100 mg in NS 100 mL 100 mg (05/29/21 1052)   PRN Meds:.loperamide, LORazepam **OR** LORazepam  Antibiotics  :    Anti-infectives (From admission, onward)    Start     Dose/Rate Route Frequency Ordered Stop   05/29/21 1000  remdesivir 100 mg in sodium chloride 0.9 % 100 mL IVPB       See Hyperspace for full Linked Orders Report.   100 mg 200 mL/hr over 30 Minutes Intravenous Daily 05/28/21 0530 06/02/21 0959   05/29/21 0200  vancomycin (VANCOREADY) IVPB 2000 mg/400 mL  Status:  Discontinued        2,000 mg 200 mL/hr over 120 Minutes Intravenous Every 24 hours 05/28/21 0538 05/28/21 0942   05/28/21 1100  levofloxacin (LEVAQUIN) tablet 750 mg        750 mg Oral Daily 05/28/21 0943 06/01/21 0959   05/28/21 1100  metroNIDAZOLE (FLAGYL) tablet 500 mg  500 mg Oral Every 8 hours 05/28/21 0943     05/28/21 0800  metroNIDAZOLE (FLAGYL) IVPB 500 mg  Status:  Discontinued        500 mg 100 mL/hr over 60 Minutes Intravenous Every 8 hours 05/28/21 0523 05/28/21 0942   05/28/21 0800  ceFEPIme (MAXIPIME) 2 g in sodium chloride 0.9 % 100 mL IVPB  Status:  Discontinued        2 g 200 mL/hr over 30 Minutes Intravenous Every 8 hours 05/28/21 0538 05/28/21 0943   05/28/21 0530  remdesivir 200 mg in sodium chloride 0.9% 250 mL IVPB       See Hyperspace for full Linked Orders Report.   200 mg 580 mL/hr over 30 Minutes Intravenous  Once 05/28/21 0530 05/28/21 0754   05/27/21 2330  ceFEPIme (MAXIPIME) 2 g in sodium chloride 0.9 % 100 mL IVPB        2 g 200 mL/hr over 30 Minutes Intravenous  Once 05/27/21 2315 05/28/21 0205   05/27/21 2330  metroNIDAZOLE (FLAGYL) IVPB 500 mg        500 mg 100 mL/hr over 60 Minutes Intravenous  Once 05/27/21 2315 05/28/21 0205   05/27/21 2330  vancomycin (VANCOREADY) IVPB 2000 mg/400 mL        2,000 mg 200 mL/hr over 120 Minutes Intravenous  Once 05/27/21 2315 05/28/21 0422        Time Spent in minutes  30   Susa Raring M.D on 05/29/2021 at 1:48 PM  To page go to [email or web address]   [facility] -  Office  [phone]  See all Orders from today for further details    Objective:   Vitals:   05/29/21 0731 05/29/21 0745 05/29/21 0900 05/29/21 1207  BP:  (!) 154/86 (!) 186/101 (!) 164/79  Pulse:  86 90 61  Resp:  16 16 17   Temp: 97.8 F (36.6 C)  97.6 F (36.4 C) 98.2 F (36.8 C)  TempSrc: Oral  Oral Oral  SpO2:  96% 91% 90%  Weight:      Height:        Wt Readings from Last 3 Encounters:  05/28/21 103.6 kg  05/05/19 93 kg  10/20/18 95.3 kg     Intake/Output Summary (Last 24 hours) at 05/29/2021 1348 Last data filed at 05/29/2021 1300 Gross per 24 hour  Intake 803.24 ml  Output 500 ml  Net 303.24 ml     Physical Exam  Awake Alert x 1, No new F.N deficits, Anxious [place].AT,PERRAL Supple Neck, No JVD,   Symmetrical Chest wall movement, Good air movement bilaterally, CTAB RRR,No Gallops, Rubs or new Murmurs,  +ve B.Sounds, Abd Soft, No tenderness,   No Cyanosis, Clubbing or edema        Data Review:    CBC Recent Labs  Lab 05/27/21 2222 05/27/21 2249 05/28/21 0230 05/28/21 0537 05/28/21 0745 05/29/21 0628  WBC 25.4*  --   --  24.2* 19.4* 17.1*  HGB 18.0* 18.4* 13.6 15.0 13.7 14.8  HCT 49.3 54.0* 40.0 41.2 37.9* 41.0  PLT 557*  --   --  439* 380 382  MCV 87.9  --   --  88.6 89.6 88.0  MCH 32.1  --   --  32.3 32.4 31.8  MCHC 36.5*   --   --  36.4* 36.1* 36.1*  RDW 12.4  --   --  12.6 12.7 12.4  LYMPHSABS 1.4  --   --   --   --  1.3  MONOABS 1.1*  --   --   --   --  0.8  EOSABS 0.0  --   --   --   --  0.0  BASOSABS 0.1  --   --   --   --  0.0    Electrolytes Recent Labs  Lab 05/27/21 2222 05/27/21 2223 05/27/21 2249 05/28/21 0207 05/28/21 0230 05/28/21 0537 05/28/21 0745 05/29/21 0628  NA 125*  --  125*  --  125* 124* 126* 125*  K 3.5  --  3.4*  --  4.1 3.6 4.0 3.2*  CL 84*  --   --   --   --  89* 88* 85*  CO2 26  --   --   --   --  GLUCOSE 143*  --   --   --   --  101* 75 91  BUN 25*  --   --   --   --  28* 29* 17  CREATININE 1.23  --   --   --   --  1.11 1.12 0.76  CALCIUM 9.0  --   --   --   --  7.9* 7.6* 8.2*  AST 209*  --   --   --   --  162*  --  142*  145*  ALT 103*  --   --   --   --  81*  --  87*  89*  ALKPHOS 64  --   --   --   --  52  --  64  52  BILITOT 1.5*  --   --   --   --  1.0  --  1.0  1.1  ALBUMIN 3.6  --   --   --   --  2.8*  --  2.9*  2.9*  MG  --   --   --   --   --  2.0 2.0 2.0  CRP  --   --   --   --   --  6.4*  --  6.2*  DDIMER  --   --   --   --   --  0.87* 0.92* 1.51*  PROCALCITON  --   --   --   --   --  9.04  --  0.11  LATICACIDVEN  --  4.3*  --  2.6*  --   --   --   --   INR 0.9  --   --   --   --   --   --   --   TSH  --   --   --   --   --  2.252  --   --   BNP  --   --   --   --   --   --   --  249.7*    ------------------------------------------------------------------------------------------------------------------ No results for input(s): CHOL, HDL, LDLCALC, TRIG, CHOLHDL, LDLDIRECT in the last 72 hours.  No results found for: HGBA1C  Recent Labs    05/28/21 0537  TSH 2.252   ------------------------------------------------------------------------------------------------------------------ ID Labs Recent Labs  Lab 05/27/21 2222 05/27/21 2223 05/28/21 0207 05/28/21 0537 05/28/21 0745 05/29/21 0628  WBC 25.4*  --   --  24.2* 19.4* 17.1*   PLT 557*  --   --  439* 380 382  CRP  --   --   --  6.4*  --  6.2*  DDIMER  --   --   --  0.87* 0.92* 1.51*  PROCALCITON  --   --   --  9.04  --  0.11  LATICACIDVEN  --  4.3* 2.6*  --   --   --   CREATININE 1.23  --   --  1.11 1.12 0.76   Cardiac Enzymes No results for input(s): CKMB, TROPONINI, MYOGLOBIN in the last 168 hours.  Invalid input(s): CK      Radiology Reports CT HEAD WO CONTRAST  Result Date: 05/28/2021 CLINICAL DATA:  Trauma. EXAM: CT HEAD WITHOUT CONTRAST CT CERVICAL SPINE WITHOUT CONTRAST TECHNIQUE: Multidetector CT imaging of the head and cervical spine was performed following the standard protocol without intravenous contrast. Multiplanar CT image reconstructions of the cervical spine were also generated. COMPARISON:  None. FINDINGS: CT HEAD FINDINGS Brain: Mild age-related atrophy and chronic microvascular ischemic changes. No acute intracranial hemorrhage. No mass effect or midline shift. No extra-axial fluid collection. Vascular: No hyperdense vessel or unexpected calcification. Skull: Normal. Negative for fracture or focal lesion. Sinuses/Orbits: Diffuse mucoperiosteal thickening of paranasal sinuses with partial opacification of several ethmoid air cells and left maxillary sinus. There is air-fluid level in the left maxillary sinus. The mastoid air cells are clear. Other: None CT CERVICAL SPINE FINDINGS Alignment: No acute subluxation. Skull base and vertebrae: No acute fracture. Soft tissues and spinal canal: No prevertebral fluid or swelling. No visible canal hematoma. Disc levels: Multilevel degenerative changes with multilevel facet arthropathy. Upper chest: Probable atelectasis. Other: None IMPRESSION: 1. No acute intracranial pathology. Mild age-related atrophy and chronic microvascular ischemic changes. 2. No acute/traumatic cervical spine pathology. Multilevel degenerative changes. 3. Paranasal sinus disease. Electronically Signed   By: Elgie Collard M.D.   On:  05/28/2021 02:10   CT Chest W Contrast  Result Date: 05/28/2021 CLINICAL DATA:  Altered mental status, found down, hypothermic EXAM: CT CHEST WITH CONTRAST TECHNIQUE: Multidetector CT imaging of the chest was performed during intravenous contrast administration. CONTRAST:  ISOVUE-370 IOPAMIDOL (ISOVUE-370) INJECTION 76% COMPARISON:  Chest radiograph dated 05/27/2021 FINDINGS: Cardiovascular: Heart is normal in size.  No pericardial effusion. No evidence of thoracic aortic aneurysm. Mild atherosclerotic calcifications of the aortic arch. Three vessel coronary atherosclerosis. Mediastinum/Nodes: No suspicious mediastinal lymphadenopathy. Visualized thyroid is unremarkable. Lungs/Pleura: Mild dependent atelectasis in the bilateral upper and lower lobes. Superimposed mild ground-glass opacity in the inferior right middle lobe (series 8/image 103) and bilateral lung bases could reflect mild aspiration, particularly given associated mild endobronchial debris in the right lower lobe bronchi (series 7/image 111). No suspicious pulmonary nodules. No pleural effusion or pneumothorax. Upper Abdomen: Evaluated on dedicated CTA. Musculoskeletal: Nondisplaced right anterolateral 5th and 6th rib fractures (series 7/images 99 and 125). Thoracic spine is within normal limits. IMPRESSION: Nondisplaced right anterolateral 5th and 6th rib fractures. No pneumothorax is seen. Suspected mild aspiration in the right middle lobe and bilateral lung bases. Aortic Atherosclerosis (ICD10-I70.0). Electronically Signed   By: Charline Bills M.D.   On: 05/28/2021 02:17   CT CERVICAL SPINE WO CONTRAST  Result Date: 05/28/2021 CLINICAL DATA:  Trauma. EXAM: CT HEAD WITHOUT CONTRAST CT CERVICAL SPINE WITHOUT CONTRAST TECHNIQUE: Multidetector CT imaging of the head and cervical spine was performed following the standard protocol without intravenous contrast. Multiplanar CT image reconstructions of the cervical spine were also  generated. COMPARISON:  None. FINDINGS: CT HEAD FINDINGS Brain: Mild age-related atrophy and chronic microvascular ischemic changes. No acute intracranial hemorrhage. No mass effect or midline shift. No extra-axial fluid collection. Vascular: No hyperdense vessel or unexpected calcification. Skull: Normal.  Negative for fracture or focal lesion. Sinuses/Orbits: Diffuse mucoperiosteal thickening of paranasal sinuses with partial opacification of several ethmoid air cells and left maxillary sinus. There is air-fluid level in the left maxillary sinus. The mastoid air cells are clear. Other: None CT CERVICAL SPINE FINDINGS Alignment: No acute subluxation. Skull base and vertebrae: No acute fracture. Soft tissues and spinal canal: No prevertebral fluid or swelling. No visible canal hematoma. Disc levels: Multilevel degenerative changes with multilevel facet arthropathy. Upper chest: Probable atelectasis. Other: None IMPRESSION: 1. No acute intracranial pathology. Mild age-related atrophy and chronic microvascular ischemic changes. 2. No acute/traumatic cervical spine pathology. Multilevel degenerative changes. 3. Paranasal sinus disease. Electronically Signed   By: Elgie Collard M.D.   On: 05/28/2021 02:10   CT Angio Aortobifemoral W and/or Wo Contrast  Result Date: 05/28/2021 CLINICAL DATA:  Found down, cyanotic feet EXAM: CT ANGIOGRAPHY OF ABDOMINAL AORTA WITH ILIOFEMORAL RUNOFF TECHNIQUE: Multidetector CT imaging of the abdomen, pelvis and lower extremities was performed using the standard protocol during bolus administration of intravenous contrast. Multiplanar CT image reconstructions and MIPs were obtained to evaluate the vascular anatomy. CONTRAST:  ISOVUE-370 IOPAMIDOL (ISOVUE-370) INJECTION 76% COMPARISON:  None. FINDINGS: VASCULAR Aorta: Patent.  Mild atherosclerotic calcifications. Celiac: Patent.  Atherosclerotic calcifications at the origin. SMA: Patent. Renals: Patent bilaterally.  Atherosclerotic calcifications of the origin on the left. IMA: Patent. RIGHT Lower Extremity Inflow: Patent. Outflow: Patent. Runoff: Patent three-vessel runoff. LEFT Lower Extremity Inflow: Patent. Outflow: Patent. Runoff: Patent three-vessel runoff. Veins: Unremarkable. Review of the MIP images confirms the above findings. NON-VASCULAR Lower chest: Evaluated on dedicated CT chest. Hepatobiliary: Liver is within normal limits. Gallbladder is unremarkable. No intrahepatic or extrahepatic duct dilatation. Pancreas: Within normal limits. Spleen: Within normal limits. Adrenals/Urinary Tract: Mild nodular thickening of the left adrenal gland without discrete mass. Bilateral renal cysts, measuring up to 4.2 cm in the left upper kidney (series 7/image 80). No hydronephrosis. Bladder is within normal limits. Stomach/Bowel: Stomach is within normal limits. No evidence of bowel obstruction. Appendix is not discretely visualized. Sigmoid diverticulosis, without evidence of diverticulitis. Lymphatic: No suspicious abdominopelvic lymphadenopathy. Reproductive: Prostate is unremarkable. Other: No abdominopelvic ascites. Musculoskeletal: Mild degenerative changes of the visualized thoracolumbar spine. IMPRESSION: Patent three-vessel runoff bilaterally. Additional ancillary findings as above. Electronically Signed   By: Charline Bills M.D.   On: 05/28/2021 02:12   DG Chest Port 1 View  Result Date: 05/29/2021 CLINICAL DATA:  70 year old male with history of shortness of breath. COVID positive patient. EXAM: PORTABLE CHEST 1 VIEW COMPARISON:  Chest x-ray 05/28/2021. FINDINGS: Low lung volumes. Bibasilar opacities which may reflect areas of atelectasis and/or consolidation. Small bilateral pleural effusions. No pneumothorax. No evidence of pulmonary edema. Heart size is normal. Upper mediastinal contours are within normal limits. IMPRESSION: 1. Low lung volumes with bibasilar areas of atelectasis and/or consolidation and  small bilateral pleural effusions. Electronically Signed   By: Trudie Reed M.D.   On: 05/29/2021 08:03   DG Chest Port 1 View  Result Date: 05/28/2021 CLINICAL DATA:  70 year old male found down.  Positive COVID-19. EXAM: PORTABLE CHEST 1 VIEW COMPARISON:  CT chest 0102 hours today and earlier. FINDINGS: Portable AP upright view at 0757 hours. Mildly lower lung volumes compared to yesterday. Normal cardiac size and mediastinal contours. No pneumothorax or pleural effusion. Allowing for portable technique the lungs are clear. Visualized tracheal air column is within normal limits. Right 5th and 6th rib fractures better demonstrated by CT. No new osseous abnormality identified. Negative visible  bowel gas. IMPRESSION: 1. No new cardiopulmonary abnormality. 2. Right 5th and 6th rib fractures better demonstrated by CT today. Electronically Signed   By: Odessa Fleming M.D.   On: 05/28/2021 08:09   DG Chest Port 1 View  Result Date: 05/27/2021 CLINICAL DATA:  Altered mental status, hypothermia EXAM: PORTABLE CHEST 1 VIEW COMPARISON:  None. FINDINGS: Heart and mediastinal contours are within normal limits. No focal opacities or effusions. No acute bony abnormality. IMPRESSION: No active cardiopulmonary disease. Electronically Signed   By: Charlett Nose M.D.   On: 05/27/2021 22:41

## 2021-05-29 NOTE — Progress Notes (Signed)
PHARMACY - PHYSICIAN COMMUNICATION CRITICAL VALUE ALERT - BLOOD CULTURE IDENTIFICATION (BCID)  Fred Graham is an 70 y.o. male who presented to [facility] on 05/27/2021 with a chief complaint of AMS.  Assessment:  Started on broad-spectrum ABX for sepsis but narrowed for Covid and aspiration pneumonia, now growing Staph epi in 1 of 4 blood cx bottles.  Name of physician (or Provider) ContactedAlphonzo Lemmings MD  Current antibiotics: metronidazole  Changes to prescribed antibiotics recommended:  No further ABX indicated at this time.  Results for orders placed or performed during the hospital encounter of 05/27/21  Blood Culture ID Panel (Reflexed) (Collected: 05/28/2021 12:05 AM)  Result Value Ref Range   Enterococcus faecalis NOT DETECTED NOT DETECTED   Enterococcus Faecium NOT DETECTED NOT DETECTED   Listeria monocytogenes NOT DETECTED NOT DETECTED   Staphylococcus species DETECTED (A) NOT DETECTED   Staphylococcus aureus (BCID) NOT DETECTED NOT DETECTED   Staphylococcus epidermidis DETECTED (A) NOT DETECTED   Staphylococcus lugdunensis NOT DETECTED NOT DETECTED   Streptococcus species NOT DETECTED NOT DETECTED   Streptococcus agalactiae NOT DETECTED NOT DETECTED   Streptococcus pneumoniae NOT DETECTED NOT DETECTED   Streptococcus pyogenes NOT DETECTED NOT DETECTED   A.calcoaceticus-baumannii NOT DETECTED NOT DETECTED   Bacteroides fragilis NOT DETECTED NOT DETECTED   Enterobacterales NOT DETECTED NOT DETECTED   Enterobacter cloacae complex NOT DETECTED NOT DETECTED   Escherichia coli NOT DETECTED NOT DETECTED   Klebsiella aerogenes NOT DETECTED NOT DETECTED   Klebsiella oxytoca NOT DETECTED NOT DETECTED   Klebsiella pneumoniae NOT DETECTED NOT DETECTED   Proteus species NOT DETECTED NOT DETECTED   Salmonella species NOT DETECTED NOT DETECTED   Serratia marcescens NOT DETECTED NOT DETECTED   Haemophilus influenzae NOT DETECTED NOT DETECTED   Neisseria meningitidis NOT  DETECTED NOT DETECTED   Pseudomonas aeruginosa NOT DETECTED NOT DETECTED   Stenotrophomonas maltophilia NOT DETECTED NOT DETECTED   Candida albicans NOT DETECTED NOT DETECTED   Candida auris NOT DETECTED NOT DETECTED   Candida glabrata NOT DETECTED NOT DETECTED   Candida krusei NOT DETECTED NOT DETECTED   Candida parapsilosis NOT DETECTED NOT DETECTED   Candida tropicalis NOT DETECTED NOT DETECTED   Cryptococcus neoformans/gattii NOT DETECTED NOT DETECTED   Methicillin resistance mecA/C NOT DETECTED NOT DETECTED    Vernard Gambles, PharmD, BCPS  05/29/2021  2:47 AM

## 2021-05-29 NOTE — ED Notes (Signed)
Breakfast order placed ?

## 2021-05-30 ENCOUNTER — Inpatient Hospital Stay (HOSPITAL_COMMUNITY): Payer: Medicare Other

## 2021-05-30 DIAGNOSIS — R55 Syncope and collapse: Secondary | ICD-10-CM

## 2021-05-30 LAB — COMPREHENSIVE METABOLIC PANEL
ALT: 90 U/L — ABNORMAL HIGH (ref 0–44)
AST: 110 U/L — ABNORMAL HIGH (ref 15–41)
Albumin: 2.8 g/dL — ABNORMAL LOW (ref 3.5–5.0)
Alkaline Phosphatase: 48 U/L (ref 38–126)
Anion gap: 11 (ref 5–15)
BUN: 14 mg/dL (ref 8–23)
CO2: 25 mmol/L (ref 22–32)
Calcium: 8.6 mg/dL — ABNORMAL LOW (ref 8.9–10.3)
Chloride: 94 mmol/L — ABNORMAL LOW (ref 98–111)
Creatinine, Ser: 0.84 mg/dL (ref 0.61–1.24)
GFR, Estimated: >60 mL/min (ref >60)
Glucose, Bld: 125 mg/dL — ABNORMAL HIGH (ref 70–99)
Potassium: 3.7 mmol/L (ref 3.5–5.1)
Sodium: 130 mmol/L — ABNORMAL LOW (ref 135–145)
Total Bilirubin: 0.8 mg/dL (ref 0.3–1.2)
Total Protein: 5.8 g/dL — ABNORMAL LOW (ref 6.5–8.1)

## 2021-05-30 LAB — CBC WITH DIFFERENTIAL/PLATELET
Abs Immature Granulocytes: 0.11 10*3/uL — ABNORMAL HIGH (ref 0.00–0.07)
Basophils Absolute: 0 10*3/uL (ref 0.0–0.1)
Basophils Relative: 0 %
Eosinophils Absolute: 0 10*3/uL (ref 0.0–0.5)
Eosinophils Relative: 0 %
HCT: 42.2 % (ref 39.0–52.0)
Hemoglobin: 15.7 g/dL (ref 13.0–17.0)
Immature Granulocytes: 1 %
Lymphocytes Relative: 9 %
Lymphs Abs: 1.3 10*3/uL (ref 0.7–4.0)
MCH: 32.6 pg (ref 26.0–34.0)
MCHC: 37.2 g/dL — ABNORMAL HIGH (ref 30.0–36.0)
MCV: 87.6 fL (ref 80.0–100.0)
Monocytes Absolute: 0.5 10*3/uL (ref 0.1–1.0)
Monocytes Relative: 4 %
Neutro Abs: 13.6 10*3/uL — ABNORMAL HIGH (ref 1.7–7.7)
Neutrophils Relative %: 86 %
Platelets: 304 10*3/uL (ref 150–400)
RBC: 4.82 MIL/uL (ref 4.22–5.81)
RDW: 12.4 % (ref 11.5–15.5)
WBC: 15.6 10*3/uL — ABNORMAL HIGH (ref 4.0–10.5)
nRBC: 0 % (ref 0.0–0.2)

## 2021-05-30 LAB — ECHOCARDIOGRAM COMPLETE
Area-P 1/2: 3.68 cm2
Calc EF: 48.9 %
Height: 74 in
S' Lateral: 2.8 cm
Single Plane A2C EF: 51.2 %
Single Plane A4C EF: 48.3 %
Weight: 3656 oz

## 2021-05-30 LAB — PROCALCITONIN: Procalcitonin: <0.1 ng/mL

## 2021-05-30 LAB — C-REACTIVE PROTEIN: CRP: 3.8 mg/dL — ABNORMAL HIGH (ref <1.0)

## 2021-05-30 LAB — D-DIMER, QUANTITATIVE: D-Dimer, Quant: 1.11 ug/mL-FEU — ABNORMAL HIGH (ref 0.00–0.50)

## 2021-05-30 LAB — BRAIN NATRIURETIC PEPTIDE: B Natriuretic Peptide: 352.7 pg/mL — ABNORMAL HIGH (ref 0.0–100.0)

## 2021-05-30 LAB — MAGNESIUM: Magnesium: 2.2 mg/dL (ref 1.7–2.4)

## 2021-05-30 MED ORDER — TOLVAPTAN 15 MG PO TABS
15.0000 mg | ORAL_TABLET | Freq: Once | ORAL | Status: AC
  Administered 2021-05-30: 15 mg via ORAL
  Filled 2021-05-30 (×2): qty 1

## 2021-05-30 MED ORDER — ORAL CARE MOUTH RINSE
15.0000 mL | Freq: Two times a day (BID) | OROMUCOSAL | Status: DC
  Administered 2021-05-31 – 2021-06-12 (×23): 15 mL via OROMUCOSAL

## 2021-05-30 MED ORDER — LORAZEPAM 2 MG/ML IJ SOLN
1.0000 mg | INTRAMUSCULAR | Status: DC | PRN
  Administered 2021-05-30 – 2021-05-31 (×4): 2 mg via INTRAVENOUS
  Filled 2021-05-30 (×5): qty 1

## 2021-05-30 MED ORDER — LIP MEDEX EX OINT
1.0000 "application " | TOPICAL_OINTMENT | CUTANEOUS | Status: DC | PRN
  Administered 2021-06-11: 1 via TOPICAL
  Filled 2021-05-30 (×2): qty 7

## 2021-05-30 MED ORDER — CHLORHEXIDINE GLUCONATE 0.12 % MT SOLN
15.0000 mL | Freq: Two times a day (BID) | OROMUCOSAL | Status: DC
  Administered 2021-05-30 – 2021-06-12 (×23): 15 mL via OROMUCOSAL
  Filled 2021-05-30 (×20): qty 15

## 2021-05-30 MED ORDER — CHLORDIAZEPOXIDE HCL 5 MG PO CAPS
5.0000 mg | ORAL_CAPSULE | Freq: Three times a day (TID) | ORAL | Status: DC
  Administered 2021-05-30 – 2021-05-31 (×3): 5 mg via ORAL
  Filled 2021-05-30 (×3): qty 1

## 2021-05-30 MED ORDER — CHLORDIAZEPOXIDE HCL 5 MG PO CAPS: 5.0000 mg | ORAL_CAPSULE | Freq: Three times a day (TID) | ORAL | Status: DC

## 2021-05-30 NOTE — Progress Notes (Signed)
PROGRESS NOTE                                                                                                                                                                                                             Patient Demographics:    Fred Graham, is a 70 y.o. male, DOB - 30-Apr-1951, ZOX:[id number]  Outpatient Primary MD for the patient is [name], NP    LOS - 2  Admit date - 05/27/2021    Chief Complaint  Patient presents with   Altered Mental Status       Brief Narrative (HPI from H&P)  Fred Graham is a 70 y.o. male with medical history significant of hypertension, alcohol abuse, depression presented to the ED via EMS after being found down at home at the time of evaluation by law enforcement.  Per EMS, family had not heard from the patient in several days and called for welfare check.  Police found the patient down on the floor in his underwear, altered, very cold to touch, and did not have heating on at home, was brought to the hospital with confusion, hypothermia, sepsis due to pneumonia and COVID infection.  He was admitted for further care.   Subjective:   Patient in bed, sedated from Ativan this morning, unable to answer questions or follow commands.    Assessment  & Plan :     Severe sepsis due to possible aspiration pneumonia in the setting of COVID-19 infection in a patient who is vaccinated with 2 shots of COVID-vaccine - he is currently on empiric antibiotics which have been titrated down, responding well, also on trial of steroids and remdesivir.  Inflammatory markers are borderline.  Speech to evaluate.  Has been adequately hydrated with IV fluids, sepsis pathophysiology has improved.  Monitor cultures closely, prelim cultures few minutes ago show 3 out of 4 staph epidermis which could be a contamination, leukocytosis has improved and procalcitonin has trended to normal after empiric Levaquin and  Flagyl treatment for aspiration pneumonia.  Will discuss with ID.  Repeat cultures have been ordered and echo is pending.  Encouraged the patient to sit up in chair in the daytime use I-S and flutter valve for pulmonary toiletry.  Will advance activity and titrate down oxygen as possible.  2.  Acute on chronic hyponatremia.  Worse after hydration  electrolytes suggest SIADH, Samsca on 05/29/2021 with improvement repeated dose on 05/30/2021.  3.  Rhabdomyolysis.  Hydrated.  4.  Metobolic encephalopathy.  Improving with supportive care, no focal deficits head CT stable.  5.  Hypothermia.  Due to combination of sepsis and exposure to elements.  Resolved after supportive care for aspiration pneumonia.  6.  Deconditioning and fall.  PT OT.  Hydrated.  7.  Hypertension.  Norvasc and Coreg.  8.  Mildly elevated transaminases.  Asymptomatic due to rhabdo.  Trend.  9. Alcohol abuse with DTs .  Counseled to quit.  On CIWA protocol, along with Librium.  Continue to monitor.  10.  Mildly elevated troponin.  Likely demand ischemia from #1 above, check echocardiogram, placed on aspirin, statin and beta-blocker for secondary prevention.  No acute issues.  No chest pain.  11. Hypokalemia - replaced.       Condition - Extremely Guarded  Family Communication  : Daughter [name] [phone] on 05/28/2021, 05/29/2021  Code Status :  Full  Consults  :  None  PUD Prophylaxis : Pepcid   Procedures  :     TTE -   EEG - non acute  CT head and C-spine.  Nonacute.  CTA - Patent three-vessel runoff bilaterally. Additional ancillary findings as above  CT chest -  Nondisplaced right anterolateral 5th and 6th rib fractures. No pneumothorax is seen. Suspected mild aspiration in the right middle lobe and bilateral lung bases. Aortic Atherosclerosis (ICD10-I70.0).       Disposition Plan  :    Status is: Inpatient  Remains inpatient appropriate because: Sepsis due to pneumonia.  DVT Prophylaxis   :    enoxaparin (LOVENOX) injection 40 mg Start: 05/28/21 1600     Lab Results  Component Value Date   PLT 304 05/30/2021    Diet :  Diet Order             DIET SOFT Room service appropriate? Yes; Fluid consistency: Thin  Diet effective now                    Inpatient Medications  Scheduled Meds:  amLODipine  10 mg Oral Daily   aspirin  81 mg Oral Daily   carvedilol  3.125 mg Oral BID WC   chlordiazePOXIDE  5 mg Oral TID   dexamethasone (DECADRON) injection  6 mg Intravenous Q24H   enoxaparin (LOVENOX) injection  40 mg Subcutaneous Q24H   folic acid  1 mg Oral Daily   levofloxacin  750 mg Oral Daily   metroNIDAZOLE  500 mg Oral Q8H   multivitamin with minerals  1 tablet Oral Daily   rosuvastatin  10 mg Oral Daily   thiamine  100 mg Oral Daily   Or   thiamine  100 mg Intravenous Daily   tolvaptan  15 mg Oral Once   Continuous Infusions:  remdesivir 100 mg in NS 100 mL 100 mg (05/29/21 1052)   PRN Meds:.loperamide, [DISCONTINUED] LORazepam **OR** LORazepam  Antibiotics  :    Anti-infectives (From admission, onward)    Start     Dose/Rate Route Frequency Ordered Stop   05/29/21 1000  remdesivir 100 mg in sodium chloride 0.9 % 100 mL IVPB       See Hyperspace for full Linked Orders Report.   100 mg 200 mL/hr over 30 Minutes Intravenous Daily 05/28/21 0530 06/02/21 0959   05/29/21 0200  vancomycin (VANCOREADY) IVPB 2000 mg/400 mL  Status:  Discontinued  2,000 mg 200 mL/hr over 120 Minutes Intravenous Every 24 hours 05/28/21 0538 05/28/21 0942   05/28/21 1100  levofloxacin (LEVAQUIN) tablet 750 mg        750 mg Oral Daily 05/28/21 0943 06/01/21 0959   05/28/21 1100  metroNIDAZOLE (FLAGYL) tablet 500 mg        500 mg Oral Every 8 hours 05/28/21 0943     05/28/21 0800  metroNIDAZOLE (FLAGYL) IVPB 500 mg  Status:  Discontinued        500 mg 100 mL/hr over 60 Minutes Intravenous Every 8 hours 05/28/21 0523 05/28/21 0942   05/28/21 0800  ceFEPIme  (MAXIPIME) 2 g in sodium chloride 0.9 % 100 mL IVPB  Status:  Discontinued        2 g 200 mL/hr over 30 Minutes Intravenous Every 8 hours 05/28/21 0538 05/28/21 0943   05/28/21 0530  remdesivir 200 mg in sodium chloride 0.9% 250 mL IVPB       See Hyperspace for full Linked Orders Report.   200 mg 580 mL/hr over 30 Minutes Intravenous Once 05/28/21 0530 05/28/21 0754   05/27/21 2330  ceFEPIme (MAXIPIME) 2 g in sodium chloride 0.9 % 100 mL IVPB        2 g 200 mL/hr over 30 Minutes Intravenous  Once 05/27/21 2315 05/28/21 0205   05/27/21 2330  metroNIDAZOLE (FLAGYL) IVPB 500 mg        500 mg 100 mL/hr over 60 Minutes Intravenous  Once 05/27/21 2315 05/28/21 0205   05/27/21 2330  vancomycin (VANCOREADY) IVPB 2000 mg/400 mL        2,000 mg 200 mL/hr over 120 Minutes Intravenous  Once 05/27/21 2315 05/28/21 0422        Time Spent in minutes  30   Susa Raring M.D on 05/30/2021 at 10:18 AM  To page go to [email or web address]   [facility] -  Office  [phone]  See all Orders from today for further details    Objective:   Vitals:   05/30/21 0100 05/30/21 0400 05/30/21 0805 05/30/21 0955  BP:  (!) 163/89 138/87 101/71  Pulse:  92 85   Resp:  17 18   Temp: 98.1 F (36.7 C) 97.7 F (36.5 C) 98 F (36.7 C)   TempSrc: Axillary Axillary Oral   SpO2:  94% 94%   Weight:      Height:        Wt Readings from Last 3 Encounters:  05/28/21 103.6 kg  05/05/19 93 kg  10/20/18 95.3 kg     Intake/Output Summary (Last 24 hours) at 05/30/2021 1018 Last data filed at 05/30/2021 0617 Gross per 24 hour  Intake 320 ml  Output 5720 ml  Net -5400 ml     Physical Exam  Aw CV Ativan early this morning currently somnolent, unable to answer questions or follow commands reliably, moves all 4 extremities to painful stimuli  [place].AT,PERRAL Supple Neck, No JVD,   Symmetrical Chest wall movement, Good air movement bilaterally, CTAB RRR,No Gallops, Rubs or new Murmurs,  +ve B.Sounds,  Abd Soft, No tenderness,   No Cyanosis, Clubbing or edema       Data Review:    CBC Recent Labs  Lab 05/27/21 2222 05/27/21 2249 05/28/21 0230 05/28/21 0537 05/28/21 0745 05/29/21 0628 05/30/21 0242  WBC 25.4*  --   --  24.2* 19.4* 17.1* 15.6*  HGB 18.0*   < > 13.6 15.0 13.7 14.8 15.7  HCT 49.3   < > 40.0 41.2  37.9* 41.0 42.2  PLT 557*  --   --  439* 380 382 304  MCV 87.9  --   --  88.6 89.6 88.0 87.6  MCH 32.1  --   --  32.3 32.4 31.8 32.6  MCHC 36.5*  --   --  36.4* 36.1* 36.1* 37.2*  RDW 12.4  --   --  12.6 12.7 12.4 12.4  LYMPHSABS 1.4  --   --   --   --  1.3 1.3  MONOABS 1.1*  --   --   --   --  0.8 0.5  EOSABS 0.0  --   --   --   --  0.0 0.0  BASOSABS 0.1  --   --   --   --  0.0 0.0   < > = values in this interval not displayed.    Electrolytes Recent Labs  Lab 05/27/21 2222 05/27/21 2223 05/27/21 2249 05/28/21 0207 05/28/21 0230 05/28/21 0537 05/28/21 0745 05/29/21 0628 05/29/21 1736 05/30/21 0242  NA 125*  --    < >  --  125* 124* 126* 125* 129* 130*  K 3.5  --    < >  --  4.1 3.6 4.0 3.2*  --  3.7  CL 84*  --   --   --   --  89* 88* 85*  --  94*  CO2 26  --   --   --   --  23 25 29   --  25  GLUCOSE 143*  --   --   --   --  101* 75 91  --  125*  BUN 25*  --   --   --   --  28* 29* 17  --  14  CREATININE 1.23  --   --   --   --  1.11 1.12 0.76  --  0.84  CALCIUM 9.0  --   --   --   --  7.9* 7.6* 8.2*  --  8.6*  AST 209*  --   --   --   --  162*  --  142*   145*  --  110*  ALT 103*  --   --   --   --  81*  --  87*   89*  --  90*  ALKPHOS 64  --   --   --   --  52  --  64   52  --  48  BILITOT 1.5*  --   --   --   --  1.0  --  1.0   1.1  --  0.8  ALBUMIN 3.6  --   --   --   --  2.8*  --  2.9*   2.9*  --  2.8*  MG  --   --   --   --   --  2.0 2.0 2.0  --  2.2  CRP  --   --   --   --   --  6.4*  --  6.2*  --  3.8*  DDIMER  --   --   --   --   --  0.87* 0.92* 1.51*  --  1.11*  PROCALCITON  --   --   --   --   --  9.04  --  0.11  --  <0.10  LATICACIDVEN   --  4.3*  --  2.6*  --   --   --   --   --   --  INR 0.9  --   --   --   --   --   --   --   --   --   TSH  --   --   --   --   --  2.252  --   --   --   --   BNP  --   --   --   --   --   --   --  249.7*  --  352.7*   < > = values in this interval not displayed.    ------------------------------------------------------------------------------------------------------------------ No results for input(s): CHOL, HDL, LDLCALC, TRIG, CHOLHDL, LDLDIRECT in the last 72 hours.  No results found for: HGBA1C  Recent Labs    05/28/21 0537  TSH 2.252   ------------------------------------------------------------------------------------------------------------------ ID Labs Recent Labs  Lab 05/27/21 2222 05/27/21 2223 05/28/21 0207 05/28/21 0537 05/28/21 0745 05/29/21 0628 05/30/21 0242  WBC 25.4*  --   --  24.2* 19.4* 17.1* 15.6*  PLT 557*  --   --  439* 380 382 304  CRP  --   --   --  6.4*  --  6.2* 3.8*  DDIMER  --   --   --  0.87* 0.92* 1.51* 1.11*  PROCALCITON  --   --   --  9.04  --  0.11 <0.10  LATICACIDVEN  --  4.3* 2.6*  --   --   --   --   CREATININE 1.23  --   --  1.11 1.12 0.76 0.84   Cardiac Enzymes No results for input(s): CKMB, TROPONINI, MYOGLOBIN in the last 168 hours.  Invalid input(s): CK      Radiology Reports CT HEAD WO CONTRAST  Result Date: 05/28/2021 CLINICAL DATA:  Trauma. EXAM: CT HEAD WITHOUT CONTRAST CT CERVICAL SPINE WITHOUT CONTRAST TECHNIQUE: Multidetector CT imaging of the head and cervical spine was performed following the standard protocol without intravenous contrast. Multiplanar CT image reconstructions of the cervical spine were also generated. COMPARISON:  None. FINDINGS: CT HEAD FINDINGS Brain: Mild age-related atrophy and chronic microvascular ischemic changes. No acute intracranial hemorrhage. No mass effect or midline shift. No extra-axial fluid collection. Vascular: No hyperdense vessel or unexpected calcification. Skull: Normal. Negative  for fracture or focal lesion. Sinuses/Orbits: Diffuse mucoperiosteal thickening of paranasal sinuses with partial opacification of several ethmoid air cells and left maxillary sinus. There is air-fluid level in the left maxillary sinus. The mastoid air cells are clear. Other: None CT CERVICAL SPINE FINDINGS Alignment: No acute subluxation. Skull base and vertebrae: No acute fracture. Soft tissues and spinal canal: No prevertebral fluid or swelling. No visible canal hematoma. Disc levels: Multilevel degenerative changes with multilevel facet arthropathy. Upper chest: Probable atelectasis. Other: None IMPRESSION: 1. No acute intracranial pathology. Mild age-related atrophy and chronic microvascular ischemic changes. 2. No acute/traumatic cervical spine pathology. Multilevel degenerative changes. 3. Paranasal sinus disease. Electronically Signed   By: Elgie Collard M.D.   On: 05/28/2021 02:10   CT Chest W Contrast  Result Date: 05/28/2021 CLINICAL DATA:  Altered mental status, found down, hypothermic EXAM: CT CHEST WITH CONTRAST TECHNIQUE: Multidetector CT imaging of the chest was performed during intravenous contrast administration. CONTRAST:  ISOVUE-370 IOPAMIDOL (ISOVUE-370) INJECTION 76% COMPARISON:  Chest radiograph dated 05/27/2021 FINDINGS: Cardiovascular: Heart is normal in size.  No pericardial effusion. No evidence of thoracic aortic aneurysm. Mild atherosclerotic calcifications of the aortic arch. Three vessel coronary atherosclerosis. Mediastinum/Nodes: No suspicious mediastinal lymphadenopathy. Visualized thyroid is unremarkable. Lungs/Pleura: Mild  dependent atelectasis in the bilateral upper and lower lobes. Superimposed mild ground-glass opacity in the inferior right middle lobe (series 8/image 103) and bilateral lung bases could reflect mild aspiration, particularly given associated mild endobronchial debris in the right lower lobe bronchi (series 7/image 111). No suspicious pulmonary  nodules. No pleural effusion or pneumothorax. Upper Abdomen: Evaluated on dedicated CTA. Musculoskeletal: Nondisplaced right anterolateral 5th and 6th rib fractures (series 7/images 99 and 125). Thoracic spine is within normal limits. IMPRESSION: Nondisplaced right anterolateral 5th and 6th rib fractures. No pneumothorax is seen. Suspected mild aspiration in the right middle lobe and bilateral lung bases. Aortic Atherosclerosis (ICD10-I70.0). Electronically Signed   By: Charline Bills M.D.   On: 05/28/2021 02:17   CT CERVICAL SPINE WO CONTRAST  Result Date: 05/28/2021 CLINICAL DATA:  Trauma. EXAM: CT HEAD WITHOUT CONTRAST CT CERVICAL SPINE WITHOUT CONTRAST TECHNIQUE: Multidetector CT imaging of the head and cervical spine was performed following the standard protocol without intravenous contrast. Multiplanar CT image reconstructions of the cervical spine were also generated. COMPARISON:  None. FINDINGS: CT HEAD FINDINGS Brain: Mild age-related atrophy and chronic microvascular ischemic changes. No acute intracranial hemorrhage. No mass effect or midline shift. No extra-axial fluid collection. Vascular: No hyperdense vessel or unexpected calcification. Skull: Normal. Negative for fracture or focal lesion. Sinuses/Orbits: Diffuse mucoperiosteal thickening of paranasal sinuses with partial opacification of several ethmoid air cells and left maxillary sinus. There is air-fluid level in the left maxillary sinus. The mastoid air cells are clear. Other: None CT CERVICAL SPINE FINDINGS Alignment: No acute subluxation. Skull base and vertebrae: No acute fracture. Soft tissues and spinal canal: No prevertebral fluid or swelling. No visible canal hematoma. Disc levels: Multilevel degenerative changes with multilevel facet arthropathy. Upper chest: Probable atelectasis. Other: None IMPRESSION: 1. No acute intracranial pathology. Mild age-related atrophy and chronic microvascular ischemic changes. 2. No acute/traumatic  cervical spine pathology. Multilevel degenerative changes. 3. Paranasal sinus disease. Electronically Signed   By: Elgie Collard M.D.   On: 05/28/2021 02:10   CT Angio Aortobifemoral W and/or Wo Contrast  Result Date: 05/28/2021 CLINICAL DATA:  Found down, cyanotic feet EXAM: CT ANGIOGRAPHY OF ABDOMINAL AORTA WITH ILIOFEMORAL RUNOFF TECHNIQUE: Multidetector CT imaging of the abdomen, pelvis and lower extremities was performed using the standard protocol during bolus administration of intravenous contrast. Multiplanar CT image reconstructions and MIPs were obtained to evaluate the vascular anatomy. CONTRAST:  ISOVUE-370 IOPAMIDOL (ISOVUE-370) INJECTION 76% COMPARISON:  None. FINDINGS: VASCULAR Aorta: Patent.  Mild atherosclerotic calcifications. Celiac: Patent.  Atherosclerotic calcifications at the origin. SMA: Patent. Renals: Patent bilaterally. Atherosclerotic calcifications of the origin on the left. IMA: Patent. RIGHT Lower Extremity Inflow: Patent. Outflow: Patent. Runoff: Patent three-vessel runoff. LEFT Lower Extremity Inflow: Patent. Outflow: Patent. Runoff: Patent three-vessel runoff. Veins: Unremarkable. Review of the MIP images confirms the above findings. NON-VASCULAR Lower chest: Evaluated on dedicated CT chest. Hepatobiliary: Liver is within normal limits. Gallbladder is unremarkable. No intrahepatic or extrahepatic duct dilatation. Pancreas: Within normal limits. Spleen: Within normal limits. Adrenals/Urinary Tract: Mild nodular thickening of the left adrenal gland without discrete mass. Bilateral renal cysts, measuring up to 4.2 cm in the left upper kidney (series 7/image 80). No hydronephrosis. Bladder is within normal limits. Stomach/Bowel: Stomach is within normal limits. No evidence of bowel obstruction. Appendix is not discretely visualized. Sigmoid diverticulosis, without evidence of diverticulitis. Lymphatic: No suspicious abdominopelvic lymphadenopathy. Reproductive: Prostate is  unremarkable. Other: No abdominopelvic ascites. Musculoskeletal: Mild degenerative changes of the visualized thoracolumbar spine. IMPRESSION: Patent three-vessel  runoff bilaterally. Additional ancillary findings as above. Electronically Signed   By: Charline Bills M.D.   On: 05/28/2021 02:12   DG Chest Port 1 View  Result Date: 05/30/2021 CLINICAL DATA:  Shortness of breath, COVID positive EXAM: PORTABLE CHEST 1 VIEW COMPARISON:  05/19/2021 FINDINGS: The heart size and mediastinal contours are within normal limits. Bibasilar scarring and or atelectasis, unchanged. The visualized skeletal structures are unremarkable. IMPRESSION: Bibasilar scarring and or atelectasis, unchanged in AP portable projection. No new airspace opacity. Electronically Signed   By: Jearld Lesch M.D.   On: 05/30/2021 08:40   DG Chest Port 1 View  Result Date: 05/29/2021 CLINICAL DATA:  70 year old male with history of shortness of breath. COVID positive patient. EXAM: PORTABLE CHEST 1 VIEW COMPARISON:  Chest x-ray 05/28/2021. FINDINGS: Low lung volumes. Bibasilar opacities which may reflect areas of atelectasis and/or consolidation. Small bilateral pleural effusions. No pneumothorax. No evidence of pulmonary edema. Heart size is normal. Upper mediastinal contours are within normal limits. IMPRESSION: 1. Low lung volumes with bibasilar areas of atelectasis and/or consolidation and small bilateral pleural effusions. Electronically Signed   By: Trudie Reed M.D.   On: 05/29/2021 08:03   DG Chest Port 1 View  Result Date: 05/28/2021 CLINICAL DATA:  70 year old male found down.  Positive COVID-19. EXAM: PORTABLE CHEST 1 VIEW COMPARISON:  CT chest 0102 hours today and earlier. FINDINGS: Portable AP upright view at 0757 hours. Mildly lower lung volumes compared to yesterday. Normal cardiac size and mediastinal contours. No pneumothorax or pleural effusion. Allowing for portable technique the lungs are clear. Visualized tracheal  air column is within normal limits. Right 5th and 6th rib fractures better demonstrated by CT. No new osseous abnormality identified. Negative visible bowel gas. IMPRESSION: 1. No new cardiopulmonary abnormality. 2. Right 5th and 6th rib fractures better demonstrated by CT today. Electronically Signed   By: Odessa Fleming M.D.   On: 05/28/2021 08:09   DG Chest Port 1 View  Result Date: 05/27/2021 CLINICAL DATA:  Altered mental status, hypothermia EXAM: PORTABLE CHEST 1 VIEW COMPARISON:  None. FINDINGS: Heart and mediastinal contours are within normal limits. No focal opacities or effusions. No acute bony abnormality. IMPRESSION: No active cardiopulmonary disease. Electronically Signed   By: Charlett Nose M.D.   On: 05/27/2021 22:41   EEG adult  Result Date: 05/29/2021 Charlsie Quest, MD     05/29/2021  5:30 PM Patient Name: JAVAD SALVA MRN: [id number] Epilepsy Attending: Charlsie Quest Referring Physician/Provider: John Giovanni, MD Date: 05/29/2021 Duration: 25.14 mins Patient history: 70yo m with ams. EEG to evaluate for seizure Level of alertness: Asleep AEDs during EEG study: None Technical aspects: This EEG study was done with scalp electrodes positioned according to the 10-20 International system of electrode placement. Electrical activity was acquired at a sampling rate of 500Hz  and reviewed with a high frequency filter of 70Hz  and a low frequency filter of 1Hz . EEG data were recorded continuously and digitally stored. Description: Sleep was characterized by vertex waves, sleep spindles (12 to 14 Hz), maximal frontocentral region. Hyperventilation and photic stimulation were not performed.   IMPRESSION: This study during sleep only is within normal limits. No seizures or epileptiform discharges were seen throughout the recording. Priyanka 

## 2021-05-30 NOTE — Progress Notes (Signed)
CSW following for potential discharge needs and medical readiness. Current barrier to SNF placement is COVID positive status.   Joaquin Courts, MSW, [facility]

## 2021-05-30 NOTE — Evaluation (Signed)
Occupational Therapy Evaluation Patient Details Name: Fred Graham MRN: [id number] DOB: 1950-10-22 Today's Date: 05/30/2021   History of Present Illness 70 y.o. male with medical history significant of hypertension, alcohol abuse, depression presented to the ED 05/27/21 via EMS after being found down at home at the time of evaluation by law enforcement.  Per EMS, family had not heard from the patient in several days and called for welfare check.Admitted with aspiration pna and COVID 19. CT chest Nondisplaced right anterolateral 5th and 6th rib fractures ETOH. PMH: hypertension, alcohol abuse, depression   Clinical Impression   PTA patient reports independent and driving. Admitted for above and limited by problem list below, including decreased activity tolerance, generalized weakness, pain, impaired balance and impaired cognition. [place] reoriented to place and able to recall; initially reports March 2022, but able to self correct when questioned.  Pt currently requires up to total assist for ADLs, max assist +2 for bed mobility and total assist +2 with attempts to stand (without clearing his bottom from the bed). Anticipate some lethargy/confusion related to medication overnight.  Believe he will benefit from further OT services while admitted and after dc at SNF level at this time.  Will follow.      Recommendations for follow up therapy are one component of a multi-disciplinary discharge planning process, led by the attending physician.  Recommendations may be updated based on patient status, additional functional criteria and insurance authorization.   Follow Up Recommendations  Skilled nursing-short term rehab (<3 hours/day)    Assistance Recommended at Discharge Frequent or constant Supervision/Assistance  Functional Status Assessment  Patient has had a recent decline in their functional status and demonstrates the ability to make significant improvements in  function in a reasonable and predictable amount of time.  Equipment Recommendations  BSC/3in1    Recommendations for Other Services       Precautions / Restrictions Precautions Precautions: Fall Precaution Comments: found down at home Restrictions Weight Bearing Restrictions: No      Mobility Bed Mobility Overal bed mobility: Needs Assistance Bed Mobility: Rolling;Sidelying to Sit;Sit to Sidelying Rolling: Total assist Sidelying to sit: Max assist;+2 for physical assistance     Sit to sidelying: +2 for physical assistance;Total assist General bed mobility comments: pt assisted with moving legs toward EOB and pushed some with UE to help with raising torso; total assist back to bed    Transfers Overall transfer level: Needs assistance Equipment used: Rolling walker (2 wheels) Transfers: Sit to/from Stand Sit to Stand: Total assist;+2 physical assistance           General transfer comment: unable to lift hips off bed, even with use of pad under his hips      Balance Overall balance assessment: Needs assistance Sitting-balance support: Feet supported;Bilateral upper extremity supported Sitting balance-Leahy Scale: Poor Sitting balance - Comments: requires bilateral UE support; tending to lean forward                                   ADL either performed or assessed with clinical judgement   ADL Overall ADL's : Needs assistance/impaired     Grooming: Minimal assistance;Bed level Grooming Details (indicate cue type and reason): min assist to initate and susatin attention to task         Upper Body Dressing : Maximal assistance;Sitting   Lower Body Dressing: Total assistance;+2 for physical assistance;+2 for  safety/equipment;Bed level     Toilet Transfer Details (indicate cue type and reason): deferred                 Vision   Additional Comments: cueing to open eyes during session- antiicpate due to lethargy. continue assessment      Perception     Praxis      Pertinent Vitals/Pain Pain Assessment: Faces Faces Pain Scale: Hurts whole lot Pain Location: legs; ?hip Pain Descriptors / Indicators: Grimacing;Moaning Pain Intervention(s): Limited activity within patient's tolerance;Monitored during session;Repositioned     Hand Dominance Right   Extremity/Trunk Assessment Upper Extremity Assessment Upper Extremity Assessment: Generalized weakness   Lower Extremity Assessment Lower Extremity Assessment: Defer to PT evaluation   Cervical / Trunk Assessment Cervical / Trunk Assessment: Kyphotic   Communication Communication Communication: Other (comment) (mumbled speech)   Cognition Arousal/Alertness: Lethargic;Suspect due to medications Behavior During Therapy: Flat affect Overall Cognitive Status: No family/caregiver present to determine baseline cognitive functioning                                 General Comments: oriented to self, year, month with cues; able to recall hospital after >5 minutes; slowly follows simple commands; poor awareness and requires constant redirection to task     General Comments  on 2L, gradual decreased to 87% on RA and replaced 2L    Exercises     Shoulder Instructions      Home Living Family/patient expects to be discharged to:: Private residence Living Arrangements: Alone   Type of Home: Mobile home Home Access: Stairs to enter Secretary/administrator of Steps: 1   Home Layout: One level     Bathroom Shower/Tub: Theme park manager: Yes   Home Equipment: BSC/3in1   Additional Comments: per PT eval      Prior Functioning/Environment Prior Level of Function : Patient poor historian/Family not available             Mobility Comments: reports independence with mobility ADLs Comments: reports independence with ADLs, and driving        OT Problem List: Decreased strength;Decreased activity  tolerance;Impaired balance (sitting and/or standing);Decreased cognition;Decreased safety awareness;Decreased knowledge of use of DME or AE;Decreased knowledge of precautions;Cardiopulmonary status limiting activity      OT Treatment/Interventions: Self-care/ADL training;Therapeutic exercise;DME and/or AE instruction;Energy conservation;Therapeutic activities;Cognitive remediation/compensation;Patient/family education;Balance training    OT Goals(Current goals can be found in the care plan section) Acute Rehab OT Goals Patient Stated Goal: to lay back down OT Goal Formulation: With patient Time For Goal Achievement: 06/13/21 Potential to Achieve Goals: Fair  OT Frequency: Min 2X/week   Barriers to D/C:            Co-evaluation PT/OT/SLP Co-Evaluation/Treatment: Yes Reason for Co-Treatment: Necessary to address cognition/behavior during functional activity;For patient/therapist safety;To address functional/ADL transfers PT goals addressed during session: Mobility/safety with mobility;Balance OT goals addressed during session: ADL's and self-care      AM-PAC OT "6 Clicks" Daily Activity     Outcome Measure Help from another person eating meals?: A Lot Help from another person taking care of personal grooming?: A Lot Help from another person toileting, which includes using toliet, bedpan, or urinal?: Total Help from another person bathing (including washing, rinsing, drying)?: A Lot Help from another person to put on and taking off regular upper body clothing?: A Lot Help from another person to put on  and taking off regular lower body clothing?: Total 6 Click Score: 10   End of Session Equipment Utilized During Treatment: Rolling walker (2 wheels);Oxygen Nurse Communication: Mobility status  Activity Tolerance: Patient limited by lethargy Patient left: in bed;with call bell/phone within reach;with bed alarm set;with restraints reapplied  OT Visit Diagnosis: Other abnormalities of  gait and mobility (R26.89);Muscle weakness (generalized) (M62.81);Other symptoms and signs involving cognitive function                Time: 4081-4481 OT Time Calculation (min): 31 min Charges:  OT General Charges $OT Visit: 1 Visit OT Evaluation $OT Eval Moderate Complexity: 1 Mod  Barry Brunner, OT Acute Rehabilitation Services Pager [phone] Office [phone]   DORTHY HUSTEAD 05/30/2021, 2:07 PM

## 2021-05-30 NOTE — Plan of Care (Signed)
CIWA protocol being implemented, family at bedside to assist in keeping patient safe.  Problem: Education: Goal: Knowledge of General Education information will improve Description: Including pain rating scale, medication(s)/side effects and non-pharmacologic comfort measures Outcome: Progressing   Problem: Health Behavior/Discharge Planning: Goal: Ability to manage health-related needs will improve Outcome: Progressing   Problem: Clinical Measurements: Goal: Ability to maintain clinical measurements within normal limits will improve Outcome: Progressing Goal: Will remain free from infection Outcome: Progressing Goal: Diagnostic test results will improve Outcome: Progressing Goal: Respiratory complications will improve Outcome: Progressing Goal: Cardiovascular complication will be avoided Outcome: Progressing

## 2021-05-30 NOTE — Progress Notes (Signed)
°  Echocardiogram 2D Echocardiogram has been performed.  Augustine Radar 05/30/2021, 2:56 PM

## 2021-05-30 NOTE — Progress Notes (Signed)
Physical Therapy Treatment Patient Details Name: Fred Graham MRN: [id number] DOB: 15-Nov-1950 Today's Date: 05/30/2021   History of Present Illness 70 y.o. male with medical history significant of hypertension, alcohol abuse, depression presented to the ED 05/27/21 via EMS after being found down at home at the time of evaluation by law enforcement.  Per EMS, family had not heard from the patient in several days and called for welfare check.Admitted with aspiration pna and COVID 19. CT chest Nondisplaced right anterolateral 5th and 6th rib fractures ETOH. PMH: hypertension, alcohol abuse, depression    PT Comments    Patient given sedating med overnight per RN, therefore waited until pm to work with pt. Patient with eyes open and talking to himself on arrival. Pt mumbling and at times difficult to understand, but would repeat more clearly when asked. Disoriented (thought he was at the airport) but cooperative with PT/OT. Lethargy, weakness, and pain in legs limiting pt's ability to stand (unable to clear hips off bed with +2 total assist). Will need SNF unless he dramatically improves as cognition improves.     Recommendations for follow up therapy are one component of a multi-disciplinary discharge planning process, led by the attending physician.  Recommendations may be updated based on patient status, additional functional criteria and insurance authorization.  Follow Up Recommendations  Skilled nursing-short term rehab (<3 hours/day)     Assistance Recommended at Discharge Frequent or constant Supervision/Assistance  Equipment Recommendations   (TBD)    Recommendations for Other Services       Precautions / Restrictions Precautions Precautions: Fall Precaution Comments: found down at home     Mobility  Bed Mobility Overal bed mobility: Needs Assistance Bed Mobility: Rolling;Sidelying to Sit;Sit to Sidelying Rolling: Total assist Sidelying to sit: Max assist;+2 for physical  assistance     Sit to sidelying: +2 for physical assistance;Total assist General bed mobility comments: pt assisted with moving legs toward EOB and pushed some with UE to help with raising torso; total assist back to bed    Transfers Overall transfer level: Needs assistance Equipment used: Rolling walker (2 wheels) Transfers: Sit to/from Stand Sit to Stand: Total assist;+2 physical assistance           General transfer comment: unable to lift hips off bed, even with use of pad under his hips    Ambulation/Gait                   Stairs             Wheelchair Mobility    Modified Rankin (Stroke Patients Only)       Balance Overall balance assessment: Needs assistance Sitting-balance support: Feet supported;Bilateral upper extremity supported Sitting balance-Leahy Scale: Poor Sitting balance - Comments: requires bilateral UE support; tending to lean forward                                    Cognition Arousal/Alertness: Lethargic;Suspect due to medications Behavior During Therapy: Flat affect Overall Cognitive Status: No family/caregiver present to determine baseline cognitive functioning                                 General Comments: oriented to self, year, month with cues; able to recall hospital after >5 minutes; slowly follows simiple commands Functional Status Assessment: Patient has had a recent decline in their  functional status and/or demonstrates limited ability to make significant improvements in function in a reasonable and predictable amount of time      Exercises      General Comments General comments (skin integrity, edema, etc.): on 2L on arrival with sats 93%; placed on room air with gradual decrease to 87% with 2L resumed      Pertinent Vitals/Pain Pain Assessment: Faces Faces Pain Scale: Hurts whole lot Pain Location: legs; ?hip Pain Descriptors / Indicators: Grimacing;Moaning Pain  Intervention(s): Limited activity within patient's tolerance;Monitored during session;Repositioned    Home Living                          Prior Function            PT Goals (current goals can now be found in the care plan section) Acute Rehab PT Goals Patient Stated Goal: go home to cat Time For Goal Achievement: 06/11/21 Potential to Achieve Goals: Fair Progress towards PT goals: Not progressing toward goals - comment (lethargic from meds)    Frequency    Min 2X/week      PT Plan Current plan remains appropriate    Co-evaluation PT/OT/SLP Co-Evaluation/Treatment: Yes Reason for Co-Treatment: Necessary to address cognition/behavior during functional activity;For patient/therapist safety;To address functional/ADL transfers PT goals addressed during session: Mobility/safety with mobility;Balance        AM-PAC PT "6 Clicks" Mobility   Outcome Measure  Help needed turning from your back to your side while in a flat bed without using bedrails?: Total Help needed moving from lying on your back to sitting on the side of a flat bed without using bedrails?: Total Help needed moving to and from a bed to a chair (including a wheelchair)?: Total Help needed standing up from a chair using your arms (e.g., wheelchair or bedside chair)?: Total Help needed to walk in hospital room?: Total Help needed climbing 3-5 steps with a railing? : Total 6 Click Score: 6    End of Session   Activity Tolerance: Patient limited by pain;Patient limited by lethargy Patient left: in bed;with call bell/phone within reach;with bed alarm set Nurse Communication: Mobility status PT Visit Diagnosis: Other abnormalities of gait and mobility (R26.89);Repeated falls (R29.6);Muscle weakness (generalized) (M62.81);History of falling (Z91.81);Difficulty in walking, not elsewhere classified (R26.2);Pain Pain - Right/Left:  (bilateral hips R>L) Pain - part of body: Hip     Time: 1228-1259 PT  Time Calculation (min) (ACUTE ONLY): 31 min  Charges:  $Therapeutic Activity: 8-22 mins                      Fred Graham, PT Acute Rehabilitation Services  Pager [phone] Office [phone]    Zena Amos 05/30/2021, 1:16 PM

## 2021-05-30 NOTE — Evaluation (Signed)
Clinical/Bedside Swallow Evaluation Patient Details  Name: Fred Graham MRN: [id number] Date of Birth: 1950-11-21  Today's Date: 05/30/2021 Time: SLP Start Time (ACUTE ONLY): 1008 SLP Stop Time (ACUTE ONLY): 1045 SLP Time Calculation (min) (ACUTE ONLY): 37 min  Past Medical History:  Past Medical History:  Diagnosis Date   Allergy    Arthritis    hands, hips   Hypertension    Past Surgical History: History reviewed. No pertinent surgical history. HPI:  70yo male admitted 05/27/21 after being found down at home. Per EMS, family had not heard from the patient in several days and called for welfare check. PMH: HTN, alcohol abuse, depression  Admitted with aspiration pna and COVID 19.    Assessment / Plan / Recommendation  Clinical Impression  New order for reassessment of swallow function received. Pt was resting in bed, a little sleepy today. Speech was dysarthric and difficult to understand, with frequent need of repetition. Pt presents with generalized oral motor weakness. Adequate natural dentition. Pt accepted trials of ice chips, thin liquid, nectar thick liquid, puree, and solid textures. Inconsistent cough response following thin liquid boluses was noted. Extended oral prep and oral residue noted on solid consistencies. Nectar thick liquid and puree tolerated well without obvious oral issues or overt s/s aspiration. Pt's diet had been downgraded to dys 3 (soft) prior to this evaluation. Will continue dys 3 solids, and downgrade liquids as well to nectar thick liquid. SLP will continue to follow to assess diet tolerance, and determine readiness to advance or complete instrumental assessment.  SLP Visit Diagnosis: Dysphagia, unspecified (R13.10)    Aspiration Risk  Mild aspiration risk    Diet Recommendation Dysphagia 3 (Mech soft);Nectar-thick liquid   Medication Administration: Crushed with puree Supervision: Full supervision/cueing for compensatory strategies;Staff to assist  with self feeding Compensations: Slow rate;Small sips/bites;Minimize environmental distractions Postural Changes: Seated upright at 90 degrees    Other  Recommendations Oral Care Recommendations: Oral care BID Other Recommendations: Order thickener from pharmacy    Recommendations for follow up therapy are one component of a multi-disciplinary discharge planning process, led by the attending physician.  Recommendations may be updated based on patient status, additional functional criteria and insurance authorization.  Follow up Recommendations  (TBD)      Assistance Recommended at Discharge Frequent or constant Supervision/Assistance  Functional Status Assessment Patient has had a recent decline in their functional status and/or demonstrates limited ability to make significant improvements in function in a reasonable and predictable amount of time  Frequency and Duration min 1 x/week  1 week;2 weeks       Prognosis Prognosis for Safe Diet Advancement: Good      Swallow Study   General Date of Onset: 05/27/21 HPI: 70yo male admitted 05/27/21 after being found down at home. Per EMS, family had not heard from the patient in several days and called for welfare check. PMH: HTN, alcohol abuse, depression  Admitted with aspiration pna and COVID 19. Type of Study: Bedside Swallow Evaluation Previous Swallow Assessment: BSE 05/28/21 - reg/thin Diet Prior to this Study: Dysphagia 3 (soft);Thin liquids Temperature Spikes Noted: No Respiratory Status: Room air History of Recent Intubation: No Behavior/Cognition: Lethargic/Drowsy;Requires cueing Oral Cavity Assessment: Within Functional Limits Oral Care Completed by SLP: No Oral Cavity - Dentition: Adequate natural dentition Vision: Functional for self-feeding Self-Feeding Abilities: Total assist (bilateral mitts in place) Patient Positioning: Upright in bed Baseline Vocal Quality: Normal Volitional Cough: Strong Volitional Swallow: Able  to elicit    Oral/Motor/Sensory  Function Overall Oral Motor/Sensory Function: Generalized oral weakness   Ice Chips Ice chips: Within functional limits Presentation: Spoon   Thin Liquid Thin Liquid: Impaired Presentation: Straw Pharyngeal  Phase Impairments: Cough - Immediate    Nectar Thick Nectar Thick Liquid: Within functional limits Presentation: Straw   Honey Thick Honey Thick Liquid: Not tested   Puree Puree: Within functional limits Presentation: Spoon   Solid     Solid: Impaired Oral Phase Functional Implications: Impaired mastication;Oral residue (extended oral prep)     Fred Graham, [facility], CCC-SLP Speech Language Pathologist Office: [phone]  Leigh Aurora 05/30/2021,10:58 AM

## 2021-05-31 DIAGNOSIS — R6520 Severe sepsis without septic shock: Secondary | ICD-10-CM

## 2021-05-31 DIAGNOSIS — J1282 Pneumonia due to coronavirus disease 2019: Secondary | ICD-10-CM

## 2021-05-31 DIAGNOSIS — A419 Sepsis, unspecified organism: Secondary | ICD-10-CM

## 2021-05-31 DIAGNOSIS — U071 COVID-19: Secondary | ICD-10-CM

## 2021-05-31 DIAGNOSIS — I509 Heart failure, unspecified: Secondary | ICD-10-CM

## 2021-05-31 LAB — CBC WITH DIFFERENTIAL/PLATELET
Abs Immature Granulocytes: 0.16 10*3/uL — ABNORMAL HIGH (ref 0.00–0.07)
Basophils Absolute: 0 10*3/uL (ref 0.0–0.1)
Basophils Relative: 0 %
Eosinophils Absolute: 0 10*3/uL (ref 0.0–0.5)
Eosinophils Relative: 0 %
HCT: 41.8 % (ref 39.0–52.0)
Hemoglobin: 15 g/dL (ref 13.0–17.0)
Immature Granulocytes: 1 %
Lymphocytes Relative: 9 %
Lymphs Abs: 1.5 10*3/uL (ref 0.7–4.0)
MCH: 31.9 pg (ref 26.0–34.0)
MCHC: 35.9 g/dL (ref 30.0–36.0)
MCV: 88.9 fL (ref 80.0–100.0)
Monocytes Absolute: 0.9 10*3/uL (ref 0.1–1.0)
Monocytes Relative: 5 %
Neutro Abs: 14.9 10*3/uL — ABNORMAL HIGH (ref 1.7–7.7)
Neutrophils Relative %: 85 %
Platelets: 400 10*3/uL (ref 150–400)
RBC: 4.7 MIL/uL (ref 4.22–5.81)
RDW: 12.5 % (ref 11.5–15.5)
WBC: 17.5 10*3/uL — ABNORMAL HIGH (ref 4.0–10.5)
nRBC: 0 % (ref 0.0–0.2)

## 2021-05-31 LAB — MAGNESIUM: Magnesium: 2.1 mg/dL (ref 1.7–2.4)

## 2021-05-31 LAB — CULTURE, BLOOD (ROUTINE X 2)
Special Requests: ADEQUATE
Special Requests: ADEQUATE

## 2021-05-31 LAB — COMPREHENSIVE METABOLIC PANEL
ALT: 81 U/L — ABNORMAL HIGH (ref 0–44)
AST: 74 U/L — ABNORMAL HIGH (ref 15–41)
Albumin: 2.9 g/dL — ABNORMAL LOW (ref 3.5–5.0)
Alkaline Phosphatase: 53 U/L (ref 38–126)
Anion gap: 9 (ref 5–15)
BUN: 17 mg/dL (ref 8–23)
CO2: 27 mmol/L (ref 22–32)
Calcium: 8.7 mg/dL — ABNORMAL LOW (ref 8.9–10.3)
Chloride: 94 mmol/L — ABNORMAL LOW (ref 98–111)
Creatinine, Ser: 0.73 mg/dL (ref 0.61–1.24)
GFR, Estimated: >60 mL/min (ref >60)
Glucose, Bld: 121 mg/dL — ABNORMAL HIGH (ref 70–99)
Potassium: 3.5 mmol/L (ref 3.5–5.1)
Sodium: 130 mmol/L — ABNORMAL LOW (ref 135–145)
Total Bilirubin: 0.9 mg/dL (ref 0.3–1.2)
Total Protein: 5.7 g/dL — ABNORMAL LOW (ref 6.5–8.1)

## 2021-05-31 LAB — D-DIMER, QUANTITATIVE: D-Dimer, Quant: 1.16 ug/mL-FEU — ABNORMAL HIGH (ref 0.00–0.50)

## 2021-05-31 LAB — C-REACTIVE PROTEIN: CRP: 1.9 mg/dL — ABNORMAL HIGH (ref <1.0)

## 2021-05-31 LAB — PROCALCITONIN: Procalcitonin: <0.1 ng/mL

## 2021-05-31 LAB — BRAIN NATRIURETIC PEPTIDE: B Natriuretic Peptide: 177.9 pg/mL — ABNORMAL HIGH (ref 0.0–100.0)

## 2021-05-31 MED ORDER — SODIUM CHLORIDE 0.9 % IV SOLN: INTRAVENOUS | Status: DC | PRN

## 2021-05-31 MED ORDER — LISINOPRIL 10 MG PO TABS
10.0000 mg | ORAL_TABLET | Freq: Every day | ORAL | Status: DC
  Administered 2021-05-31 – 2021-06-01 (×2): 10 mg via ORAL
  Filled 2021-05-31 (×2): qty 1

## 2021-05-31 NOTE — Progress Notes (Signed)
Patient's daughter [name] is the POA for patient.  Per her report only her Lezlie Octave, [address] Grant Ruts and Haaris Metallo are allowed up to see the patient.

## 2021-05-31 NOTE — Progress Notes (Signed)
SLP Cancellation Note  Patient Details Name: Fred Graham MRN: [id number] DOB: 08/09/1950   Cancelled treatment:       Reason Eval/Treat Not Completed: Fatigue/lethargy limiting ability to participate (Pt's case was discussed with Dorathy Daft, RN who reported that the pt was given Ativan due to his being combative, and that he has not been alert enough today to have anything p.o. SLP will follow up on subsequent date.)  Fred Belshe [name], Fred Graham, Fred Graham  [name] 05/31/2021, 12:03 PM

## 2021-05-31 NOTE — Progress Notes (Signed)
PROGRESS NOTE                                                                                                                                                                                                             Patient Demographics:    Fred Graham, is a 70 y.o. male, DOB - [birth year], [id number]  Outpatient Primary MD for the patient is Maximiano Coss, NP    LOS - 3  Admit date - 05/27/2021    Chief Complaint  Patient presents with   Altered Mental Status       Brief Narrative (HPI from H&P)    Fred Graham is a 70 y.o. male with medical history significant of hypertension, alcohol abuse, depression presented to the ED via EMS after being found down at home at the time of evaluation by law enforcement.  Per EMS, family had not heard from the patient in several days and called for welfare check.  Police found the patient down on the floor in his underwear, altered, very cold to touch, and did not have heating on at home, was brought to the hospital with confusion, hypothermia, sepsis due to pneumonia and COVID infection.  He was admitted for further care.   Subjective:   No significant events overnight as discussed with staff, he appears to be sedated this morning as he received Ativan around 7 am, unable to provide any complaints.   Assessment  & Plan :    Severe sepsis due to possible aspiration pneumonia in the setting of COVID-19 infection in a patient who is vaccinated with 2 shots of COVID-vaccine - he is currently on empiric antibiotics which have been titrated down, responding well, also on trial of steroids and remdesivir.  He was treated with vancomycin, cefepime and Flagyl, transitioned to Levaquin and Flagyl,  inflammatory markers are borderline.  Speech to evaluate.  Has been adequately hydrated with IV fluids, sepsis pathophysiology has improved.  Monitor cultures closely, prelim cultures few minutes  ago show 3 out of 4 staph epidermis which could be a contamination, leukocytosis has improved and procalcitonin has trended to normal after empiric Levaquin and Flagyl treatment for aspiration pneumonia.   Encouraged the patient to sit up in chair in the daytime use I-S and flutter valve for pulmonary toiletry.  Will advance activity and titrate down oxygen as  possible.  3/4 blood culture growing Epidermidis -Procalcitonin is reassuring, will follow on repeat blood cultures.  2D echo with no evidence of vegetation.  Acute on chronic hyponatremia.  Worse after hydration electrolytes suggest SIADH, Samsca on 05/29/2021 with improvement repeated dose on 05/30/2021.  Rhabdomyolysis.  Hydrated.  Metobolic encephalopathy.  Improving with supportive care, no focal deficits head CT stable.  Hypothermia.  Due to combination of sepsis and exposure to elements.  Resolved after supportive care for aspiration pneumonia.  Deconditioning and fall.  PT OT.  Hydrated.  Hypertension.  Norvasc and Coreg.  Mildly elevated transaminases.  Asymptomatic due to rhabdo.  Trend.  Alcohol abuse with DTs .  Patient is lethargic this morning,.  On CIWA protocol, along with Librium.  Gracy Racer has been stopped given his lethargy, continue only with as needed Ativan.  Mildly elevated troponin. /2D echo with global hypokinesis and EF of 45% -Given above findings he was placed on aspirin, statin, and beta-blockers, will request cardiology input.   Hypokalemia - replaced.       Condition - Extremely Guarded  Family Communication  : none at bedside  Code Status :  Full  Consults  :  None  PUD Prophylaxis : Pepcid   Procedures  :     TTE -   EEG - non acute  CT head and C-spine.  Nonacute.  CTA - Patent three-vessel runoff bilaterally. Additional ancillary findings as above  CT chest -  Nondisplaced right anterolateral 5th and 6th rib fractures. No pneumothorax is seen. Suspected mild aspiration in the  right middle lobe and bilateral lung bases. Aortic Atherosclerosis (ICD10-I70.0).       Disposition Plan  :    Status is: Inpatient  Remains inpatient appropriate because: Sepsis due to pneumonia.  DVT Prophylaxis  :    enoxaparin (LOVENOX) injection 40 mg Start: 05/28/21 1600     Lab Results  Component Value Date   PLT 400 05/31/2021    Diet :  Diet Order             DIET SOFT Room service appropriate? Yes; Fluid consistency: Nectar Thick  Diet effective now                    Inpatient Medications  Scheduled Meds:  amLODipine  10 mg Oral Daily   aspirin  81 mg Oral Daily   carvedilol  3.125 mg Oral BID WC   chlordiazePOXIDE  5 mg Oral TID   chlorhexidine  15 mL Mouth Rinse BID   dexamethasone (DECADRON) injection  6 mg Intravenous Q24H   enoxaparin (LOVENOX) injection  40 mg Subcutaneous [id number]   folic acid  1 mg Oral Daily   levofloxacin  750 mg Oral Daily   mouth rinse  15 mL Mouth Rinse q12n4p   metroNIDAZOLE  500 mg Oral Q8H   multivitamin with minerals  1 tablet Oral Daily   rosuvastatin  10 mg Oral Daily   thiamine  100 mg Oral Daily   Or   thiamine  100 mg Intravenous Daily   Continuous Infusions:  sodium chloride     remdesivir 100 mg in NS 100 mL 100 mg (05/31/21 1026)   PRN Meds:.sodium chloride, lip balm, loperamide, [DISCONTINUED] LORazepam **OR** LORazepam  Antibiotics  :    Anti-infectives (From admission, onward)    Start     Dose/Rate Route Frequency Ordered Stop   05/29/21 1000  remdesivir 100 mg in sodium chloride 0.9 % 100 mL IVPB  See Hyperspace for full Linked Orders Report.   100 mg 200 mL/hr over 30 Minutes Intravenous Daily 05/28/21 0530 06/02/21 0959   05/29/21 0200  vancomycin (VANCOREADY) IVPB 2000 mg/400 mL  Status:  Discontinued        2,000 mg 200 mL/hr over 120 Minutes Intravenous Every 24 hours 05/28/21 0538 05/28/21 0942   05/28/21 1100  levofloxacin (LEVAQUIN) tablet 750 mg        750 mg Oral Daily 05/28/21  0943 06/01/21 0959   05/28/21 1100  metroNIDAZOLE (FLAGYL) tablet 500 mg        500 mg Oral Every 8 hours 05/28/21 0943     05/28/21 0800  metroNIDAZOLE (FLAGYL) IVPB 500 mg  Status:  Discontinued        500 mg 100 mL/hr over 60 Minutes Intravenous Every 8 hours 05/28/21 0523 05/28/21 0942   05/28/21 0800  ceFEPIme (MAXIPIME) 2 g in sodium chloride 0.9 % 100 mL IVPB  Status:  Discontinued        2 g 200 mL/hr over 30 Minutes Intravenous Every 8 hours 05/28/21 0538 05/28/21 0943   05/28/21 0530  remdesivir 200 mg in sodium chloride 0.9% 250 mL IVPB       See Hyperspace for full Linked Orders Report.   200 mg 580 mL/hr over 30 Minutes Intravenous Once 05/28/21 0530 05/28/21 0754   05/27/21 2330  ceFEPIme (MAXIPIME) 2 g in sodium chloride 0.9 % 100 mL IVPB        2 g 200 mL/hr over 30 Minutes Intravenous  Once 05/27/21 2315 05/28/21 0205   05/27/21 2330  metroNIDAZOLE (FLAGYL) IVPB 500 mg        500 mg 100 mL/hr over 60 Minutes Intravenous  Once 05/27/21 2315 05/28/21 0205   05/27/21 2330  vancomycin (VANCOREADY) IVPB 2000 mg/400 mL        2,000 mg 200 mL/hr over 120 Minutes Intravenous  Once 05/27/21 2315 05/28/21 0422          Cherina Dhillon M.D on 05/31/2021 at 1:14 PM  To page go to [email or web address]   [facility] -  Office  [phone]  See all Orders from today for further details    Objective:   Vitals:   05/30/21 2332 05/31/21 0442 05/31/21 0739 05/31/21 1207  BP: (!) 148/90 (!) 152/86 (!) 152/96 140/85  Pulse: 99 (!) 105 93 86  Resp: 20 20 18 18   Temp: 98.3 F (36.8 C) (!) 97.5 F (36.4 C) 98.1 F (36.7 C) 97.7 F (36.5 C)  TempSrc: Axillary Oral Axillary Axillary  SpO2: 92% 92% 93% 96%  Weight:      Height:        Wt Readings from Last 3 Encounters:  05/28/21 103.6 kg  05/05/19 93 kg  10/20/18 95.3 kg     Intake/Output Summary (Last 24 hours) at 05/31/2021 1314 Last data filed at 05/31/2021 1302 Gross per 24 hour  Intake 320 ml  Output  1485 ml  Net -1165 ml     Physical Exam  Lethargic, mumbles few words, but unable to answer any questions or follow any commands, frail and chronically ill-appearing. Symmetrical Chest wall movement, fair air entry bilaterally, minimally diminished at the bases RRR,No Gallops,Rubs or new Murmurs, No Parasternal Heave +ve B.Sounds, Abd Soft, No tenderness, No rebound - guarding or rigidity. No Cyanosis, Clubbing or edema, No new Rash or bruise         Data Review:    CBC Recent Labs  Lab  05/27/21 2222 05/27/21 2249 05/28/21 0537 05/28/21 0745 05/29/21 0628 05/30/21 0242 05/31/21 0225  WBC 25.4*  --  24.2* 19.4* 17.1* 15.6* 17.5*  HGB 18.0*   < > 15.0 13.7 14.8 15.7 15.0  HCT 49.3   < > 41.2 37.9* 41.0 42.2 41.8  PLT 557*  --  439* 380 382 304 400  MCV 87.9  --  88.6 89.6 88.0 87.6 88.9  MCH 32.1  --  32.3 32.4 31.8 32.6 31.9  MCHC 36.5*  --  36.4* 36.1* 36.1* 37.2* 35.9  RDW 12.4  --  12.6 12.7 12.4 12.4 12.5  LYMPHSABS 1.4  --   --   --  1.3 1.3 1.5  MONOABS 1.1*  --   --   --  0.8 0.5 0.9  EOSABS 0.0  --   --   --  0.0 0.0 0.0  BASOSABS 0.1  --   --   --  0.0 0.0 0.0   < > = values in this interval not displayed.    Electrolytes Recent Labs  Lab 05/27/21 2222 05/27/21 2223 05/27/21 2249 05/28/21 0207 05/28/21 0230 05/28/21 0537 05/28/21 0745 05/29/21 0628 05/29/21 1736 05/30/21 0242 05/31/21 0225  NA 125*  --    < >  --    < > 124* 126* 125* 129* 130* 130*  K 3.5  --    < >  --    < > 3.6 4.0 3.2*  --  3.7 3.5  CL 84*  --   --   --   --  89* 88* 85*  --  94* 94*  CO2 26  --   --   --   --  23 25 29   --  25 27  GLUCOSE 143*  --   --   --   --  101* 75 91  --  125* 121*  BUN 25*  --   --   --   --  28* 29* 17  --  14 17  CREATININE 1.23  --   --   --   --  1.11 1.12 0.76  --  0.84 0.73  CALCIUM 9.0  --   --   --   --  7.9* 7.6* 8.2*  --  8.6* 8.7*  AST 209*  --   --   --   --  162*  --  142*   145*  --  110* 74*  ALT 103*  --   --   --   --  81*  --   87*   89*  --  90* 81*  ALKPHOS 64  --   --   --   --  52  --  64   52  --  48 53  BILITOT 1.5*  --   --   --   --  1.0  --  1.0   1.1  --  0.8 0.9  ALBUMIN 3.6  --   --   --   --  2.8*  --  2.9*   2.9*  --  2.8* 2.9*  MG  --   --   --   --   --  2.0 2.0 2.0  --  2.2 2.1  CRP  --   --   --   --   --  6.4*  --  6.2*  --  3.8* 1.9*  DDIMER  --   --   --   --   --  0.87* 0.92* 1.51*  --  1.11* 1.16*  PROCALCITON  --   --   --   --   --  9.04  --  0.11  --  <0.10 <0.10  LATICACIDVEN  --  4.3*  --  2.6*  --   --   --   --   --   --   --   INR 0.9  --   --   --   --   --   --   --   --   --   --   TSH  --   --   --   --   --  2.252  --   --   --   --   --   BNP  --   --   --   --   --   --   --  249.7*  --  352.7* 177.9*   < > = values in this interval not displayed.    ------------------------------------------------------------------------------------------------------------------ No results for input(s): CHOL, HDL, LDLCALC, TRIG, CHOLHDL, LDLDIRECT in the last 72 hours.  No results found for: HGBA1C  No results for input(s): TSH, T4TOTAL, T3FREE, THYROIDAB in the last 72 hours.  Invalid input(s): FREET3  ------------------------------------------------------------------------------------------------------------------ ID Labs Recent Labs  Lab 05/27/21 2223 05/28/21 0207 05/28/21 0537 05/28/21 0745 05/29/21 0628 05/30/21 0242 05/31/21 0225  WBC  --   --  24.2* 19.4* 17.1* 15.6* 17.5*  PLT  --   --  439* 380 382 304 400  CRP  --   --  6.4*  --  6.2* 3.8* 1.9*  DDIMER  --   --  0.87* 0.92* 1.51* 1.11* 1.16*  PROCALCITON  --   --  9.04  --  0.11 <0.10 <0.10  LATICACIDVEN 4.3* 2.6*  --   --   --   --   --   CREATININE  --   --  1.11 1.12 0.76 0.84 0.73   Cardiac Enzymes No results for input(s): CKMB, TROPONINI, MYOGLOBIN in the last 168 hours.  Invalid input(s): CK      Radiology Reports CT HEAD WO CONTRAST  Result Date: 05/28/2021 CLINICAL DATA:  Trauma. EXAM: CT HEAD  WITHOUT CONTRAST CT CERVICAL SPINE WITHOUT CONTRAST TECHNIQUE: Multidetector CT imaging of the head and cervical spine was performed following the standard protocol without intravenous contrast. Multiplanar CT image reconstructions of the cervical spine were also generated. COMPARISON:  None. FINDINGS: CT HEAD FINDINGS Brain: Mild age-related atrophy and chronic microvascular ischemic changes. No acute intracranial hemorrhage. No mass effect or midline shift. No extra-axial fluid collection. Vascular: No hyperdense vessel or unexpected calcification. Skull: Normal. Negative for fracture or focal lesion. Sinuses/Orbits: Diffuse mucoperiosteal thickening of paranasal sinuses with partial opacification of several ethmoid air cells and left maxillary sinus. There is air-fluid level in the left maxillary sinus. The mastoid air cells are clear. Other: None CT CERVICAL SPINE FINDINGS Alignment: No acute subluxation. Skull base and vertebrae: No acute fracture. Soft tissues and spinal canal: No prevertebral fluid or swelling. No visible canal hematoma. Disc levels: Multilevel degenerative changes with multilevel facet arthropathy. Upper chest: Probable atelectasis. Other: None IMPRESSION: 1. No acute intracranial pathology. Mild age-related atrophy and chronic microvascular ischemic changes. 2. No acute/traumatic cervical spine pathology. Multilevel degenerative changes. 3. Paranasal sinus disease. Electronically Signed   By: Anner Crete M.D.   On: 05/28/2021 02:10   CT Chest W Contrast  Result Date: 05/28/2021 CLINICAL DATA:  Altered mental status,  found down, hypothermic EXAM: CT CHEST WITH CONTRAST TECHNIQUE: Multidetector CT imaging of the chest was performed during intravenous contrast administration. CONTRAST:  179mL ISOVUE-370 IOPAMIDOL (ISOVUE-370) INJECTION 76% COMPARISON:  Chest radiograph dated 05/27/2021 FINDINGS: Cardiovascular: Heart is normal in size.  No pericardial effusion. No evidence of  thoracic aortic aneurysm. Mild atherosclerotic calcifications of the aortic arch. Three vessel coronary atherosclerosis. Mediastinum/Nodes: No suspicious mediastinal lymphadenopathy. Visualized thyroid is unremarkable. Lungs/Pleura: Mild dependent atelectasis in the bilateral upper and lower lobes. Superimposed mild ground-glass opacity in the inferior right middle lobe (series 8/image 103) and bilateral lung bases could reflect mild aspiration, particularly given associated mild endobronchial debris in the right lower lobe bronchi (series 7/image 111). No suspicious pulmonary nodules. No pleural effusion or pneumothorax. Upper Abdomen: Evaluated on dedicated CTA. Musculoskeletal: Nondisplaced right anterolateral 5th and 6th rib fractures (series 7/images 99 and 125). Thoracic spine is within normal limits. IMPRESSION: Nondisplaced right anterolateral 5th and 6th rib fractures. No pneumothorax is seen. Suspected mild aspiration in the right middle lobe and bilateral lung bases. Aortic Atherosclerosis (ICD10-I70.0). Electronically Signed   By: Julian Hy M.D.   On: 05/28/2021 02:17   CT CERVICAL SPINE WO CONTRAST  Result Date: 05/28/2021 CLINICAL DATA:  Trauma. EXAM: CT HEAD WITHOUT CONTRAST CT CERVICAL SPINE WITHOUT CONTRAST TECHNIQUE: Multidetector CT imaging of the head and cervical spine was performed following the standard protocol without intravenous contrast. Multiplanar CT image reconstructions of the cervical spine were also generated. COMPARISON:  None. FINDINGS: CT HEAD FINDINGS Brain: Mild age-related atrophy and chronic microvascular ischemic changes. No acute intracranial hemorrhage. No mass effect or midline shift. No extra-axial fluid collection. Vascular: No hyperdense vessel or unexpected calcification. Skull: Normal. Negative for fracture or focal lesion. Sinuses/Orbits: Diffuse mucoperiosteal thickening of paranasal sinuses with partial opacification of several ethmoid air cells and  left maxillary sinus. There is air-fluid level in the left maxillary sinus. The mastoid air cells are clear. Other: None CT CERVICAL SPINE FINDINGS Alignment: No acute subluxation. Skull base and vertebrae: No acute fracture. Soft tissues and spinal canal: No prevertebral fluid or swelling. No visible canal hematoma. Disc levels: Multilevel degenerative changes with multilevel facet arthropathy. Upper chest: Probable atelectasis. Other: None IMPRESSION: 1. No acute intracranial pathology. Mild age-related atrophy and chronic microvascular ischemic changes. 2. No acute/traumatic cervical spine pathology. Multilevel degenerative changes. 3. Paranasal sinus disease. Electronically Signed   By: Anner Crete M.D.   On: 05/28/2021 02:10   CT Angio Aortobifemoral W and/or Wo Contrast  Result Date: 05/28/2021 CLINICAL DATA:  Found down, cyanotic feet EXAM: CT ANGIOGRAPHY OF ABDOMINAL AORTA WITH ILIOFEMORAL RUNOFF TECHNIQUE: Multidetector CT imaging of the abdomen, pelvis and lower extremities was performed using the standard protocol during bolus administration of intravenous contrast. Multiplanar CT image reconstructions and MIPs were obtained to evaluate the vascular anatomy. CONTRAST:  165mL ISOVUE-370 IOPAMIDOL (ISOVUE-370) INJECTION 76% COMPARISON:  None. FINDINGS: VASCULAR Aorta: Patent.  Mild atherosclerotic calcifications. Celiac: Patent.  Atherosclerotic calcifications at the origin. SMA: Patent. Renals: Patent bilaterally. Atherosclerotic calcifications of the origin on the left. IMA: Patent. RIGHT Lower Extremity Inflow: Patent. Outflow: Patent. Runoff: Patent three-vessel runoff. LEFT Lower Extremity Inflow: Patent. Outflow: Patent. Runoff: Patent three-vessel runoff. Veins: Unremarkable. Review of the MIP images confirms the above findings. NON-VASCULAR Lower chest: Evaluated on dedicated CT chest. Hepatobiliary: Liver is within normal limits. Gallbladder is unremarkable. No intrahepatic or  extrahepatic duct dilatation. Pancreas: Within normal limits. Spleen: Within normal limits. Adrenals/Urinary Tract: Mild nodular thickening of the left adrenal  gland without discrete mass. Bilateral renal cysts, measuring up to 4.2 cm in the left upper kidney (series 7/image 80). No hydronephrosis. Bladder is within normal limits. Stomach/Bowel: Stomach is within normal limits. No evidence of bowel obstruction. Appendix is not discretely visualized. Sigmoid diverticulosis, without evidence of diverticulitis. Lymphatic: No suspicious abdominopelvic lymphadenopathy. Reproductive: Prostate is unremarkable. Other: No abdominopelvic ascites. Musculoskeletal: Mild degenerative changes of the visualized thoracolumbar spine. IMPRESSION: Patent three-vessel runoff bilaterally. Additional ancillary findings as above. Electronically Signed   By: Charline Bills M.D.   On: 05/28/2021 02:12   DG Chest Port 1 View  Result Date: 05/30/2021 CLINICAL DATA:  Shortness of breath, COVID positive EXAM: PORTABLE CHEST 1 VIEW COMPARISON:  05/19/2021 FINDINGS: The heart size and mediastinal contours are within normal limits. Bibasilar scarring and or atelectasis, unchanged. The visualized skeletal structures are unremarkable. IMPRESSION: Bibasilar scarring and or atelectasis, unchanged in AP portable projection. No new airspace opacity. Electronically Signed   By: Jearld Lesch M.D.   On: 05/30/2021 08:40   DG Chest Port 1 View  Result Date: 05/29/2021 CLINICAL DATA:  70 year old male with history of shortness of breath. COVID positive patient. EXAM: PORTABLE CHEST 1 VIEW COMPARISON:  Chest x-ray 05/28/2021. FINDINGS: Low lung volumes. Bibasilar opacities which may reflect areas of atelectasis and/or consolidation. Small bilateral pleural effusions. No pneumothorax. No evidence of pulmonary edema. Heart size is normal. Upper mediastinal contours are within normal limits. IMPRESSION: 1. Low lung volumes with bibasilar areas of  atelectasis and/or consolidation and small bilateral pleural effusions. Electronically Signed   By: Trudie Reed M.D.   On: 05/29/2021 08:03   DG Chest Port 1 View  Result Date: 05/28/2021 CLINICAL DATA:  70 year old male found down.  Positive COVID-19. EXAM: PORTABLE CHEST 1 VIEW COMPARISON:  CT chest 0102 hours today and earlier. FINDINGS: Portable AP upright view at 0757 hours. Mildly lower lung volumes compared to yesterday. Normal cardiac size and mediastinal contours. No pneumothorax or pleural effusion. Allowing for portable technique the lungs are clear. Visualized tracheal air column is within normal limits. Right 5th and 6th rib fractures better demonstrated by CT. No new osseous abnormality identified. Negative visible bowel gas. IMPRESSION: 1. No new cardiopulmonary abnormality. 2. Right 5th and 6th rib fractures better demonstrated by CT today. Electronically Signed   By: Odessa Fleming M.D.   On: 05/28/2021 08:09   DG Chest Port 1 View  Result Date: 05/27/2021 CLINICAL DATA:  Altered mental status, hypothermia EXAM: PORTABLE CHEST 1 VIEW COMPARISON:  None. FINDINGS: Heart and mediastinal contours are within normal limits. No focal opacities or effusions. No acute bony abnormality. IMPRESSION: No active cardiopulmonary disease. Electronically Signed   By: Charlett Nose M.D.   On: 05/27/2021 22:41   EEG adult  Result Date: 05/29/2021 Charlsie Quest, MD     05/29/2021  5:30 PM Patient Name: TYREL LEX MRN: [id number] Epilepsy Attending: Charlsie Quest Referring Physician/Provider: John Giovanni, MD Date: 05/29/2021 Duration: 25.14 mins Patient history: 70yo m with ams. EEG to evaluate for seizure Level of alertness: Asleep AEDs during EEG study: None Technical aspects: This EEG study was done with scalp electrodes positioned according to the 10-20 International system of electrode placement. Electrical activity was acquired at a sampling rate of 500Hz  and reviewed with a high  frequency filter of 70Hz  and a low frequency filter of 1Hz . EEG data were recorded continuously and digitally stored. Description: Sleep was characterized by vertex waves, sleep spindles (12 to 14 Hz), maximal frontocentral  region. Hyperventilation and photic stimulation were not performed.   IMPRESSION: This study during sleep only is within normal limits. No seizures or epileptiform discharges were seen throughout the recording. Lora Havens   ECHOCARDIOGRAM COMPLETE  Result Date: 05/30/2021    ECHOCARDIOGRAM REPORT   Patient Name:   OMARIUS DIAS Cheuvront Date of Exam: 05/30/2021 Medical Rec #:  [id number]    Height:       74.0 in Accession #:    [id number]   Weight:       228.5 lb Date of Birth:  [birth year]    BSA:          2.300 m Patient Age:    76 years     BP:           163/89 mmHg Patient Gender: M            HR:           82 bpm. Exam Location:  Inpatient Procedure: 2D Echo, Cardiac Doppler and Color Doppler Indications:    R55 Syncope  History:        Patient has no prior history of Echocardiogram examinations.                 Risk Factors:Hypertension.  Sonographer:    Bernadene Person RDCS Referring Phys: [id number] [place]  1. Left ventricular ejection fraction, by estimation, is 45 to 50%. The left ventricle has mildly decreased function. The left ventricle demonstrates global hypokinesis. Left ventricular diastolic parameters are consistent with Grade I diastolic dysfunction (impaired relaxation).  2. Right ventricular systolic function is normal. The right ventricular size is normal.  3. The mitral valve is normal in structure. No evidence of mitral valve regurgitation. No evidence of mitral stenosis.  4. The aortic valve is normal in structure. Aortic valve regurgitation is not visualized. No aortic stenosis is present.  5. Aortic dilatation noted. There is mild dilatation of the ascending aorta, measuring 40 mm. There is mild dilatation of the aortic root, measuring 38 mm.  6. The  inferior vena cava is normal in size with greater than 50% respiratory variability, suggesting right atrial pressure of 3 mmHg. FINDINGS  Left Ventricle: Left ventricular ejection fraction, by estimation, is 45 to 50%. The left ventricle has mildly decreased function. The left ventricle demonstrates global hypokinesis. The left ventricular internal cavity size was normal in size. There is  no left ventricular hypertrophy. Left ventricular diastolic parameters are consistent with Grade I diastolic dysfunction (impaired relaxation). Normal left ventricular filling pressure. Right Ventricle: The right ventricular size is normal. No increase in right ventricular wall thickness. Right ventricular systolic function is normal. Left Atrium: Left atrial size was normal in size. Right Atrium: Right atrial size was normal in size. Pericardium: There is no evidence of pericardial effusion. Mitral Valve: The mitral valve is normal in structure. Mild mitral annular calcification. No evidence of mitral valve regurgitation. No evidence of mitral valve stenosis. Tricuspid Valve: The tricuspid valve is normal in structure. Tricuspid valve regurgitation is not demonstrated. No evidence of tricuspid stenosis. Aortic Valve: The aortic valve is normal in structure. Aortic valve regurgitation is not visualized. No aortic stenosis is present. Pulmonic Valve: The pulmonic valve was normal in structure. Pulmonic valve regurgitation is not visualized. No evidence of pulmonic stenosis. Aorta: Aortic dilatation noted. There is mild dilatation of the ascending aorta, measuring 40 mm. There is mild dilatation of the aortic root, measuring 38 mm. Venous: The inferior vena cava is  normal in size with greater than 50% respiratory variability, suggesting right atrial pressure of 3 mmHg. IAS/Shunts: No atrial level shunt detected by color flow Doppler.  LEFT VENTRICLE PLAX 2D LVIDd:         3.50 cm      Diastology LVIDs:         2.80 cm      LV e'  medial:    6.24 cm/s LV PW:         1.00 cm      LV E/e' medial:  7.3 LV IVS:        1.00 cm      LV e' lateral:   9.89 cm/s LVOT diam:     2.20 cm      LV E/e' lateral: 4.6 LV SV:         63 LV SV Index:   27 LVOT Area:     3.80 cm  LV Volumes (MOD) LV vol d, MOD A2C: 95.3 ml LV vol d, MOD A4C: 115.0 ml LV vol s, MOD A2C: 46.5 ml LV vol s, MOD A4C: 59.5 ml LV SV MOD A2C:     48.8 ml LV SV MOD A4C:     115.0 ml LV SV MOD BP:      52.3 ml RIGHT VENTRICLE RV S prime:     14.70 cm/s TAPSE (M-mode): 1.5 cm LEFT ATRIUM             Index        RIGHT ATRIUM           Index LA diam:        2.50 cm 1.09 cm/m   RA Area:     12.70 cm LA Vol (A2C):   48.6 ml 21.13 ml/m  RA Volume:   24.90 ml  10.83 ml/m LA Vol (A4C):   43.2 ml 18.78 ml/m LA Biplane Vol: 49.1 ml 21.35 ml/m  AORTIC VALVE LVOT Vmax:   95.30 cm/s LVOT Vmean:  75.800 cm/s LVOT VTI:    0.165 m  AORTA Ao Root diam: 3.80 cm Ao Asc diam:  4.00 cm MITRAL VALVE MV Area (PHT): 3.68 cm    SHUNTS MV Decel Time: 206 msec    Systemic VTI:  0.16 m MV E velocity: 45.40 cm/s  Systemic Diam: 2.20 cm MV A velocity: 83.60 cm/s MV E/A ratio:  0.54 Mihai Croitoru MD Electronically signed by Sanda Klein MD Signature Date/Time: 05/30/2021/3:12:31 PM    Final

## 2021-05-31 NOTE — Progress Notes (Addendum)
Patient's granddaughter, Summer's school note is on the patient's window seal. Daughter, [name] notified.

## 2021-05-31 NOTE — Progress Notes (Signed)
1254: Patient very drowsy and lethargic this AM. He has not been alert enough to give AM meds. Daughter at bedside. Nursing to administer medications when safe for patient.

## 2021-05-31 NOTE — Consult Note (Signed)
Cardiology Consultation:   Patient ID: Fred Graham MRN: [id number]; DOB: 10-Feb-1951  Admit date: 05/27/2021 Date of Consult: 05/31/2021  PCP:  Maximiano Coss, NP   [facility] Providers Cardiologist:  New  Patient Profile:   Fred Graham is a 70 y.o. male with a hx of HTN, depression, chronic thrombocytopenia and alcohol abuse who is being seen 05/31/2021 for the evaluation of abnormal echo at the request of Dr. Waldron Labs.   History of Present Illness:   Fred Graham brought in by EMS 12/10 for being found on floor for unknown period of time.  Police was called for welfare check after family member unable to reach the patient for for few days.  Police found patient on floor in his underwear.  He was very cold to touch and no eating at home.  Bruising on multiple areas.  Patient with history of heavy alcohol use.  Patient was hypothermic on rectal temperature at 95.7 Fahrenheit.  Patient was not hypoxic.  Elevated LFTs.  Patient was admitted for sepsis secondary to COVID infection and aspiration pneumonia.  Patient was treated with broad-spectrum antibiotic, bicarb, remdesivir, steroids and fluid resuscitation.  Head CT was negative for acute finding.  Patient with history of chronic hyponatremia which got worse with hydration, now improved.  There was nondisplaced fifth and sixth rib fracture on CT of chest.  No pneumothorax.  Hs troponin 88>>107>>228 on admit. In setting of CK of 5600>>4016.  Consistent with rhabdomyolysis.   Echocardiogram showed mildly reduced LV function at 45 to 50%, global hypokinesis and grade 1 diastolic dysfunction.  Mildly dilatation of ascending aorta at 40 mm.  Now cardiology asked for further evaluation.  Fred Graham is not able to provide history.  He is quite confused and drowsy during my examination.  He will move his extremities and follow commands but would not answer questions appropriately.  Per review of hospital medicine records he is currently being  treated for alcohol withdrawal.  He has no evidence of volume overload.  Cardiovascular exam is normal.  No murmurs.   Past Medical History:  Diagnosis Date   Allergy    Arthritis    hands, hips   Hypertension    History reviewed. No pertinent surgical history.   Inpatient Medications: Scheduled Meds:  amLODipine  10 mg Oral Daily   aspirin  81 mg Oral Daily   carvedilol  3.125 mg Oral BID WC   chlorhexidine  15 mL Mouth Rinse BID   dexamethasone (DECADRON) injection  6 mg Intravenous Q24H   enoxaparin (LOVENOX) injection  40 mg Subcutaneous [id number]   folic acid  1 mg Oral Daily   mouth rinse  15 mL Mouth Rinse q12n4p   metroNIDAZOLE  500 mg Oral Q8H   multivitamin with minerals  1 tablet Oral Daily   rosuvastatin  10 mg Oral Daily   thiamine  100 mg Oral Daily   Or   thiamine  100 mg Intravenous Daily   Continuous Infusions:  sodium chloride     remdesivir 100 mg in NS 100 mL 100 mg (05/31/21 1026)   PRN Meds: sodium chloride, lip balm, loperamide, [DISCONTINUED] LORazepam **OR** LORazepam  Allergies:    Allergies  Allergen Reactions   Penicillins     Rash     Social History:   Social History   Socioeconomic History   Marital status: Divorced    Spouse name: Not on file   Number of children: 1   Years of education: Not on  file   Highest education level: Not on file  Occupational History   Not on file  Tobacco Use   Smoking status: Former    Packs/day: 1.00    Years: 5.00    Pack years: 5.00    Types: Cigarettes    Quit date: 10/19/1993    Years since quitting: 27.6   Smokeless tobacco: Never  Vaping Use   Vaping Use: Never used  Substance and Sexual Activity   Alcohol use: Yes    Alcohol/week: 10.0 standard drinks    Types: 10 Cans of beer per week   Drug use: No   Sexual activity: Never    Birth control/protection: None  Other Topics Concern   Not on file  Social History Narrative   Marital status: divorced.      Children: 1 daughter; 4  grandchildren.      Employment: retired from [employer] in 2010; odd jobs.      Tobacco: former smoker.       Alcohol:  4 beers per day.      Exercise: walking.   Social Determinants of Health   Financial Resource Strain: Not on file  Food Insecurity: Not on file  Transportation Needs: Not on file  Physical Activity: Not on file  Stress: Not on file  Social Connections: Not on file  Intimate Partner Violence: Not on file    Family History:   Family History  Problem Relation Age of Onset   Cancer Brother        skin cancer   Heart disease Brother 55       cardiac stenting   Depression Father    Heart disease Father 86       CABG     ROS:  Please see the history of present illness.  All other ROS reviewed and negative.     Physical Exam/Data:   Vitals:   05/30/21 2332 05/31/21 0442 05/31/21 0739 05/31/21 1207  BP: (!) 148/90 (!) 152/86 (!) 152/96 140/85  Pulse: 99 (!) 105 93 86  Resp: 20 20 18 18   Temp: 98.3 F (36.8 C) (!) 97.5 F (36.4 C) 98.1 F (36.7 C) 97.7 F (36.5 C)  TempSrc: Axillary Oral Axillary Axillary  SpO2: 92% 92% 93% 96%  Weight:      Height:        Intake/Output Summary (Last 24 hours) at 05/31/2021 1403 Last data filed at 05/31/2021 1302 Gross per 24 hour  Intake 320 ml  Output 1485 ml  Net -1165 ml   Last 3 Weights 05/28/2021 05/05/2019 10/20/2018  Weight (lbs) 228 lb 8 oz 205 lb 210 lb  Weight (kg) 103.647 kg 92.987 kg 95.255 kg     Body mass index is 29.34 kg/m.  General: Ill-appearing, confused HEENT: normal Neck: no JVD Vascular: No carotid bruits; Distal pulses 2+ bilaterally Cardiac:  normal S1, S2; RRR; no murmur Lungs:  clear to auscultation bilaterally, no wheezing, rhonchi or rales  Abd: soft, nontender, no hepatomegaly  Ext: no edema Musculoskeletal:  No deformities Skin: warm and dry  Neuro: Spontaneously moving all extremities, alert, drowsy, will follow commands, will not answer questions  appropriately  EKG:  The EKG was personally reviewed and demonstrates: Normal sinus rhythm heart rate 91, no acute ischemic changes or evidence of infarction Telemetry:  Telemetry was personally reviewed and demonstrates:  Sinus rhythm with PVCs  Relevant CV Studies: Echo 05/30/2021  1. Left ventricular ejection fraction, by estimation, is 45 to 50%. The  left ventricle has mildly decreased function. The left ventricle  demonstrates global hypokinesis. Left ventricular diastolic parameters are  consistent with Grade I diastolic  dysfunction (impaired relaxation).   2. Right ventricular systolic function is normal. The right ventricular  size is normal.   3. The mitral valve is normal in structure. No evidence of mitral valve  regurgitation. No evidence of mitral stenosis.   4. The aortic valve is normal in structure. Aortic valve regurgitation is  not visualized. No aortic stenosis is present.   5. Aortic dilatation noted. There is mild dilatation of the ascending  aorta, measuring 40 mm. There is mild dilatation of the aortic root,  measuring 38 mm.   6. The inferior vena cava is normal in size with greater than 50%  respiratory variability, suggesting right atrial pressure of 3 mmHg.   Laboratory Data:  High Sensitivity Troponin:   Recent Labs  Lab 05/27/21 2222 05/28/21 0024 05/28/21 0537  TROPONINIHS 88* 107* 228*     Chemistry Recent Labs  Lab 05/29/21 0628 05/29/21 1736 05/30/21 0242 05/31/21 0225  NA 125* 129* 130* 130*  K 3.2*  --  3.7 3.5  CL 85*  --  94* 94*  CO2 29  --  25 27  GLUCOSE 91  --  125* 121*  BUN 17  --  14 17  CREATININE 0.76  --  0.84 0.73  CALCIUM 8.2*  --  8.6* 8.7*  MG 2.0  --  2.2 2.1  GFRNONAA >60  --  >60 >60  ANIONGAP 11  --  11 9    Recent Labs  Lab 05/29/21 0628 05/30/21 0242 05/31/21 0225  PROT 5.8*   5.7* 5.8* 5.7*  ALBUMIN 2.9*   2.9* 2.8* 2.9*  AST 142*   145* 110* 74*  ALT 87*   89* 90* 81*  ALKPHOS 64   52 48 53   BILITOT 1.0   1.1 0.8 0.9    Hematology Recent Labs  Lab 05/29/21 0628 05/30/21 0242 05/31/21 0225  WBC 17.1* 15.6* 17.5*  RBC 4.66 4.82 4.70  HGB 14.8 15.7 15.0  HCT 41.0 42.2 41.8  MCV 88.0 87.6 88.9  MCH 31.8 32.6 31.9  MCHC 36.1* 37.2* 35.9  RDW 12.4 12.4 12.5  PLT 382 304 400   Thyroid  Recent Labs  Lab 05/28/21 0537  TSH 2.252    BNP Recent Labs  Lab 05/29/21 0628 05/30/21 0242 05/31/21 0225  BNP 249.7* 352.7* 177.9*    DDimer  Recent Labs  Lab 05/29/21 0628 05/30/21 0242 05/31/21 0225  DDIMER 1.51* 1.11* 1.16*     Radiology/Studies:  CT HEAD WO CONTRAST  Result Date: 05/28/2021 CLINICAL DATA:  Trauma. EXAM: CT HEAD WITHOUT CONTRAST CT CERVICAL SPINE WITHOUT CONTRAST TECHNIQUE: Multidetector CT imaging of the head and cervical spine was performed following the standard protocol without intravenous contrast. Multiplanar CT image reconstructions of the cervical spine were also generated. COMPARISON:  None. FINDINGS: CT HEAD FINDINGS Brain: Mild age-related atrophy and chronic microvascular ischemic changes. No acute intracranial hemorrhage. No mass effect or midline shift. No extra-axial fluid collection. Vascular: No hyperdense vessel or unexpected calcification. Skull: Normal. Negative for fracture or focal lesion. Sinuses/Orbits: Diffuse mucoperiosteal thickening of paranasal sinuses with partial opacification of several ethmoid air cells and left maxillary sinus. There is air-fluid level in the left maxillary sinus. The mastoid air cells are clear. Other: None CT CERVICAL SPINE FINDINGS Alignment: No acute subluxation. Skull base and vertebrae: No acute fracture. Soft tissues  and spinal canal: No prevertebral fluid or swelling. No visible canal hematoma. Disc levels: Multilevel degenerative changes with multilevel facet arthropathy. Upper chest: Probable atelectasis. Other: None IMPRESSION: 1. No acute intracranial pathology. Mild age-related atrophy and  chronic microvascular ischemic changes. 2. No acute/traumatic cervical spine pathology. Multilevel degenerative changes. 3. Paranasal sinus disease. Electronically Signed   By: Anner Crete M.D.   On: 05/28/2021 02:10   CT Chest W Contrast  Result Date: 05/28/2021 CLINICAL DATA:  Altered mental status, found down, hypothermic EXAM: CT CHEST WITH CONTRAST TECHNIQUE: Multidetector CT imaging of the chest was performed during intravenous contrast administration. CONTRAST:  132mL ISOVUE-370 IOPAMIDOL (ISOVUE-370) INJECTION 76% COMPARISON:  Chest radiograph dated 05/27/2021 FINDINGS: Cardiovascular: Heart is normal in size.  No pericardial effusion. No evidence of thoracic aortic aneurysm. Mild atherosclerotic calcifications of the aortic arch. Three vessel coronary atherosclerosis. Mediastinum/Nodes: No suspicious mediastinal lymphadenopathy. Visualized thyroid is unremarkable. Lungs/Pleura: Mild dependent atelectasis in the bilateral upper and lower lobes. Superimposed mild ground-glass opacity in the inferior right middle lobe (series 8/image 103) and bilateral lung bases could reflect mild aspiration, particularly given associated mild endobronchial debris in the right lower lobe bronchi (series 7/image 111). No suspicious pulmonary nodules. No pleural effusion or pneumothorax. Upper Abdomen: Evaluated on dedicated CTA. Musculoskeletal: Nondisplaced right anterolateral 5th and 6th rib fractures (series 7/images 99 and 125). Thoracic spine is within normal limits. IMPRESSION: Nondisplaced right anterolateral 5th and 6th rib fractures. No pneumothorax is seen. Suspected mild aspiration in the right middle lobe and bilateral lung bases. Aortic Atherosclerosis (ICD10-I70.0). Electronically Signed   By: Julian Hy M.D.   On: 05/28/2021 02:17   CT CERVICAL SPINE WO CONTRAST  Result Date: 05/28/2021 CLINICAL DATA:  Trauma. EXAM: CT HEAD WITHOUT CONTRAST CT CERVICAL SPINE WITHOUT CONTRAST TECHNIQUE:  Multidetector CT imaging of the head and cervical spine was performed following the standard protocol without intravenous contrast. Multiplanar CT image reconstructions of the cervical spine were also generated. COMPARISON:  None. FINDINGS: CT HEAD FINDINGS Brain: Mild age-related atrophy and chronic microvascular ischemic changes. No acute intracranial hemorrhage. No mass effect or midline shift. No extra-axial fluid collection. Vascular: No hyperdense vessel or unexpected calcification. Skull: Normal. Negative for fracture or focal lesion. Sinuses/Orbits: Diffuse mucoperiosteal thickening of paranasal sinuses with partial opacification of several ethmoid air cells and left maxillary sinus. There is air-fluid level in the left maxillary sinus. The mastoid air cells are clear. Other: None CT CERVICAL SPINE FINDINGS Alignment: No acute subluxation. Skull base and vertebrae: No acute fracture. Soft tissues and spinal canal: No prevertebral fluid or swelling. No visible canal hematoma. Disc levels: Multilevel degenerative changes with multilevel facet arthropathy. Upper chest: Probable atelectasis. Other: None IMPRESSION: 1. No acute intracranial pathology. Mild age-related atrophy and chronic microvascular ischemic changes. 2. No acute/traumatic cervical spine pathology. Multilevel degenerative changes. 3. Paranasal sinus disease. Electronically Signed   By: Anner Crete M.D.   On: 05/28/2021 02:10   CT Angio Aortobifemoral W and/or Wo Contrast  Result Date: 05/28/2021 CLINICAL DATA:  Found down, cyanotic feet EXAM: CT ANGIOGRAPHY OF ABDOMINAL AORTA WITH ILIOFEMORAL RUNOFF TECHNIQUE: Multidetector CT imaging of the abdomen, pelvis and lower extremities was performed using the standard protocol during bolus administration of intravenous contrast. Multiplanar CT image reconstructions and MIPs were obtained to evaluate the vascular anatomy. CONTRAST:  11mL ISOVUE-370 IOPAMIDOL (ISOVUE-370) INJECTION 76%  COMPARISON:  None. FINDINGS: VASCULAR Aorta: Patent.  Mild atherosclerotic calcifications. Celiac: Patent.  Atherosclerotic calcifications at the origin. SMA: Patent. Renals: Patent  bilaterally. Atherosclerotic calcifications of the origin on the left. IMA: Patent. RIGHT Lower Extremity Inflow: Patent. Outflow: Patent. Runoff: Patent three-vessel runoff. LEFT Lower Extremity Inflow: Patent. Outflow: Patent. Runoff: Patent three-vessel runoff. Veins: Unremarkable. Review of the MIP images confirms the above findings. NON-VASCULAR Lower chest: Evaluated on dedicated CT chest. Hepatobiliary: Liver is within normal limits. Gallbladder is unremarkable. No intrahepatic or extrahepatic duct dilatation. Pancreas: Within normal limits. Spleen: Within normal limits. Adrenals/Urinary Tract: Mild nodular thickening of the left adrenal gland without discrete mass. Bilateral renal cysts, measuring up to 4.2 cm in the left upper kidney (series 7/image 80). No hydronephrosis. Bladder is within normal limits. Stomach/Bowel: Stomach is within normal limits. No evidence of bowel obstruction. Appendix is not discretely visualized. Sigmoid diverticulosis, without evidence of diverticulitis. Lymphatic: No suspicious abdominopelvic lymphadenopathy. Reproductive: Prostate is unremarkable. Other: No abdominopelvic ascites. Musculoskeletal: Mild degenerative changes of the visualized thoracolumbar spine. IMPRESSION: Patent three-vessel runoff bilaterally. Additional ancillary findings as above. Electronically Signed   By: Julian Hy M.D.   On: 05/28/2021 02:12   DG Chest Port 1 View  Result Date: 05/30/2021 CLINICAL DATA:  Shortness of breath, COVID positive EXAM: PORTABLE CHEST 1 VIEW COMPARISON:  05/19/2021 FINDINGS: The heart size and mediastinal contours are within normal limits. Bibasilar scarring and or atelectasis, unchanged. The visualized skeletal structures are unremarkable. IMPRESSION: Bibasilar scarring and or  atelectasis, unchanged in AP portable projection. No new airspace opacity. Electronically Signed   By: Delanna Ahmadi M.D.   On: 05/30/2021 08:40   DG Chest Port 1 View  Result Date: 05/29/2021 CLINICAL DATA:  70 year old male with history of shortness of breath. COVID positive patient. EXAM: PORTABLE CHEST 1 VIEW COMPARISON:  Chest x-ray 05/28/2021. FINDINGS: Low lung volumes. Bibasilar opacities which may reflect areas of atelectasis and/or consolidation. Small bilateral pleural effusions. No pneumothorax. No evidence of pulmonary edema. Heart size is normal. Upper mediastinal contours are within normal limits. IMPRESSION: 1. Low lung volumes with bibasilar areas of atelectasis and/or consolidation and small bilateral pleural effusions. Electronically Signed   By: Vinnie Langton M.D.   On: 05/29/2021 08:03   DG Chest Port 1 View  Result Date: 05/28/2021 CLINICAL DATA:  70 year old male found down.  Positive COVID-19. EXAM: PORTABLE CHEST 1 VIEW COMPARISON:  CT chest 0102 hours today and earlier. FINDINGS: Portable AP upright view at 0757 hours. Mildly lower lung volumes compared to yesterday. Normal cardiac size and mediastinal contours. No pneumothorax or pleural effusion. Allowing for portable technique the lungs are clear. Visualized tracheal air column is within normal limits. Right 5th and 6th rib fractures better demonstrated by CT. No new osseous abnormality identified. Negative visible bowel gas. IMPRESSION: 1. No new cardiopulmonary abnormality. 2. Right 5th and 6th rib fractures better demonstrated by CT today. Electronically Signed   By: Genevie Ann M.D.   On: 05/28/2021 08:09   DG Chest Port 1 View  Result Date: 05/27/2021 CLINICAL DATA:  Altered mental status, hypothermia EXAM: PORTABLE CHEST 1 VIEW COMPARISON:  None. FINDINGS: Heart and mediastinal contours are within normal limits. No focal opacities or effusions. No acute bony abnormality. IMPRESSION: No active cardiopulmonary disease.  Electronically Signed   By: Rolm Baptise M.D.   On: 05/27/2021 22:41   EEG adult  Result Date: 05/29/2021 Lora Havens, MD     05/29/2021  5:30 PM Patient Name: Fred Graham MRN: [id number] Epilepsy Attending: Lora Havens Referring Physician/Provider: Shela Leff, MD Date: 05/29/2021 Duration: 25.14 mins Patient history: 70yo m with  ams. EEG to evaluate for seizure Level of alertness: Asleep AEDs during EEG study: None Technical aspects: This EEG study was done with scalp electrodes positioned according to the 10-20 International system of electrode placement. Electrical activity was acquired at a sampling rate of 500Hz  and reviewed with a high frequency filter of 70Hz  and a low frequency filter of 1Hz . EEG data were recorded continuously and digitally stored. Description: Sleep was characterized by vertex waves, sleep spindles (12 to 14 Hz), maximal frontocentral region. Hyperventilation and photic stimulation were not performed.   IMPRESSION: This study during sleep only is within normal limits. No seizures or epileptiform discharges were seen throughout the recording. Lora Havens   ECHOCARDIOGRAM COMPLETE  Result Date: 05/30/2021    ECHOCARDIOGRAM REPORT   Patient Name:   Fred Graham Date of Exam: 05/30/2021 Medical Rec #:  [id number]    Height:       74.0 in Accession #:    [id number]   Weight:       228.5 lb Date of Birth:  December 09, 1950    BSA:          2.300 m Patient Age:    40 years     BP:           163/89 mmHg Patient Gender: M            HR:           82 bpm. Exam Location:  Inpatient Procedure: 2D Echo, Cardiac Doppler and Color Doppler Indications:    R55 Syncope  History:        Patient has no prior history of Echocardiogram examinations.                 Risk Factors:Hypertension.  Sonographer:    Bernadene Person RDCS Referring Phys: [id number] [place]  1. Left ventricular ejection fraction, by estimation, is 45 to 50%. The left ventricle has mildly  decreased function. The left ventricle demonstrates global hypokinesis. Left ventricular diastolic parameters are consistent with Grade I diastolic dysfunction (impaired relaxation).  2. Right ventricular systolic function is normal. The right ventricular size is normal.  3. The mitral valve is normal in structure. No evidence of mitral valve regurgitation. No evidence of mitral stenosis.  4. The aortic valve is normal in structure. Aortic valve regurgitation is not visualized. No aortic stenosis is present.  5. Aortic dilatation noted. There is mild dilatation of the ascending aorta, measuring 40 mm. There is mild dilatation of the aortic root, measuring 38 mm.  6. The inferior vena cava is normal in size with greater than 50% respiratory variability, suggesting right atrial pressure of 3 mmHg. FINDINGS  Left Ventricle: Left ventricular ejection fraction, by estimation, is 45 to 50%. The left ventricle has mildly decreased function. The left ventricle demonstrates global hypokinesis. The left ventricular internal cavity size was normal in size. There is  no left ventricular hypertrophy. Left ventricular diastolic parameters are consistent with Grade I diastolic dysfunction (impaired relaxation). Normal left ventricular filling pressure. Right Ventricle: The right ventricular size is normal. No increase in right ventricular wall thickness. Right ventricular systolic function is normal. Left Atrium: Left atrial size was normal in size. Right Atrium: Right atrial size was normal in size. Pericardium: There is no evidence of pericardial effusion. Mitral Valve: The mitral valve is normal in structure. Mild mitral annular calcification. No evidence of mitral valve regurgitation. No evidence of mitral valve stenosis. Tricuspid Valve: The tricuspid valve is normal in  structure. Tricuspid valve regurgitation is not demonstrated. No evidence of tricuspid stenosis. Aortic Valve: The aortic valve is normal in structure. Aortic  valve regurgitation is not visualized. No aortic stenosis is present. Pulmonic Valve: The pulmonic valve was normal in structure. Pulmonic valve regurgitation is not visualized. No evidence of pulmonic stenosis. Aorta: Aortic dilatation noted. There is mild dilatation of the ascending aorta, measuring 40 mm. There is mild dilatation of the aortic root, measuring 38 mm. Venous: The inferior vena cava is normal in size with greater than 50% respiratory variability, suggesting right atrial pressure of 3 mmHg. IAS/Shunts: No atrial level shunt detected by color flow Doppler.  LEFT VENTRICLE PLAX 2D LVIDd:         3.50 cm      Diastology LVIDs:         2.80 cm      LV e' medial:    6.24 cm/s LV PW:         1.00 cm      LV E/e' medial:  7.3 LV IVS:        1.00 cm      LV e' lateral:   9.89 cm/s LVOT diam:     2.20 cm      LV E/e' lateral: 4.6 LV SV:         63 LV SV Index:   27 LVOT Area:     3.80 cm  LV Volumes (MOD) LV vol d, MOD A2C: 95.3 ml LV vol d, MOD A4C: 115.0 ml LV vol s, MOD A2C: 46.5 ml LV vol s, MOD A4C: 59.5 ml LV SV MOD A2C:     48.8 ml LV SV MOD A4C:     115.0 ml LV SV MOD BP:      52.3 ml RIGHT VENTRICLE RV S prime:     14.70 cm/s TAPSE (M-mode): 1.5 cm LEFT ATRIUM             Index        RIGHT ATRIUM           Index LA diam:        2.50 cm 1.09 cm/m   RA Area:     12.70 cm LA Vol (A2C):   48.6 ml 21.13 ml/m  RA Volume:   24.90 ml  10.83 ml/m LA Vol (A4C):   43.2 ml 18.78 ml/m LA Biplane Vol: 49.1 ml 21.35 ml/m  AORTIC VALVE LVOT Vmax:   95.30 cm/s LVOT Vmean:  75.800 cm/s LVOT VTI:    0.165 m  AORTA Ao Root diam: 3.80 cm Ao Asc diam:  4.00 cm MITRAL VALVE MV Area (PHT): 3.68 cm    SHUNTS MV Decel Time: 206 msec    Systemic VTI:  0.16 m MV E velocity: 45.40 cm/s  Systemic Diam: 2.20 cm MV A velocity: 83.60 cm/s MV E/A ratio:  0.54 Mihai Croitoru MD Electronically signed by Sanda Klein MD Signature Date/Time: 05/30/2021/3:12:31 PM    Final      Assessment and Plan:   #Heart failure with  midrange ejection fraction, 45-50% -He has no clinical evidence of heart failure.  He is admitted with multiorgan system failure in the setting of alcohol abuse and being found down with rhabdomyolysis. -Alcohol abuse seems to be a big issue here. -He also was found to have severe hyponatremia. -Labs are improving. -Also here with COVID-19 infection and aspiration pneumonia. -Also with transaminitis likely secondary to alcohol abuse. -Overall I suspect his cardiomyopathy is related to  critical illness versus alcohol abuse.  Likely multifactorial.  There is no regional wall motion abnormality seen on his echocardiogram.  His EKG is very reassuring and that shows no acute ischemic changes or evidence of infarction.  His troponins are minimally elevated but I suspect this is all secondary to rhabdomyolysis and critical illness. -He currently is quite confused and in the throes of alcohol withdrawal.  He is not a candidate for invasive cardiovascular evaluation such as coronary angiography.  Given his alcohol abuse he may never become a great candidate for invasive cardiovascular care. -I recommendation would be for him to refrain from alcohol use and to continue with medical therapy. -Continue Coreg 3.125 mg twice daily.  Add lisinopril 10 mg daily.  I really see no need for aggressive GDMT.  Again I believe this is likely multifactorial in setting of sepsis and alcohol abuse.  Also could be COVID-19 related.  Treatment would still be conservative. -The patient is clinically euvolemic.  You may diurese him with 20 mg of Lasix as needed.  He does not need any currently. -Again, not a candidate for an ischemia evaluation.  Suspect this will all improve with medical therapy and abstinence from alcohol.  #Elevated troponin, demand ischemia in the setting of critical illness -Troponins were minimally elevated.  EKG is stone cold normal.  Echo shows global hypokinesis.  He cannot complain of chest pain because  he is quite confused. -Suspect this is a secondary process and not a primary ACS event. -Would be cautious with aspirin.  If he will continue to use alcohol he may not be a great candidate for this. -No documented cirrhosis.  Continue statin.  [facility] will sign off.   Medication Recommendations: As above Other recommendations (labs, testing, etc): None. Follow up as an outpatient: He can follow-up with me in 3 to 4 months.  Signed, Addison Naegeli. Audie Box, [place]  05/31/2021 3:30 PM

## 2021-06-01 DIAGNOSIS — M6282 Rhabdomyolysis: Secondary | ICD-10-CM

## 2021-06-01 DIAGNOSIS — E871 Hypo-osmolality and hyponatremia: Secondary | ICD-10-CM

## 2021-06-01 DIAGNOSIS — L8990 Pressure ulcer of unspecified site, unspecified stage: Secondary | ICD-10-CM | POA: Insufficient documentation

## 2021-06-01 LAB — CBC WITH DIFFERENTIAL/PLATELET
Abs Immature Granulocytes: 0.1 10*3/uL — ABNORMAL HIGH (ref 0.00–0.07)
Basophils Absolute: 0 10*3/uL (ref 0.0–0.1)
Basophils Relative: 0 %
Eosinophils Absolute: 0 10*3/uL (ref 0.0–0.5)
Eosinophils Relative: 0 %
HCT: 43 % (ref 39.0–52.0)
Hemoglobin: 15.1 g/dL (ref 13.0–17.0)
Immature Granulocytes: 1 %
Lymphocytes Relative: 13 %
Lymphs Abs: 1.4 10*3/uL (ref 0.7–4.0)
MCH: 31.7 pg (ref 26.0–34.0)
MCHC: 35.1 g/dL (ref 30.0–36.0)
MCV: 90.1 fL (ref 80.0–100.0)
Monocytes Absolute: 0.6 10*3/uL (ref 0.1–1.0)
Monocytes Relative: 6 %
Neutro Abs: 8.7 10*3/uL — ABNORMAL HIGH (ref 1.7–7.7)
Neutrophils Relative %: 80 %
Platelets: 349 10*3/uL (ref 150–400)
RBC: 4.77 MIL/uL (ref 4.22–5.81)
RDW: 12.8 % (ref 11.5–15.5)
WBC: 10.8 10*3/uL — ABNORMAL HIGH (ref 4.0–10.5)
nRBC: 0 % (ref 0.0–0.2)

## 2021-06-01 LAB — COMPREHENSIVE METABOLIC PANEL
ALT: 75 U/L — ABNORMAL HIGH (ref 0–44)
AST: 60 U/L — ABNORMAL HIGH (ref 15–41)
Albumin: 3 g/dL — ABNORMAL LOW (ref 3.5–5.0)
Alkaline Phosphatase: 53 U/L (ref 38–126)
Anion gap: 10 (ref 5–15)
BUN: 22 mg/dL (ref 8–23)
CO2: 26 mmol/L (ref 22–32)
Calcium: 8.7 mg/dL — ABNORMAL LOW (ref 8.9–10.3)
Chloride: 98 mmol/L (ref 98–111)
Creatinine, Ser: 0.79 mg/dL (ref 0.61–1.24)
GFR, Estimated: >60 mL/min (ref >60)
Glucose, Bld: 117 mg/dL — ABNORMAL HIGH (ref 70–99)
Potassium: 3.6 mmol/L (ref 3.5–5.1)
Sodium: 134 mmol/L — ABNORMAL LOW (ref 135–145)
Total Bilirubin: 0.9 mg/dL (ref 0.3–1.2)
Total Protein: 5.8 g/dL — ABNORMAL LOW (ref 6.5–8.1)

## 2021-06-01 LAB — MAGNESIUM: Magnesium: 2.1 mg/dL (ref 1.7–2.4)

## 2021-06-01 LAB — C-REACTIVE PROTEIN: CRP: 1.2 mg/dL — ABNORMAL HIGH (ref <1.0)

## 2021-06-01 LAB — PROCALCITONIN: Procalcitonin: 0.15 ng/mL

## 2021-06-01 LAB — D-DIMER, QUANTITATIVE: D-Dimer, Quant: 0.97 ug/mL-FEU — ABNORMAL HIGH (ref 0.00–0.50)

## 2021-06-01 LAB — PHOSPHORUS: Phosphorus: 3.1 mg/dL (ref 2.5–4.6)

## 2021-06-01 LAB — BRAIN NATRIURETIC PEPTIDE: B Natriuretic Peptide: 155 pg/mL — ABNORMAL HIGH (ref 0.0–100.0)

## 2021-06-01 MED ORDER — SODIUM CHLORIDE 0.9 % IV BOLUS
500.0000 mL | Freq: Once | INTRAVENOUS | Status: AC
  Administered 2021-06-01: 500 mL via INTRAVENOUS

## 2021-06-01 MED ORDER — ENSURE ENLIVE PO LIQD
237.0000 mL | Freq: Three times a day (TID) | ORAL | Status: DC
Start: 2021-06-01 — End: 2021-06-12
  Administered 2021-06-01 – 2021-06-12 (×27): 237 mL via ORAL
  Filled 2021-06-01: qty 237

## 2021-06-01 NOTE — Progress Notes (Signed)
Speech Language Pathology Treatment: Dysphagia  Patient Details Name: Fred Graham MRN: [id number] DOB: 1951/02/17 Today's Date: 06/01/2021 Time: 0810-0825 SLP Time Calculation (min) (ACUTE ONLY): 15 min  Assessment / Plan / Recommendation Clinical Impression  Dysphagia goals addressed - pt alert but disoriented.  He did recall that he was in the hospital and had COVID 19 approx 15 minutes after informed.  Pt willing to assist with dental care - brushing his teeth with hand over hand assist.  After which he helped to self feed with total assist to bring cup to his mouth.  Dysarthria and disorientation continue to impact his swallow ability.  Nectar liquid via tsp, cup, straw, thin via straw, tsp, graham cracker bolus and applesauce provided to him. Cough noted with large straw bolus of nectar and thin boluses - suspect due to discoordination with premature spillage into airway.  Tsps of thin water tolerated without cough reflex.  Cough was not productive but was wet.  SLP posted swallow precaution sign to help guide family *whom RN reports feeds pt* to precautions, recommendations.  Removed thin liquids found in room to nurses computer ([place]) but advised to allow pt tsps of thin water.  Requested RN ask tech to set up oral suction in case needed due to pt's congested cough and mentation.  At this time, do not recommend MBS due to pt's mentation = strict precautions advised and will follow up for MBS indication, family and pt education.    HPI HPI: 70yo male admitted 05/27/21 after being found down at home. Per EMS, family had not heard from the patient in several days and called for welfare check. PMH: HTN, alcohol abuse, depression  Admitted with aspiration pna and COVID 19. CXR 12/13 showed not new findings.  Intake listed as 60 mL only - no meal intake documented.  Pt WBC is 10.8 today but trending down.  Follow up to assure po tolerance indicated and for family education.      SLP Plan     Continue POC     Recommendations for follow up therapy are one component of a multi-disciplinary discharge planning process, led by the attending physician.  Recommendations may be updated based on patient status, additional functional criteria and insurance authorization.    Recommendations  Diet recommendations: Dysphagia 3 (mechanical soft);Nectar-thick liquid (tsps thin water ok) Liquids provided via: Cup;Straw;Teaspoon Medication Administration: Crushed with puree Supervision: Patient able to self feed Compensations: Slow rate;Small sips/bites;Minimize environmental distractions Postural Changes and/or Swallow Maneuvers: Seated upright 90 degrees;Upright 30-60 min after meal                Oral Care Recommendations: Oral care BID Assistance recommended at discharge: Frequent or constant Supervision/Assistance SLP Visit Diagnosis: Dysphagia, unspecified (R13.10)          Rolena Infante, MS [facility] SLP Acute Rehab Services Office [phone] Pager [phone]  Chales Abrahams  06/01/2021, 8:44 AM

## 2021-06-01 NOTE — Plan of Care (Signed)
°  Problem: Safety: Goal: Ability to remain free from injury will improve Outcome: Not Progressing   Problem: Skin Integrity: Goal: Risk for impaired skin integrity will decrease Outcome: Not Progressing   Problem: Activity: Goal: Risk for activity intolerance will decrease Outcome: Not Progressing   Problem: Respiratory: Goal: Ability to maintain adequate ventilation will improve Outcome: Not Progressing

## 2021-06-01 NOTE — Progress Notes (Signed)
Occupational Therapy Treatment Patient Details Name: Fred Graham MRN: [id number] DOB: 06-Aug-1950 Today's Date: 06/01/2021   History of present illness 70 y.o. male with medical history significant of hypertension, alcohol abuse, depression presented to the ED 05/27/21 via EMS after being found down at home at the time of evaluation by law enforcement.  Per EMS, family had not heard from the patient in several days and called for welfare check.Admitted with aspiration pna and COVID 19. CT chest Nondisplaced right anterolateral 5th and 6th rib fractures ETOH. PMH: hypertension, alcohol abuse, depression   OT comments  Pt progressing towards acute OT goals. Remains with impaired cognition, generalized weakness, and decreased activity tolerance impacting basic ADLs. Daughter and son-in-law present throughout session and report pt with improved cognition from yesterday but confirm not at baseline yet. Pt lethargic and at times agitated. Bed placed in chair position. Pt asleep at end of session, family at bedside.   Recommendations for follow up therapy are one component of a multi-disciplinary discharge planning process, led by the attending physician.  Recommendations may be updated based on patient status, additional functional criteria and insurance authorization.    Follow Up Recommendations  Skilled nursing-short term rehab (<3 hours/day)    Assistance Recommended at Discharge Frequent or constant Supervision/Assistance  Equipment Recommendations  Other (comment) (TBD)    Recommendations for Other Services      Precautions / Restrictions Precautions Precautions: Fall Precaution Comments: found down at home Restrictions Weight Bearing Restrictions: No       Mobility Bed Mobility               General bed mobility comments: total A +2 to scoot up in bed.    Transfers                   General transfer comment: unable to safely attempt standing this session      Balance                                           ADL either performed or assessed with clinical judgement   ADL Overall ADL's : Needs assistance/impaired                                       General ADL Comments: Lethargy and AMS impacting basic ADLs. Pt with BLE weakness noted. Not following  multistep commands.    Extremity/Trunk Assessment Upper Extremity Assessment Upper Extremity Assessment: Generalized weakness;Difficult to assess due to impaired cognition   Lower Extremity Assessment Lower Extremity Assessment: Defer to PT evaluation        Vision       Perception     Praxis      Cognition Arousal/Alertness: Lethargic;Suspect due to medications Behavior During Therapy: Agitated;Flat affect Overall Cognitive Status: Impaired/Different from baseline Area of Impairment: Orientation;Attention;Memory;Following commands;Safety/judgement;Awareness;Problem solving                 Orientation Level: Place;Time;Situation Current Attention Level: Focused Memory: Decreased short-term memory;Decreased recall of precautions Following Commands: Follows one step commands inconsistently Safety/Judgement: Decreased awareness of safety;Decreased awareness of deficits Awareness: Intellectual Problem Solving: Slow processing;Decreased initiation;Difficulty sequencing;Requires verbal cues General Comments: Family reports menation is better today than yesterday but not back to baseline. Pt with intermittent moments of alertness then lethargic.  Ativan given last night per RN report.          Exercises     Shoulder Instructions       General Comments      Pertinent Vitals/ Pain       Pain Assessment: Faces Faces Pain Scale: Hurts little more Pain Location: bilateral hips with movement Pain Descriptors / Indicators: Grimacing;Guarding Pain Intervention(s): Monitored during session;Limited activity within patient's  tolerance;Premedicated before session;Repositioned  Home Living                                          Prior Functioning/Environment              Frequency  Min 2X/week        Progress Toward Goals  OT Goals(current goals can now be found in the care plan section)  Progress towards OT goals: Progressing toward goals  Acute Rehab OT Goals Patient Stated Goal: not stated OT Goal Formulation: With patient Time For Goal Achievement: 06/13/21 Potential to Achieve Goals: Fair ADL Goals Pt Will Perform Grooming: with modified independence;sitting Pt Will Perform Upper Body Dressing: with min assist;sitting Pt Will Perform Lower Body Dressing: with min assist;sit to/from stand Pt Will Transfer to Toilet: with mod assist;with +2 assist;bedside commode;stand pivot transfer Pt/caregiver will Perform Home Exercise Program: Increased strength;Both right and left upper extremity;With written HEP provided Additional ADL Goal #1: Pt will follow 1 step commands with 90% accuracy and demonstrate emergent awareness during ADL routine.  Plan Discharge plan remains appropriate    Co-evaluation                 AM-PAC OT "6 Clicks" Daily Activity     Outcome Measure   Help from another person eating meals?: A Lot Help from another person taking care of personal grooming?: A Lot Help from another person toileting, which includes using toliet, bedpan, or urinal?: Total Help from another person bathing (including washing, rinsing, drying)?: A Lot Help from another person to put on and taking off regular upper body clothing?: A Lot Help from another person to put on and taking off regular lower body clothing?: Total 6 Click Score: 10    End of Session    OT Visit Diagnosis: Other abnormalities of gait and mobility (R26.89);Muscle weakness (generalized) (M62.81);Other symptoms and signs involving cognitive function   Activity Tolerance Patient limited by  lethargy;Patient limited by pain;Treatment limited secondary to agitation   Patient Left in bed;with call bell/phone within reach;with family/visitor present;Other (comment) (bed in chair position. family at bedside)   Nurse Communication Other (comment) (cognition/mentation)        Time: 6962-9528 OT Time Calculation (min): 21 min  Charges: OT General Charges $OT Visit: 1 Visit OT Treatments $Therapeutic Activity: 8-22 mins  Raynald Kemp, OT Acute Rehabilitation Services Office: [phone]   Pilar Grammes 06/01/2021, 12:04 PM

## 2021-06-01 NOTE — Progress Notes (Signed)
PROGRESS NOTE                                                                                                                                                                                                             Patient Demographics:    Fred Graham, is a 70 y.o. male, DOB - 1950/12/16, [id number]  Outpatient Primary MD for the patient is Maximiano Coss, NP    LOS - 4  Admit date - 05/27/2021    Chief Complaint  Patient presents with   Altered Mental Status       Brief Narrative (HPI from H&P)    Fred Graham is a 70 y.o. male with medical history significant of hypertension, alcohol abuse, depression presented to the ED via EMS after being found down at home at the time of evaluation by law enforcement.  Per EMS, family had not heard from the patient in several days and called for welfare check.  Police found the patient down on the floor in his underwear, altered, very cold to touch, and did not have heating on at home, was brought to the hospital with confusion, hypothermia, sepsis due to pneumonia and COVID infection.  He was admitted for further care.   Subjective:   No significant events overnight, he did require some Ativan yesterday evening, but nothing since.      Assessment  & Plan :    Severe sepsis due to possible aspiration pneumonia in the setting of COVID-19 infection in a patient who is vaccinated with 2 shots of COVID-vaccine - he is currently on empiric antibiotics which have been titrated down, responding well, also on trial of steroids and remdesivir.  He was treated with vancomycin, cefepime and Flagyl, transitioned to Levaquin and Flagyl,  inflammatory markers are borderline.  Speech to evaluate.  Has been adequately hydrated with IV fluids, sepsis pathophysiology has improved.  Monitor cultures closely, prelim cultures few minutes ago show 3 out of 4 staph epidermis which could be a  contamination, leukocytosis has improved and procalcitonin has trended to normal after empiric Levaquin and Flagyl treatment for aspiration pneumonia.    3/4 blood culture growing Epidermidis -Procalcitonin is reassuring, will follow on repeat blood cultures.  2D echo with no evidence of vegetation. -Surveillance blood cultures obtained on 12/11 with no growth to date  Acute on chronic  hyponatremia.  Worse after hydration electrolytes suggest SIADH, Samsca on 05/29/2021 with improvement repeated dose on 05/30/2021.  Rhabdomyolysis.  Hydrated.  Metobolic encephalopathy. -   Improving with supportive care, no focal deficits head CT stable. -Patient remains more sleepy and lethargic during daytime, I have discontinued his Librium, mentation has improved today, but he still needing as needed Ativan, which I think still keeping him too sleepy, he is already hospitalized for around 5 days, so I will hold his Ativan to see if his mentation improves  Alcohol abuse with DTs .   -Please see above discussion under metabolic encephalopathy  Hypothermia.  Due to combination of sepsis and exposure to elements.  Resolved after supportive care for aspiration pneumonia.  Deconditioning and fall.  PT OT.  Hydrated.  Hypertension.  -Blood pressure is low this morning, will discontinue Norvasc and lisinopril and keep only on Coreg   Mildly elevated transaminases.  Asymptomatic due to rhabdo.  Trend.   Mildly elevated troponin. /2D echo with global hypokinesis and EF of 45% -Given above findings he was placed on aspirin, statin, and beta-blockers, will request cardiology input.   Hypokalemia - replaced.       Condition - Extremely Guarded  Family Communication  : Discussed with daughter at bedside at bedside  Code Status :  Full  Consults  :  None  PUD Prophylaxis : Pepcid   Procedures  :     TTE -   EEG - non acute  CT head and C-spine.  Nonacute.  CTA - Patent three-vessel runoff  bilaterally. Additional ancillary findings as above  CT chest -  Nondisplaced right anterolateral 5th and 6th rib fractures. No pneumothorax is seen. Suspected mild aspiration in the right middle lobe and bilateral lung bases. Aortic Atherosclerosis (ICD10-I70.0).       Disposition Plan  :    Status is: Inpatient  Remains inpatient appropriate because: Sepsis due to pneumonia.  DVT Prophylaxis  :    enoxaparin (LOVENOX) injection 40 mg Start: 05/28/21 1600     Lab Results  Component Value Date   PLT 349 06/01/2021    Diet :  Diet Order             DIET SOFT Room service appropriate? Yes; Fluid consistency: Nectar Thick  Diet effective now                    Inpatient Medications  Scheduled Meds:  aspirin  81 mg Oral Daily   carvedilol  3.125 mg Oral BID WC   chlorhexidine  15 mL Mouth Rinse BID   dexamethasone (DECADRON) injection  6 mg Intravenous Q24H   enoxaparin (LOVENOX) injection  40 mg Subcutaneous Q24H   feeding supplement  237 mL Oral TID BM   folic acid  1 mg Oral Daily   mouth rinse  15 mL Mouth Rinse q12n4p   metroNIDAZOLE  500 mg Oral Q8H   multivitamin with minerals  1 tablet Oral Daily   rosuvastatin  10 mg Oral Daily   thiamine  100 mg Oral Daily   Or   thiamine  100 mg Intravenous Daily   Continuous Infusions:  sodium chloride     PRN Meds:.sodium chloride, lip balm, loperamide  Antibiotics  :    Anti-infectives (From admission, onward)    Start     Dose/Rate Route Frequency Ordered Stop   05/29/21 1000  remdesivir 100 mg in sodium chloride 0.9 % 100 mL IVPB  See Hyperspace for full Linked Orders Report.   100 mg 200 mL/hr over 30 Minutes Intravenous Daily 05/28/21 0530 06/01/21 0944   05/29/21 0200  vancomycin (VANCOREADY) IVPB 2000 mg/400 mL  Status:  Discontinued        2,000 mg 200 mL/hr over 120 Minutes Intravenous Every 24 hours 05/28/21 0538 05/28/21 0942   05/28/21 1100  levofloxacin (LEVAQUIN) tablet 750 mg         750 mg Oral Daily 05/28/21 0943 05/31/21 1325   05/28/21 1100  metroNIDAZOLE (FLAGYL) tablet 500 mg        500 mg Oral Every 8 hours 05/28/21 0943 06/01/21 2359   05/28/21 0800  metroNIDAZOLE (FLAGYL) IVPB 500 mg  Status:  Discontinued        500 mg 100 mL/hr over 60 Minutes Intravenous Every 8 hours 05/28/21 0523 05/28/21 0942   05/28/21 0800  ceFEPIme (MAXIPIME) 2 g in sodium chloride 0.9 % 100 mL IVPB  Status:  Discontinued        2 g 200 mL/hr over 30 Minutes Intravenous Every 8 hours 05/28/21 0538 05/28/21 0943   05/28/21 0530  remdesivir 200 mg in sodium chloride 0.9% 250 mL IVPB       See Hyperspace for full Linked Orders Report.   200 mg 580 mL/hr over 30 Minutes Intravenous Once 05/28/21 0530 05/28/21 0754   05/27/21 2330  ceFEPIme (MAXIPIME) 2 g in sodium chloride 0.9 % 100 mL IVPB        2 g 200 mL/hr over 30 Minutes Intravenous  Once 05/27/21 2315 05/28/21 0205   05/27/21 2330  metroNIDAZOLE (FLAGYL) IVPB 500 mg        500 mg 100 mL/hr over 60 Minutes Intravenous  Once 05/27/21 2315 05/28/21 0205   05/27/21 2330  vancomycin (VANCOREADY) IVPB 2000 mg/400 mL        2,000 mg 200 mL/hr over 120 Minutes Intravenous  Once 05/27/21 2315 05/28/21 0422          Allyssia Skluzacek M.D on 06/01/2021 at 1:49 PM  To page go to [email or web address]   [facility] -  Office  [phone]  See all Orders from today for further details    Objective:   Vitals:   06/01/21 0308 06/01/21 0809 06/01/21 1153 06/01/21 1200  BP: 127/80 138/77 92/61 (!) 84/64  Pulse: 92 93 80 67  Resp: 17 18 20 17   Temp: 98.2 F (36.8 C) 98.4 F (36.9 C) 98.3 F (36.8 C)   TempSrc: Oral Oral Axillary   SpO2: 95% 95% 96% 96%  Weight:      Height:        Wt Readings from Last 3 Encounters:  05/28/21 103.6 kg  05/05/19 93 kg  10/20/18 95.3 kg     Intake/Output Summary (Last 24 hours) at 06/01/2021 1349 Last data filed at 06/01/2021 0618 Gross per 24 hour  Intake 220 ml  Output 1650 ml   Net -1430 ml     Physical Exam  Remains sleepy, but able to open eye and follow some simple commands today, remains significantly confused and altered.   Symmetrical Chest wall movement, Good air movement bilaterally, CTAB RRR,No Gallops,Rubs or new Murmurs, No Parasternal Heave +ve B.Sounds, Abd Soft, No tenderness, No rebound - guarding or rigidity. No Cyanosis, Clubbing or edema, No new Rash or bruise         Data Review:    CBC Recent Labs  Lab 05/27/21 2222 05/27/21 2249 05/28/21 0745 05/29/21 [id number] 05/30/21  [id number] 05/31/21 0225 06/01/21 0040  WBC 25.4*   < > 19.4* 17.1* 15.6* 17.5* 10.8*  HGB 18.0*   < > 13.7 14.8 15.7 15.0 15.1  HCT 49.3   < > 37.9* 41.0 42.2 41.8 43.0  PLT 557*   < > 380 382 304 400 349  MCV 87.9   < > 89.6 88.0 87.6 88.9 90.1  MCH 32.1   < > 32.4 31.8 32.6 31.9 31.7  MCHC 36.5*   < > 36.1* 36.1* 37.2* 35.9 35.1  RDW 12.4   < > 12.7 12.4 12.4 12.5 12.8  LYMPHSABS 1.4  --   --  1.3 1.3 1.5 1.4  MONOABS 1.1*  --   --  0.8 0.5 0.9 0.6  EOSABS 0.0  --   --  0.0 0.0 0.0 0.0  BASOSABS 0.1  --   --  0.0 0.0 0.0 0.0   < > = values in this interval not displayed.    Electrolytes Recent Labs  Lab 05/27/21 2222 05/27/21 2223 05/27/21 2249 05/28/21 0207 05/28/21 0230 05/28/21 0537 05/28/21 0745 05/29/21 0628 05/29/21 1736 05/30/21 0242 05/31/21 0225 06/01/21 0040  NA 125*  --    < >  --    < > 124* 126* 125* 129* 130* 130* 134*  K 3.5  --    < >  --    < > 3.6 4.0 3.2*  --  3.7 3.5 3.6  CL 84*  --   --   --   --  89* 88* 85*  --  94* 94* 98  CO2 26  --   --   --   --  23 25 29   --  25 27 26   GLUCOSE 143*  --   --   --   --  101* 75 91  --  125* 121* 117*  BUN 25*  --   --   --   --  28* 29* 17  --  14 17 22   CREATININE 1.23  --   --   --   --  1.11 1.12 0.76  --  0.84 0.73 0.79  CALCIUM 9.0  --   --   --   --  7.9* 7.6* 8.2*  --  8.6* 8.7* 8.7*  AST 209*  --   --   --   --  162*  --  142*   145*  --  110* 74* 60*  ALT 103*  --   --   --    --  81*  --  87*   89*  --  90* 81* 75*  ALKPHOS 64  --   --   --   --  52  --  64   52  --  48 53 53  BILITOT 1.5*  --   --   --   --  1.0  --  1.0   1.1  --  0.8 0.9 0.9  ALBUMIN 3.6  --   --   --   --  2.8*  --  2.9*   2.9*  --  2.8* 2.9* 3.0*  MG  --   --   --   --    < > 2.0 2.0 2.0  --  2.2 2.1 2.1  CRP  --   --   --   --   --  6.4*  --  6.2*  --  3.8* 1.9* 1.2*  DDIMER  --   --   --   --    < >  0.87* 0.92* 1.51*  --  1.11* 1.16* 0.97*  PROCALCITON  --   --   --   --   --  9.04  --  0.11  --  <0.10 <0.10 0.15  LATICACIDVEN  --  4.3*  --  2.6*  --   --   --   --   --   --   --   --   INR 0.9  --   --   --   --   --   --   --   --   --   --   --   TSH  --   --   --   --   --  2.252  --   --   --   --   --   --   BNP  --   --   --   --   --   --   --  249.7*  --  352.7* 177.9* 155.0*   < > = values in this interval not displayed.    ------------------------------------------------------------------------------------------------------------------ No results for input(s): CHOL, HDL, LDLCALC, TRIG, CHOLHDL, LDLDIRECT in the last 72 hours.  No results found for: HGBA1C  No results for input(s): TSH, T4TOTAL, T3FREE, THYROIDAB in the last 72 hours.  Invalid input(s): FREET3  ------------------------------------------------------------------------------------------------------------------ ID Labs Recent Labs  Lab 05/27/21 2222 05/27/21 2223 05/28/21 0207 05/28/21 0537 05/28/21 0745 05/29/21 0628 05/30/21 0242 05/31/21 0225 06/01/21 0040  WBC  --   --   --  24.2* 19.4* 17.1* 15.6* 17.5* 10.8*  PLT  --   --   --  439* 380 382 304 400 349  CRP  --   --   --  6.4*  --  6.2* 3.8* 1.9* 1.2*  DDIMER   < >  --   --  0.87* 0.92* 1.51* 1.11* 1.16* 0.97*  PROCALCITON  --   --   --  9.04  --  0.11 <0.10 <0.10 0.15  LATICACIDVEN  --  4.3* 2.6*  --   --   --   --   --   --   CREATININE  --   --   --  1.11 1.12 0.76 0.84 0.73 0.79   < > = values in this interval not displayed.   Cardiac  Enzymes No results for input(s): CKMB, TROPONINI, MYOGLOBIN in the last 168 hours.  Invalid input(s): CK      Radiology Reports DG Chest Port 1 View  Result Date: 05/30/2021 CLINICAL DATA:  Shortness of breath, COVID positive EXAM: PORTABLE CHEST 1 VIEW COMPARISON:  05/19/2021 FINDINGS: The heart size and mediastinal contours are within normal limits. Bibasilar scarring and or atelectasis, unchanged. The visualized skeletal structures are unremarkable. IMPRESSION: Bibasilar scarring and or atelectasis, unchanged in AP portable projection. No new airspace opacity. Electronically Signed   By: Delanna Ahmadi M.D.   On: 05/30/2021 08:40   DG Chest Port 1 View  Result Date: 05/29/2021 CLINICAL DATA:  70 year old male with history of shortness of breath. COVID positive patient. EXAM: PORTABLE CHEST 1 VIEW COMPARISON:  Chest x-ray 05/28/2021. FINDINGS: Low lung volumes. Bibasilar opacities which may reflect areas of atelectasis and/or consolidation. Small bilateral pleural effusions. No pneumothorax. No evidence of pulmonary edema. Heart size is normal. Upper mediastinal contours are within normal limits. IMPRESSION: 1. Low lung volumes with bibasilar areas of atelectasis and/or consolidation and small bilateral pleural effusions. Electronically Signed   By: Mauri Brooklyn.D.  On: 05/29/2021 08:03   EEG adult  Result Date: 05/29/2021 Lora Havens, MD     05/29/2021  5:30 PM Patient Name: Fred Graham MRN: [id number] Epilepsy Attending: Lora Havens Referring Physician/Provider: Shela Leff, MD Date: 05/29/2021 Duration: 25.14 mins Patient history: 70yo m with ams. EEG to evaluate for seizure Level of alertness: Asleep AEDs during EEG study: None Technical aspects: This EEG study was done with scalp electrodes positioned according to the 10-20 International system of electrode placement. Electrical activity was acquired at a sampling rate of 500Hz  and reviewed with a high frequency  filter of 70Hz  and a low frequency filter of 1Hz . EEG data were recorded continuously and digitally stored. Description: Sleep was characterized by vertex waves, sleep spindles (12 to 14 Hz), maximal frontocentral region. Hyperventilation and photic stimulation were not performed.   IMPRESSION: This study during sleep only is within normal limits. No seizures or epileptiform discharges were seen throughout the recording. Lora Havens   ECHOCARDIOGRAM COMPLETE  Result Date: 05/30/2021    ECHOCARDIOGRAM REPORT   Patient Name:   Fred Graham Date of Exam: 05/30/2021 Medical Rec #:  [id number]    Height:       74.0 in Accession #:    [id number]   Weight:       228.5 lb Date of Birth:  1950-08-05    BSA:          2.300 m Patient Age:    58 years     BP:           163/89 mmHg Patient Gender: M            HR:           82 bpm. Exam Location:  Inpatient Procedure: 2D Echo, Cardiac Doppler and Color Doppler Indications:    R55 Syncope  History:        Patient has no prior history of Echocardiogram examinations.                 Risk Factors:Hypertension.  Sonographer:    Bernadene Person RDCS Referring Phys: [id number] [place]  1. Left ventricular ejection fraction, by estimation, is 45 to 50%. The left ventricle has mildly decreased function. The left ventricle demonstrates global hypokinesis. Left ventricular diastolic parameters are consistent with Grade I diastolic dysfunction (impaired relaxation).  2. Right ventricular systolic function is normal. The right ventricular size is normal.  3. The mitral valve is normal in structure. No evidence of mitral valve regurgitation. No evidence of mitral stenosis.  4. The aortic valve is normal in structure. Aortic valve regurgitation is not visualized. No aortic stenosis is present.  5. Aortic dilatation noted. There is mild dilatation of the ascending aorta, measuring 40 mm. There is mild dilatation of the aortic root, measuring 38 mm.  6. The inferior  vena cava is normal in size with greater than 50% respiratory variability, suggesting right atrial pressure of 3 mmHg. FINDINGS  Left Ventricle: Left ventricular ejection fraction, by estimation, is 45 to 50%. The left ventricle has mildly decreased function. The left ventricle demonstrates global hypokinesis. The left ventricular internal cavity size was normal in size. There is  no left ventricular hypertrophy. Left ventricular diastolic parameters are consistent with Grade I diastolic dysfunction (impaired relaxation). Normal left ventricular filling pressure. Right Ventricle: The right ventricular size is normal. No increase in right ventricular wall thickness. Right ventricular systolic function is normal. Left Atrium: Left atrial size was normal in  size. Right Atrium: Right atrial size was normal in size. Pericardium: There is no evidence of pericardial effusion. Mitral Valve: The mitral valve is normal in structure. Mild mitral annular calcification. No evidence of mitral valve regurgitation. No evidence of mitral valve stenosis. Tricuspid Valve: The tricuspid valve is normal in structure. Tricuspid valve regurgitation is not demonstrated. No evidence of tricuspid stenosis. Aortic Valve: The aortic valve is normal in structure. Aortic valve regurgitation is not visualized. No aortic stenosis is present. Pulmonic Valve: The pulmonic valve was normal in structure. Pulmonic valve regurgitation is not visualized. No evidence of pulmonic stenosis. Aorta: Aortic dilatation noted. There is mild dilatation of the ascending aorta, measuring 40 mm. There is mild dilatation of the aortic root, measuring 38 mm. Venous: The inferior vena cava is normal in size with greater than 50% respiratory variability, suggesting right atrial pressure of 3 mmHg. IAS/Shunts: No atrial level shunt detected by color flow Doppler.  LEFT VENTRICLE PLAX 2D LVIDd:         3.50 cm      Diastology LVIDs:         2.80 cm      LV e' medial:     6.24 cm/s LV PW:         1.00 cm      LV E/e' medial:  7.3 LV IVS:        1.00 cm      LV e' lateral:   9.89 cm/s LVOT diam:     2.20 cm      LV E/e' lateral: 4.6 LV SV:         63 LV SV Index:   27 LVOT Area:     3.80 cm  LV Volumes (MOD) LV vol d, MOD A2C: 95.3 ml LV vol d, MOD A4C: 115.0 ml LV vol s, MOD A2C: 46.5 ml LV vol s, MOD A4C: 59.5 ml LV SV MOD A2C:     48.8 ml LV SV MOD A4C:     115.0 ml LV SV MOD BP:      52.3 ml RIGHT VENTRICLE RV S prime:     14.70 cm/s TAPSE (M-mode): 1.5 cm LEFT ATRIUM             Index        RIGHT ATRIUM           Index LA diam:        2.50 cm 1.09 cm/m   RA Area:     12.70 cm LA Vol (A2C):   48.6 ml 21.13 ml/m  RA Volume:   24.90 ml  10.83 ml/m LA Vol (A4C):   43.2 ml 18.78 ml/m LA Biplane Vol: 49.1 ml 21.35 ml/m  AORTIC VALVE LVOT Vmax:   95.30 cm/s LVOT Vmean:  75.800 cm/s LVOT VTI:    0.165 m  AORTA Ao Root diam: 3.80 cm Ao Asc diam:  4.00 cm MITRAL VALVE MV Area (PHT): 3.68 cm    SHUNTS MV Decel Time: 206 msec    Systemic VTI:  0.16 m MV E velocity: 45.40 cm/s  Systemic Diam: 2.20 cm MV A velocity: 83.60 cm/s MV E/A ratio:  0.54 Mihai Croitoru MD Electronically signed by Thurmon Fair MD Signature Date/Time: 05/30/2021/3:12:31 PM    Final

## 2021-06-02 LAB — COMPREHENSIVE METABOLIC PANEL
ALT: 68 U/L — ABNORMAL HIGH (ref 0–44)
AST: 46 U/L — ABNORMAL HIGH (ref 15–41)
Albumin: 2.9 g/dL — ABNORMAL LOW (ref 3.5–5.0)
Alkaline Phosphatase: 41 U/L (ref 38–126)
Anion gap: 11 (ref 5–15)
BUN: 31 mg/dL — ABNORMAL HIGH (ref 8–23)
CO2: 24 mmol/L (ref 22–32)
Calcium: 8.5 mg/dL — ABNORMAL LOW (ref 8.9–10.3)
Chloride: 99 mmol/L (ref 98–111)
Creatinine, Ser: 0.82 mg/dL (ref 0.61–1.24)
GFR, Estimated: >60 mL/min (ref >60)
Glucose, Bld: 119 mg/dL — ABNORMAL HIGH (ref 70–99)
Potassium: 3.1 mmol/L — ABNORMAL LOW (ref 3.5–5.1)
Sodium: 134 mmol/L — ABNORMAL LOW (ref 135–145)
Total Bilirubin: 0.6 mg/dL (ref 0.3–1.2)
Total Protein: 5.5 g/dL — ABNORMAL LOW (ref 6.5–8.1)

## 2021-06-02 LAB — CBC
HCT: 42 % (ref 39.0–52.0)
Hemoglobin: 14.6 g/dL (ref 13.0–17.0)
MCH: 31.5 pg (ref 26.0–34.0)
MCHC: 34.8 g/dL (ref 30.0–36.0)
MCV: 90.5 fL (ref 80.0–100.0)
Platelets: 334 10*3/uL (ref 150–400)
RBC: 4.64 MIL/uL (ref 4.22–5.81)
RDW: 13.1 % (ref 11.5–15.5)
WBC: 11.1 10*3/uL — ABNORMAL HIGH (ref 4.0–10.5)
nRBC: 0 % (ref 0.0–0.2)

## 2021-06-02 LAB — MAGNESIUM: Magnesium: 2.1 mg/dL (ref 1.7–2.4)

## 2021-06-02 LAB — PHOSPHORUS: Phosphorus: 3.1 mg/dL (ref 2.5–4.6)

## 2021-06-02 MED ORDER — POTASSIUM CHLORIDE CRYS ER 20 MEQ PO TBCR
40.0000 meq | EXTENDED_RELEASE_TABLET | ORAL | Status: AC
  Administered 2021-06-02 (×2): 40 meq via ORAL
  Filled 2021-06-02 (×2): qty 2

## 2021-06-02 MED ORDER — ACETAMINOPHEN 325 MG PO TABS
650.0000 mg | ORAL_TABLET | Freq: Four times a day (QID) | ORAL | Status: AC | PRN
  Administered 2021-06-02 – 2021-06-07 (×3): 650 mg via ORAL
  Filled 2021-06-02 (×3): qty 2

## 2021-06-02 NOTE — Progress Notes (Signed)
Physical Therapy Treatment Patient Details Name: Fred Graham MRN: [id number] DOB: 1950-12-24 Today's Date: 06/02/2021   History of Present Illness 70 y.o. male with medical history significant of hypertension, alcohol abuse, depression presented to the ED 05/27/21 via EMS after being found down at home at the time of evaluation by law enforcement.  Per EMS, family had not heard from the patient in several days and called for welfare check.Admitted with aspiration pna and COVID 19. CT chest Nondisplaced right anterolateral 5th and 6th rib fractures ETOH. PMH: hypertension, alcohol abuse, depression    PT Comments    Patient received in bed, nursing in room bathing patient. He is requiring +2 for rolling in bed. Slightly agitated with nurses. Patient is agreeable to PT session. He requires +2 max assist for sitting up on side of bed. Min guard for sitting balance. Attempted to stand from elevated bed with +2 max assist but patient unable to assist and was therefore unable to lift bottom from bed. Not using UEs effectively and legs too weak. Patient was able to assist with scooting to head of bed in sitting with mod A. He will continue to benefit from skilled PT while here to improve strength, safety and independence.        Recommendations for follow up therapy are one component of a multi-disciplinary discharge planning process, led by the attending physician.  Recommendations may be updated based on patient status, additional functional criteria and insurance authorization.  Follow Up Recommendations  Skilled nursing-short term rehab (<3 hours/day)     Assistance Recommended at Discharge Frequent or constant Supervision/Assistance  Equipment Recommendations  Other (comment) (TBD)    Recommendations for Other Services       Precautions / Restrictions Precautions Precautions: Fall Precaution Comments: found down at home Restrictions Weight Bearing Restrictions: No     Mobility  Bed  Mobility Overal bed mobility: Needs Assistance Bed Mobility: Supine to Sit;Sit to Supine;Rolling Rolling: Max assist;+2 for physical assistance   Supine to sit: Max assist;+2 for physical assistance Sit to supine: Max assist;+2 for physical assistance        Transfers Overall transfer level: Needs assistance Equipment used: Rolling walker (2 wheels) Transfers: Sit to/from Stand Sit to Stand: From elevated surface;+2 physical assistance;Total assist           General transfer comment: Unable to clear bottom from bed this session    Ambulation/Gait               General Gait Details: unable   Stairs             Wheelchair Mobility    Modified Rankin (Stroke Patients Only)       Balance Overall balance assessment: Needs assistance;History of Falls Sitting-balance support: Feet supported Sitting balance-Leahy Scale: Fair Sitting balance - Comments: requires bilateral UE support; tending to lean forward     Standing balance-Leahy Scale: Zero                              Cognition Arousal/Alertness: Awake/alert Behavior During Therapy: WFL for tasks assessed/performed Overall Cognitive Status: No family/caregiver present to determine baseline cognitive functioning Area of Impairment: Orientation;Awareness;Following commands;Safety/judgement;Problem solving                 Orientation Level: Disoriented to;Time;Situation Current Attention Level: Focused Memory: Decreased short-term memory;Decreased recall of precautions Following Commands: Follows one step commands inconsistently Safety/Judgement: Decreased awareness of safety;Decreased awareness of  deficits Awareness: Intellectual Problem Solving: Slow processing;Decreased initiation;Difficulty sequencing;Requires verbal cues;Requires tactile cues General Comments: Patient able to tell me he's in hospital but not making much sense when he is talking. Stating "i've got to get to my  car."        Exercises      General Comments        Pertinent Vitals/Pain Pain Assessment: Faces Faces Pain Scale: Hurts little more Breathing: normal Negative Vocalization: occasional moan/groan, low speech, negative/disapproving quality Facial Expression: smiling or inexpressive Body Language: tense, distressed pacing, fidgeting Consolability: distracted or reassured by voice/touch PAINAD Score: 3 Pain Location: "all over" Pain Descriptors / Indicators: Grimacing;Moaning Pain Intervention(s): Monitored during session;Repositioned    Home Living                          Prior Function            PT Goals (current goals can now be found in the care plan section) Acute Rehab PT Goals Patient Stated Goal: to go home PT Goal Formulation: With patient Time For Goal Achievement: 06/11/21 Potential to Achieve Goals: Fair Progress towards PT goals: Not progressing toward goals - comment (continues to require max+2 assist for all mobility)    Frequency    Min 2X/week      PT Plan Current plan remains appropriate    Co-evaluation              AM-PAC PT "6 Clicks" Mobility   Outcome Measure  Help needed turning from your back to your side while in a flat bed without using bedrails?: Total Help needed moving from lying on your back to sitting on the side of a flat bed without using bedrails?: Total Help needed moving to and from a bed to a chair (including a wheelchair)?: Total Help needed standing up from a chair using your arms (e.g., wheelchair or bedside chair)?: Total Help needed to walk in hospital room?: Total Help needed climbing 3-5 steps with a railing? : Total 6 Click Score: 6    End of Session Equipment Utilized During Treatment: Gait belt;Oxygen Activity Tolerance: Patient limited by fatigue Patient left: in bed;with call bell/phone within reach;with bed alarm set Nurse Communication: Mobility status PT Visit Diagnosis: Other  abnormalities of gait and mobility (R26.89);Repeated falls (R29.6);Muscle weakness (generalized) (M62.81);History of falling (Z91.81);Difficulty in walking, not elsewhere classified (R26.2);Pain Pain - part of body:  (all over)     Time: 9371-6967 PT Time Calculation (min) (ACUTE ONLY): 18 min  Charges:  $Therapeutic Activity: 8-22 mins                     Sallyann Kinnaird, PT, GCS 06/02/21,11:55 AM

## 2021-06-02 NOTE — TOC Initial Note (Signed)
Transition of Care [facility]) - Initial/Assessment Note    Patient Details  Name: Fred Graham MRN: [id number] Date of Birth: 1950/12/08  [facility]) CM/SW Contact:    Mearl Latin, LCSW Phone Number: 06/02/2021, 2:27 PM  Clinical Narrative:                 CSW received consult for possible SNF placement at time of discharge. CSW spoke with patient's daughter (adoptive). She reported that she is patient's only family and she is currently unable to care for patient given patients current physical needs and fall risk. She expressed understanding of PT recommendation and is agreeable to SNF placement at time of discharge. She reports preference for [facility]. She stated her mother is at [employer] but that she does not want them to be together. CSW discussed insurance authorization process and will provide Medicare SNF ratings list. No COVID vaccines listed in the [place] system. Patient expressed being hopeful for rehab and to feel better soon. No further questions reported at this time.   Skilled Nursing Rehab Facilities-   [email or web address]   Ratings out of 5 possible   Name Address  Phone # Quality Care Staffing Health Inspection Overall  [facility] [address] [place] [phone] 5 5 2 4   [facility]  [address] [place], [place] [phone] 4 2 5 5   [facility] [address] [place], [address] [place] 4 1 1 1   [facility] [address] 2 2 4 4   [facility] [address] [phone] 2 1 1 1   [facility] [phone] [address], [phone] 3 1 4 3   [facility] [address], [address] [place] 5 2 2 3   [facility] [address], [facility] [place] 4 1 2 1   [facility] [address], [facility] 5 1 2 2   [facility] [phone], [phone]  4 1 1 1   [facility] [address], [facility] [phone] 4 1 2 1   [facility]. [address] 4 1 1 1    [facility] [address], [phone] 5 3 3 4   [facility] [place] [address], [place] [phone] 2 1 1 1   [facility] (No [employer]) [address], [address] [place] 2 1 2 1   [facility] [address], [phone] 3 1 1 1       Expected Discharge Plan: Skilled Nursing Facility Barriers to Discharge: SNF Covid, Continued Medical Work up   Patient Goals and CMS Choice Patient states their goals for this hospitalization and ongoing recovery are:: Rehab CMS Medicare.gov Compare Post Acute Care list provided to:: Patient Represenative (must comment) Choice offered to / list presented to : Adult Children  Expected Discharge Plan and Services Expected Discharge Plan: Skilled Nursing Facility In-house Referral: Clinical Social Work   Post Acute Care Choice: Skilled Nursing Facility Living arrangements for the past 2 months: Single Family Home                                      Prior Living Arrangements/Services Living arrangements for the past 2 months: Single Family Home Lives with:: Self Patient language and need for interpreter reviewed:: Yes Do you feel safe going back to the place where you live?: Yes  Need for Family Participation in Patient Care: Yes (Comment) Care giver support system in place?: Yes (comment)   Criminal Activity/Legal Involvement Pertinent to Current Situation/Hospitalization: No - Comment as needed  Activities of Daily Living      Permission Sought/Granted Permission sought to share information with : Facility Medical sales representative, Family Supports Permission granted to share information with : No  Share Information with NAME: [name]  Permission granted to share info w AGENCY: SNFs  Permission granted to share info w  Relationship: Daughter  Permission granted to share info w Contact Information: [phone]  Emotional Assessment   Attitude/Demeanor/Rapport: Unable to Assess Affect (typically observed): Unable to Assess Orientation: : Oriented to Self Alcohol / Substance Use: Not Applicable Psych Involvement: No (comment)  Admission diagnosis:  SOB (shortness of breath) [R06.02] Hypothermia, initial encounter [T68.XXXA] Sepsis (HCC) [A41.9] Severe sepsis (HCC) [A41.9, R65.20] Non-traumatic rhabdomyolysis [M62.82] Sepsis with encephalopathy without septic shock, due to unspecified organism (HCC) [A41.9, R65.20, G93.40] Patient Active Problem List   Diagnosis Date Noted   Pressure injury of skin 06/01/2021   Severe sepsis (HCC) 05/28/2021   COVID-19 virus infection 05/28/2021   AKI (acute kidney injury) (HCC) 05/28/2021   Hyponatremia 05/28/2021   Acute metabolic encephalopathy 05/28/2021   Dysthymia 06/07/2017   Essential hypertension, benign 02/22/2013   PCP:  [name], NP Pharmacy:   [facility] [place] - [address] [place] [address] [address] [place] [address] Phone: [phone] Fax: [phone]  [id number] [place], [place] - [address] AT [facility] [address] [place] [address] Phone: [phone] Fax: [phone]     Social Determinants of Health (SDOH) Interventions    Readmission Risk Interventions No flowsheet data found.

## 2021-06-02 NOTE — NC FL2 (Signed)
[place] MEDICAID FL2 LEVEL OF CARE SCREENING TOOL     IDENTIFICATION  Patient Name: Fred Graham Birthdate: 07/29/1950 Sex: male Admission Date (Current Location): 05/27/2021  [facility] and [place] Number:  Herbalist and Address:  [facility] [place], [place] [address], [place], [place] [address]      Provider Number: [id number]  Attending Physician Name and Address:  Elgergawy, Silver Huguenin, MD  Relative Name and Phone Number:       Current Level of Care: Hospital Recommended Level of Care: [place] Prior Approval Number:    Date Approved/Denied:   PASRR Number: [id number]  Discharge Plan: SNF    Current Diagnoses: Patient Active Problem List   Diagnosis Date Noted   Pressure injury of skin 06/01/2021   Severe sepsis ([place]) 05/28/2021   COVID-19 virus infection 05/28/2021   AKI (acute kidney injury) ([place]) 05/28/2021   Hyponatremia 123456   Acute metabolic encephalopathy 123456   Dysthymia 06/07/2017   Essential hypertension, benign 02/22/2013    Orientation RESPIRATION BLADDER Height & Weight     Self  O2 (Nasal cannula 2L) Incontinent, External catheter Weight: 228 lb 8 oz (103.6 kg) Height:  6\' 2"  (188 cm)  BEHAVIORAL SYMPTOMS/MOOD NEUROLOGICAL BOWEL NUTRITION STATUS      Continent Diet (see dc summary)  AMBULATORY STATUS COMMUNICATION OF NEEDS Skin   Extensive Assist Verbally PU Stage and Appropriate Care (Stage II on sacrum)                       Personal Care Assistance Level of Assistance  Bathing, Feeding, Dressing Bathing Assistance: Maximum assistance Feeding assistance: Limited assistance Dressing Assistance: Limited assistance     Functional Limitations Info             SPECIAL CARE FACTORS FREQUENCY  PT (By licensed PT), OT (By licensed OT)     PT Frequency: 5x/week OT Frequency: 5x/week            Contractures Contractures Info: Not present    Additional Factors Info  Code  Status, Allergies Code Status Info: Full Allergies Info: Penicillins           Current Medications (06/02/2021):  This is the current hospital active medication list Current Facility-Administered Medications  Medication Dose Route Frequency Provider Last Rate Last Admin   0.9 %  sodium chloride infusion   Intravenous PRN Thurnell Lose, MD       aspirin chewable tablet 81 mg  81 mg Oral Daily Thurnell Lose, MD   81 mg at 06/02/21 0919   carvedilol (COREG) tablet 3.125 mg  3.125 mg Oral BID WC Thurnell Lose, MD   3.125 mg at 06/02/21 0919   chlorhexidine (PERIDEX) 0.12 % solution 15 mL  15 mL Mouth Rinse BID Thurnell Lose, MD   15 mL at 06/02/21 0920   dexamethasone (DECADRON) injection 6 mg  6 mg Intravenous Q24H Lala Lund K, MD   6 mg at 06/02/21 1231   enoxaparin (LOVENOX) injection 40 mg  40 mg Subcutaneous Q24H Shela Leff, MD   40 mg at 06/01/21 1714   feeding supplement (ENSURE ENLIVE / ENSURE PLUS) liquid 237 mL  237 mL Oral TID BM Elgergawy, Silver Huguenin, MD   237 mL at [id number]   folic acid (FOLVITE) tablet 1 mg  1 mg Oral Daily Shela Leff, MD   1 mg at 06/02/21 0919   lip balm (CARMEX) ointment  1 application  1 application Topical PRN Leroy Sea, MD       loperamide (IMODIUM) capsule 2 mg  2 mg Oral QID PRN Leroy Sea, MD       MEDLINE mouth rinse  15 mL Mouth Rinse q12n4p Leroy Sea, MD   15 mL at 06/02/21 1231   multivitamin with minerals tablet 1 tablet  1 tablet Oral Daily John Giovanni, MD   1 tablet at 06/02/21 0919   rosuvastatin (CRESTOR) tablet 10 mg  10 mg Oral Daily Leroy Sea, MD   10 mg at 06/02/21 0920   thiamine tablet 100 mg  100 mg Oral Daily John Giovanni, MD   100 mg at 06/02/21 5638   Or   thiamine (B-1) injection 100 mg  100 mg Intravenous Daily John Giovanni, MD         Discharge Medications: Please see discharge summary for a list of discharge medications.  Relevant Imaging  Results:  Relevant Lab Results:   Additional Information SSN: [id number]. No COVID vaccines in system. COVID + on 05/28/21.  Mearl Latin, LCSW

## 2021-06-02 NOTE — Progress Notes (Signed)
At 0236, updated Dr. Leafy Half of potassium of 3.1

## 2021-06-02 NOTE — Plan of Care (Signed)
°  Problem: Clinical Measurements: Goal: Respiratory complications will improve Outcome: Not Progressing   Problem: Elimination: Goal: Will not experience complications related to bowel motility Outcome: Not Progressing   Problem: Safety: Goal: Ability to remain free from injury will improve Outcome: Not Progressing

## 2021-06-02 NOTE — Progress Notes (Signed)
PROGRESS NOTE                                                                                                                                                                                                             Patient Demographics:    Fred Graham, is a 70 y.o. male, DOB - 1950-12-23, [id number]  Outpatient Primary MD for the patient is Maximiano Coss, NP    LOS - 5  Admit date - 05/27/2021    Chief Complaint  Patient presents with   Altered Mental Status       Brief Narrative (HPI from H&P)    Fred Graham is a 70 y.o. male with medical history significant of hypertension, alcohol abuse, depression presented to the ED via EMS after being found down at home at the time of evaluation by law enforcement.  Per EMS, family had not heard from the patient in several days and called for welfare check.  Police found the patient down on the floor in his underwear, altered, very cold to touch, and did not have heating on at home, was brought to the hospital with confusion, hypothermia, sepsis due to pneumonia and COVID infection.  He was admitted for further care.   Subjective:   No significant events overnight, he denies any complaints today, no significant events as discussed with staff.    Assessment  & Plan :    Severe sepsis due to possible aspiration pneumonia in the setting of COVID-19 infection in a patient who is vaccinated with 2 shots of COVID-vaccine - he is currently on empiric antibiotics which have been titrated down, responding well, also on trial of steroids and remdesivir.  He was treated with vancomycin, cefepime and Flagyl, transitioned to Levaquin and Flagyl,  inflammatory markers are borderline.  Speech to evaluate.  Has been adequately hydrated with IV fluids, sepsis pathophysiology has improved.  leukocytosis has improved and procalcitonin has trended to normal after empiric Levaquin and Flagyl  treatment for aspiration pneumonia.    3/4 blood culture growing Epidermidis -Procalcitonin is reassuring, will follow on repeat blood cultures.  2D echo with no evidence of vegetation. -Surveillance blood cultures obtained on 12/11, so far with no growth to date -This is most likely contamination, so far no indication to treat  Acute on chronic hyponatremia.  Worse after hydration electrolytes suggest  SIADH, [place]a on 05/29/2021 with improvement repeated dose on 05/30/2021.  Rhabdomyolysis.  Hydrated.  Metobolic encephalopathy. -   Improving with supportive care, no focal deficits head CT stable. -Patient significantly improved, initially encephalopathic secondary to alcohol withdrawals, has been sleepy over last couple days most likely due to liver protocol and Librium, he is more awake today after they have been discontinued.  Alcohol abuse with DTs .   -Please see above discussion under metabolic encephalopathy  Hypothermia.  Due to combination of sepsis and exposure to elements.  Resolved after supportive care for aspiration pneumonia.  Deconditioning and fall.  PT OT.  Hydrated.  Hypertension.  -Blood pressure is low this morning, will discontinue Norvasc and lisinopril and keep only on Coreg   Mildly elevated transaminases.  Asymptomatic due to rhabdo.  Trend.   Mildly elevated troponin. /2D echo with global hypokinesis and EF of 45% -Given above findings he was placed on aspirin, statin, and beta-blockers, will request cardiology input.   Hypokalemia - replaced.       Condition - Extremely Guarded  Family Communication  : None at bedside today  Code Status :  Full  Consults  :  None  PUD Prophylaxis : Pepcid   Procedures  :     TTE -   EEG - non acute  CT head and C-spine.  Nonacute.  CTA - Patent three-vessel runoff bilaterally. Additional ancillary findings as above  CT chest -  Nondisplaced right anterolateral 5th and 6th rib fractures. No  pneumothorax is seen. Suspected mild aspiration in the right middle lobe and bilateral lung bases. Aortic Atherosclerosis (ICD10-I70.0).       Disposition Plan  :    Status is: Inpatient  Remains inpatient appropriate because: Sepsis due to pneumonia.  DVT Prophylaxis  :    enoxaparin (LOVENOX) injection 40 mg Start: 05/28/21 1600     Lab Results  Component Value Date   PLT 334 06/02/2021    Diet :  Diet Order             DIET SOFT Room service appropriate? Yes; Fluid consistency: Nectar Thick  Diet effective now                    Inpatient Medications  Scheduled Meds:  aspirin  81 mg Oral Daily   carvedilol  3.125 mg Oral BID WC   chlorhexidine  15 mL Mouth Rinse BID   dexamethasone (DECADRON) injection  6 mg Intravenous Q24H   enoxaparin (LOVENOX) injection  40 mg Subcutaneous Q24H   feeding supplement  237 mL Oral TID BM   folic acid  1 mg Oral Daily   mouth rinse  15 mL Mouth Rinse q12n4p   multivitamin with minerals  1 tablet Oral Daily   rosuvastatin  10 mg Oral Daily   thiamine  100 mg Oral Daily   Or   thiamine  100 mg Intravenous Daily   Continuous Infusions:  sodium chloride     PRN Meds:.sodium chloride, lip balm, loperamide  Antibiotics  :    Anti-infectives (From admission, onward)    Start     Dose/Rate Route Frequency Ordered Stop   05/29/21 1000  remdesivir 100 mg in sodium chloride 0.9 % 100 mL IVPB       See Hyperspace for full Linked Orders Report.   100 mg 200 mL/hr over 30 Minutes Intravenous Daily 05/28/21 0530 06/01/21 0944   05/29/21 0200  vancomycin (VANCOREADY) IVPB 2000 mg/400 mL  Status:  Discontinued        2,000 mg 200 mL/hr over 120 Minutes Intravenous Every 24 hours 05/28/21 0538 05/28/21 0942   05/28/21 1100  levofloxacin (LEVAQUIN) tablet 750 mg        750 mg Oral Daily 05/28/21 0943 05/31/21 1325   05/28/21 1100  metroNIDAZOLE (FLAGYL) tablet 500 mg        500 mg Oral Every 8 hours 05/28/21 0943 06/01/21 2359    05/28/21 0800  metroNIDAZOLE (FLAGYL) IVPB 500 mg  Status:  Discontinued        500 mg 100 mL/hr over 60 Minutes Intravenous Every 8 hours 05/28/21 0523 05/28/21 0942   05/28/21 0800  ceFEPIme (MAXIPIME) 2 g in sodium chloride 0.9 % 100 mL IVPB  Status:  Discontinued        2 g 200 mL/hr over 30 Minutes Intravenous Every 8 hours 05/28/21 0538 05/28/21 0943   05/28/21 0530  remdesivir 200 mg in sodium chloride 0.9% 250 mL IVPB       See Hyperspace for full Linked Orders Report.   200 mg 580 mL/hr over 30 Minutes Intravenous Once 05/28/21 0530 05/28/21 0754   05/27/21 2330  ceFEPIme (MAXIPIME) 2 g in sodium chloride 0.9 % 100 mL IVPB        2 g 200 mL/hr over 30 Minutes Intravenous  Once 05/27/21 2315 05/28/21 0205   05/27/21 2330  metroNIDAZOLE (FLAGYL) IVPB 500 mg        500 mg 100 mL/hr over 60 Minutes Intravenous  Once 05/27/21 2315 05/28/21 0205   05/27/21 2330  vancomycin (VANCOREADY) IVPB 2000 mg/400 mL        2,000 mg 200 mL/hr over 120 Minutes Intravenous  Once 05/27/21 2315 05/28/21 0422          Maize Brittingham M.D on 06/02/2021 at 3:37 PM  To page go to [email or web address]   [facility] -  Office  [phone]  See all Orders from today for further details    Objective:   Vitals:   06/02/21 0354 06/02/21 0755 06/02/21 0900 06/02/21 1100  BP: 91/62 (!) 85/62 132/80 (!) 147/81  Pulse: 92 86 84 84  Resp: 20 19 20 16   Temp: 98.3 F (36.8 C) 98 F (36.7 C)  97.9 F (36.6 C)  TempSrc: Oral Oral  Oral  SpO2: 94% 90% 91% 94%  Weight:      Height:        Wt Readings from Last 3 Encounters:  05/28/21 103.6 kg  05/05/19 93 kg  10/20/18 95.3 kg     Intake/Output Summary (Last 24 hours) at 06/02/2021 1537 Last data filed at 06/02/2021 0357 Gross per 24 hour  Intake 573 ml  Output 250 ml  Net 323 ml     Physical Exam  Frail, deconditioned, he is more awake and appropriate today, but remains mildly confused. Symmetrical Chest wall movement, Good air  movement bilaterally, CTAB RRR,No Gallops,Rubs or new Murmurs, No Parasternal Heave +ve B.Sounds, Abd Soft, No tenderness, No rebound - guarding or rigidity. No Cyanosis, Clubbing or edema, No new Rash or bruise         Data Review:    CBC Recent Labs  Lab 05/27/21 2222 05/27/21 2249 05/29/21 0628 05/30/21 0242 05/31/21 0225 06/01/21 0040 06/02/21 0101  WBC 25.4*   < > 17.1* 15.6* 17.5* 10.8* 11.1*  HGB 18.0*   < > 14.8 15.7 15.0 15.1 14.6  HCT 49.3   < > 41.0 42.2 41.8 43.0  42.0  PLT 557*   < > 382 304 400 349 334  MCV 87.9   < > 88.0 87.6 88.9 90.1 90.5  MCH 32.1   < > 31.8 32.6 31.9 31.7 31.5  MCHC 36.5*   < > 36.1* 37.2* 35.9 35.1 34.8  RDW 12.4   < > 12.4 12.4 12.5 12.8 13.1  LYMPHSABS 1.4  --  1.3 1.3 1.5 1.4  --   MONOABS 1.1*  --  0.8 0.5 0.9 0.6  --   EOSABS 0.0  --  0.0 0.0 0.0 0.0  --   BASOSABS 0.1  --  0.0 0.0 0.0 0.0  --    < > = values in this interval not displayed.    Electrolytes Recent Labs  Lab 05/27/21 2222 05/27/21 2223 05/27/21 2249 05/28/21 0207 05/28/21 0230 05/28/21 0537 05/28/21 0745 05/29/21 0628 05/29/21 1736 05/30/21 0242 05/31/21 0225 06/01/21 0040 06/02/21 0101  NA 125*  --    < >  --    < > 124* 126* 125* 129* 130* 130* 134* 134*  K 3.5  --    < >  --    < > 3.6 4.0 3.2*  --  3.7 3.5 3.6 3.1*  CL 84*  --   --   --   --  89* 88* 85*  --  94* 94* 98 99  CO2 26  --   --   --   --  23 25 29   --  25 27 26 24   GLUCOSE 143*  --   --   --   --  101* 75 91  --  125* 121* 117* 119*  BUN 25*  --   --   --   --  28* 29* 17  --  14 17 22  31*  CREATININE 1.23  --   --   --   --  1.11 1.12 0.76  --  0.84 0.73 0.79 0.82  CALCIUM 9.0  --   --   --   --  7.9* 7.6* 8.2*  --  8.6* 8.7* 8.7* 8.5*  AST 209*  --   --   --   --  162*  --  142*   145*  --  110* 74* 60* 46*  ALT 103*  --   --   --   --  81*  --  87*   89*  --  90* 81* 75* 68*  ALKPHOS 64  --   --   --   --  52  --  64   52  --  48 53 53 41  BILITOT 1.5*  --   --   --   --  1.0  --   1.0   1.1  --  0.8 0.9 0.9 0.6  ALBUMIN 3.6  --   --   --   --  2.8*  --  2.9*   2.9*  --  2.8* 2.9* 3.0* 2.9*  MG  --   --   --   --    < > 2.0 2.0 2.0  --  2.2 2.1 2.1 2.1  CRP  --   --   --   --   --  6.4*  --  6.2*  --  3.8* 1.9* 1.2*  --   DDIMER  --   --   --   --    < > 0.87* 0.92* 1.51*  --  1.11* 1.16* 0.97*  --  PROCALCITON  --   --   --   --   --  9.04  --  0.11  --  <0.10 <0.10 0.15  --   LATICACIDVEN  --  4.3*  --  2.6*  --   --   --   --   --   --   --   --   --   INR 0.9  --   --   --   --   --   --   --   --   --   --   --   --   TSH  --   --   --   --   --  2.252  --   --   --   --   --   --   --   BNP  --   --   --   --   --   --   --  249.7*  --  352.7* 177.9* 155.0*  --    < > = values in this interval not displayed.    ------------------------------------------------------------------------------------------------------------------ No results for input(s): CHOL, HDL, LDLCALC, TRIG, CHOLHDL, LDLDIRECT in the last 72 hours.  No results found for: HGBA1C  No results for input(s): TSH, T4TOTAL, T3FREE, THYROIDAB in the last 72 hours.  Invalid input(s): FREET3  ------------------------------------------------------------------------------------------------------------------ ID Labs Recent Labs  Lab 05/27/21 2222 05/27/21 2223 05/28/21 0207 05/28/21 0537 05/28/21 0745 05/29/21 [id number] 05/30/21 0242 05/31/21 0225 06/01/21 0040 06/02/21 0101  WBC  --   --   --  24.2* 19.4* 17.1* 15.6* 17.5* 10.8* 11.1*  PLT  --   --   --  439* 380 382 304 400 349 334  CRP  --   --   --  6.4*  --  6.2* 3.8* 1.9* 1.2*  --   DDIMER   < >  --   --  0.87* 0.92* 1.51* 1.11* 1.16* 0.97*  --   PROCALCITON  --   --   --  9.04  --  0.11 <0.10 <0.10 0.15  --   LATICACIDVEN  --  4.3* 2.6*  --   --   --   --   --   --   --   CREATININE  --   --   --  1.11 1.12 0.76 0.84 0.73 0.79 0.82   < > = values in this interval not displayed.   Cardiac Enzymes No results for input(s): CKMB, TROPONINI,  MYOGLOBIN in the last 168 hours.  Invalid input(s): CK      Radiology Reports DG Chest Port 1 View  Result Date: 05/30/2021 CLINICAL DATA:  Shortness of breath, COVID positive EXAM: PORTABLE CHEST 1 VIEW COMPARISON:  05/19/2021 FINDINGS: The heart size and mediastinal contours are within normal limits. Bibasilar scarring and or atelectasis, unchanged. The visualized skeletal structures are unremarkable. IMPRESSION: Bibasilar scarring and or atelectasis, unchanged in AP portable projection. No new airspace opacity. Electronically Signed   By: Delanna Ahmadi M.D.   On: 05/30/2021 08:40   EEG adult  Result Date: 05/29/2021 Lora Havens, MD     05/29/2021  5:30 PM Patient Name: RANALD BATZ MRN: [id number] Epilepsy Attending: Lora Havens Referring Physician/Provider: Shela Leff, MD Date: 05/29/2021 Duration: 25.14 mins Patient history: 70yo m with ams. EEG to evaluate for seizure Level of alertness: Asleep AEDs during EEG study: None Technical aspects: This EEG study was done with scalp electrodes positioned according to the 10-20  International system of electrode placement. Electrical activity was acquired at a sampling rate of 500Hz  and reviewed with a high frequency filter of 70Hz  and a low frequency filter of 1Hz . EEG data were recorded continuously and digitally stored. Description: Sleep was characterized by vertex waves, sleep spindles (12 to 14 Hz), maximal frontocentral region. Hyperventilation and photic stimulation were not performed.   IMPRESSION: This study during sleep only is within normal limits. No seizures or epileptiform discharges were seen throughout the recording. Lora Havens   ECHOCARDIOGRAM COMPLETE  Result Date: 05/30/2021    ECHOCARDIOGRAM REPORT   Patient Name:   DEANGLEO KUBECKA Molchan Date of Exam: 05/30/2021 Medical Rec #:  [id number]    Height:       74.0 in Accession #:    [id number]   Weight:       228.5 lb Date of Birth:  [birth year]    BSA:          2.300 m  Patient Age:    21 years     BP:           163/89 mmHg Patient Gender: M            HR:           82 bpm. Exam Location:  Inpatient Procedure: 2D Echo, Cardiac Doppler and Color Doppler Indications:    R55 Syncope  History:        Patient has no prior history of Echocardiogram examinations.                 Risk Factors:Hypertension.  Sonographer:    Bernadene Person RDCS Referring Phys: [id number] [place]  1. Left ventricular ejection fraction, by estimation, is 45 to 50%. The left ventricle has mildly decreased function. The left ventricle demonstrates global hypokinesis. Left ventricular diastolic parameters are consistent with Grade I diastolic dysfunction (impaired relaxation).  2. Right ventricular systolic function is normal. The right ventricular size is normal.  3. The mitral valve is normal in structure. No evidence of mitral valve regurgitation. No evidence of mitral stenosis.  4. The aortic valve is normal in structure. Aortic valve regurgitation is not visualized. No aortic stenosis is present.  5. Aortic dilatation noted. There is mild dilatation of the ascending aorta, measuring 40 mm. There is mild dilatation of the aortic root, measuring 38 mm.  6. The inferior vena cava is normal in size with greater than 50% respiratory variability, suggesting right atrial pressure of 3 mmHg. FINDINGS  Left Ventricle: Left ventricular ejection fraction, by estimation, is 45 to 50%. The left ventricle has mildly decreased function. The left ventricle demonstrates global hypokinesis. The left ventricular internal cavity size was normal in size. There is  no left ventricular hypertrophy. Left ventricular diastolic parameters are consistent with Grade I diastolic dysfunction (impaired relaxation). Normal left ventricular filling pressure. Right Ventricle: The right ventricular size is normal. No increase in right ventricular wall thickness. Right ventricular systolic function is normal. Left Atrium:  Left atrial size was normal in size. Right Atrium: Right atrial size was normal in size. Pericardium: There is no evidence of pericardial effusion. Mitral Valve: The mitral valve is normal in structure. Mild mitral annular calcification. No evidence of mitral valve regurgitation. No evidence of mitral valve stenosis. Tricuspid Valve: The tricuspid valve is normal in structure. Tricuspid valve regurgitation is not demonstrated. No evidence of tricuspid stenosis. Aortic Valve: The aortic valve is normal in structure. Aortic valve regurgitation is not visualized. No aortic  stenosis is present. Pulmonic Valve: The pulmonic valve was normal in structure. Pulmonic valve regurgitation is not visualized. No evidence of pulmonic stenosis. Aorta: Aortic dilatation noted. There is mild dilatation of the ascending aorta, measuring 40 mm. There is mild dilatation of the aortic root, measuring 38 mm. Venous: The inferior vena cava is normal in size with greater than 50% respiratory variability, suggesting right atrial pressure of 3 mmHg. IAS/Shunts: No atrial level shunt detected by color flow Doppler.  LEFT VENTRICLE PLAX 2D LVIDd:         3.50 cm      Diastology LVIDs:         2.80 cm      LV e' medial:    6.24 cm/s LV PW:         1.00 cm      LV E/e' medial:  7.3 LV IVS:        1.00 cm      LV e' lateral:   9.89 cm/s LVOT diam:     2.20 cm      LV E/e' lateral: 4.6 LV SV:         63 LV SV Index:   27 LVOT Area:     3.80 cm  LV Volumes (MOD) LV vol d, MOD A2C: 95.3 ml LV vol d, MOD A4C: 115.0 ml LV vol s, MOD A2C: 46.5 ml LV vol s, MOD A4C: 59.5 ml LV SV MOD A2C:     48.8 ml LV SV MOD A4C:     115.0 ml LV SV MOD BP:      52.3 ml RIGHT VENTRICLE RV S prime:     14.70 cm/s TAPSE (M-mode): 1.5 cm LEFT ATRIUM             Index        RIGHT ATRIUM           Index LA diam:        2.50 cm 1.09 cm/m   RA Area:     12.70 cm LA Vol (A2C):   48.6 ml 21.13 ml/m  RA Volume:   24.90 ml  10.83 ml/m LA Vol (A4C):   43.2 ml 18.78 ml/m LA  Biplane Vol: 49.1 ml 21.35 ml/m  AORTIC VALVE LVOT Vmax:   95.30 cm/s LVOT Vmean:  75.800 cm/s LVOT VTI:    0.165 m  AORTA Ao Root diam: 3.80 cm Ao Asc diam:  4.00 cm MITRAL VALVE MV Area (PHT): 3.68 cm    SHUNTS MV Decel Time: 206 msec    Systemic VTI:  0.16 m MV E velocity: 45.40 cm/s  Systemic Diam: 2.20 cm MV A velocity: 83.60 cm/s MV E/A ratio:  0.54 Mihai Croitoru MD Electronically signed by Sanda Klein MD Signature Date/Time: 05/30/2021/3:12:31 PM    Final

## 2021-06-03 LAB — BASIC METABOLIC PANEL
Anion gap: 8 (ref 5–15)
BUN: 28 mg/dL — ABNORMAL HIGH (ref 8–23)
CO2: 25 mmol/L (ref 22–32)
Calcium: 8.3 mg/dL — ABNORMAL LOW (ref 8.9–10.3)
Chloride: 99 mmol/L (ref 98–111)
Creatinine, Ser: 0.73 mg/dL (ref 0.61–1.24)
GFR, Estimated: >60 mL/min (ref >60)
Glucose, Bld: 116 mg/dL — ABNORMAL HIGH (ref 70–99)
Potassium: 3.7 mmol/L (ref 3.5–5.1)
Sodium: 132 mmol/L — ABNORMAL LOW (ref 135–145)

## 2021-06-03 LAB — PHOSPHORUS: Phosphorus: 2.9 mg/dL (ref 2.5–4.6)

## 2021-06-03 LAB — VITAMIN B12: Vitamin B-12: 1493 pg/mL — ABNORMAL HIGH (ref 180–914)

## 2021-06-03 MED ORDER — POTASSIUM CHLORIDE IN NACL 20-0.9 MEQ/L-% IV SOLN
INTRAVENOUS | Status: DC
Start: 2021-06-03 — End: 2021-06-03

## 2021-06-03 MED ORDER — SODIUM CHLORIDE 0.9 % IV SOLN: INTRAVENOUS | Status: DC

## 2021-06-03 MED ORDER — K PHOS MONO-SOD PHOS DI & MONO 155-852-130 MG PO TABS
250.0000 mg | ORAL_TABLET | Freq: Two times a day (BID) | ORAL | Status: AC
  Administered 2021-06-03 – 2021-06-04 (×4): 250 mg via ORAL
  Filled 2021-06-03 (×4): qty 1

## 2021-06-03 MED ORDER — SODIUM CHLORIDE 0.9 % IV BOLUS
250.0000 mL | Freq: Once | INTRAVENOUS | Status: AC
  Administered 2021-06-03: 250 mL via INTRAVENOUS

## 2021-06-03 NOTE — Progress Notes (Signed)
PROGRESS NOTE                                                                                                                                                                                                             Patient Demographics:    Fred Graham, is a 70 y.o. male, DOB - [birth year], [id number]  Outpatient Primary MD for the patient is Maximiano Coss, NP    LOS - 6  Admit date - 05/27/2021    Chief Complaint  Patient presents with   Altered Mental Status       Brief Narrative (HPI from H&P)    Fred Graham is a 70 y.o. male with medical history significant of hypertension, alcohol abuse, depression presented to the ED via EMS after being found down at home at the time of evaluation by law enforcement.  Per EMS, family had not heard from the patient in several days and called for welfare check.  Police found the patient down on the floor in his underwear, altered, very cold to touch, and did not have heating on at home, was brought to the hospital with confusion, hypothermia, sepsis due to pneumonia and COVID infection.  He was admitted for further care.   Subjective:   No significant events overnight as discussed with staff, patient himself denies any complaints, overall appetite is poor, but he would eat with some encouragement, but he is having poor fluid intake.       Assessment  & Plan :    Severe sepsis due to possible aspiration pneumonia in the setting of COVID-19 infection in a patient who is vaccinated with 2 shots of COVID-vaccine - treated with  empiric antibiotics which have been titrated down  He was treated with vancomycin, cefepime and Flagyl, transitioned to Levaquin and Flagyl,  inflammatory markers are borderline.   - responding well, also on trial of steroids and remdesivir. -  sepsis pathophysiology has improved.  leukocytosis has improved and procalcitonin has trended to normal .   3/4  blood culture growing Epidermidis -Procalcitonin is reassuring, will follow on repeat blood cultures.  2D echo with no evidence of vegetation. -Surveillance blood cultures obtained on 12/11, so far with no growth to date -This is most likely contamination, so far no indication to treat  Acute on chronic hyponatremia.   - Worse after  hydration electrolytes suggest SIADH, Samsca on 05/29/2021 with improvement repeated dose on 05/30/2021.  Rhabdomyolysis.   - Hydrated.  Metobolic encephalopathy. -   Improving with supportive care, no focal deficits head CT stable. -Patient significantly improved, initially encephalopathic secondary to alcohol withdrawals, has been sleepy over last couple days most likely due to liver protocol and Librium, he is more awake today after they have been discontinued. -Mentation has improved, he is more awake and alert, but remains significantly confused -Check B12, TSH within normal limit.  Continue with folate acid supplements  Alcohol abuse with DTs .   -Please see above discussion under metabolic encephalopathy  Hypothermia.  -  Due to combination of sepsis and exposure to elements.  Resolved after supportive care for aspiration pneumonia.  Deconditioning and fall.  PT OT.  Hydrated.  Chronic systolic heart failure with EF 45 to 50% -No evidence of volume overload during this hospital stay, EF 45 to 50%, neurology input greatly appreciated -Given his soft blood pressure we have to hold his lisinopril and Coreg.  Hypertension/hypotension -Overall blood pressure has been soft, so Norvasc and lisinopril has been discontinued, blood pressure is low today as well, so Coreg has been discontinued, and he is resumed back on IV fluids .  Mildly elevated transaminases.   -Likely due to alcohol abuse, trending down  Mildly elevated troponin.  -Cardiology input greatly appreciated, this is secondary to demand ischemia in the setting of critical  illness.  Hypokalemia - replaced.        Condition - Extremely Guarded  Family Communication  : D/W daughter by phone.  Code Status :  Full  Consults  :  None  PUD Prophylaxis : Pepcid   Procedures  :     TTE -   EEG - non acute  CT head and C-spine.  Nonacute.  CTA - Patent three-vessel runoff bilaterally. Additional ancillary findings as above  CT chest -  Nondisplaced right anterolateral 5th and 6th rib fractures. No pneumothorax is seen. Suspected mild aspiration in the right middle lobe and bilateral lung bases. Aortic Atherosclerosis (ICD10-I70.0).       Disposition Plan  :    Status is: Inpatient  Remains inpatient appropriate because: Sepsis due to pneumonia.  DVT Prophylaxis  :    enoxaparin (LOVENOX) injection 40 mg Start: 05/28/21 1600     Lab Results  Component Value Date   PLT 334 06/02/2021    Diet :  Diet Order             DIET SOFT Room service appropriate? Yes; Fluid consistency: Nectar Thick  Diet effective now                    Inpatient Medications  Scheduled Meds:  aspirin  81 mg Oral Daily   carvedilol  3.125 mg Oral BID WC   chlorhexidine  15 mL Mouth Rinse BID   enoxaparin (LOVENOX) injection  40 mg Subcutaneous Q24H   feeding supplement  237 mL Oral TID BM   folic acid  1 mg Oral Daily   mouth rinse  15 mL Mouth Rinse q12n4p   multivitamin with minerals  1 tablet Oral Daily   rosuvastatin  10 mg Oral Daily   thiamine  100 mg Oral Daily   Or   thiamine  100 mg Intravenous Daily   Continuous Infusions:  sodium chloride     sodium chloride     sodium chloride     PRN Meds:.sodium  chloride, acetaminophen, lip balm, loperamide  Antibiotics  :    Anti-infectives (From admission, onward)    Start     Dose/Rate Route Frequency Ordered Stop   05/29/21 1000  remdesivir 100 mg in sodium chloride 0.9 % 100 mL IVPB       See Hyperspace for full Linked Orders Report.   100 mg 200 mL/hr over 30 Minutes Intravenous  Daily 05/28/21 0530 06/01/21 0944   05/29/21 0200  vancomycin (VANCOREADY) IVPB 2000 mg/400 mL  Status:  Discontinued        2,000 mg 200 mL/hr over 120 Minutes Intravenous Every 24 hours 05/28/21 0538 05/28/21 0942   05/28/21 1100  levofloxacin (LEVAQUIN) tablet 750 mg        750 mg Oral Daily 05/28/21 0943 05/31/21 1325   05/28/21 1100  metroNIDAZOLE (FLAGYL) tablet 500 mg        500 mg Oral Every 8 hours 05/28/21 0943 06/01/21 2359   05/28/21 0800  metroNIDAZOLE (FLAGYL) IVPB 500 mg  Status:  Discontinued        500 mg 100 mL/hr over 60 Minutes Intravenous Every 8 hours 05/28/21 0523 05/28/21 0942   05/28/21 0800  ceFEPIme (MAXIPIME) 2 g in sodium chloride 0.9 % 100 mL IVPB  Status:  Discontinued        2 g 200 mL/hr over 30 Minutes Intravenous Every 8 hours 05/28/21 0538 05/28/21 0943   05/28/21 0530  remdesivir 200 mg in sodium chloride 0.9% 250 mL IVPB       See Hyperspace for full Linked Orders Report.   200 mg 580 mL/hr over 30 Minutes Intravenous Once 05/28/21 0530 05/28/21 0754   05/27/21 2330  ceFEPIme (MAXIPIME) 2 g in sodium chloride 0.9 % 100 mL IVPB        2 g 200 mL/hr over 30 Minutes Intravenous  Once 05/27/21 2315 05/28/21 0205   05/27/21 2330  metroNIDAZOLE (FLAGYL) IVPB 500 mg        500 mg 100 mL/hr over 60 Minutes Intravenous  Once 05/27/21 2315 05/28/21 0205   05/27/21 2330  vancomycin (VANCOREADY) IVPB 2000 mg/400 mL        2,000 mg 200 mL/hr over 120 Minutes Intravenous  Once 05/27/21 2315 05/28/21 0422          Rickia Freeburg M.D on 06/03/2021 at 1:30 PM  To page go to [email or web address]   [facility] -  Office  [phone]  See all Orders from today for further details    Objective:   Vitals:   06/03/21 0322 06/03/21 0829 06/03/21 1200 06/03/21 1208  BP:  119/79 (!) 77/53 (!) 74/55  Pulse:  80 87 88  Resp:  18 18 16   Temp: 98.5 F (36.9 C) 97.6 F (36.4 C)  97.6 F (36.4 C)  TempSrc: Axillary Oral  Oral  SpO2:  96%  90%  Weight:       Height:        Wt Readings from Last 3 Encounters:  05/28/21 103.6 kg  05/05/19 93 kg  10/20/18 95.3 kg     Intake/Output Summary (Last 24 hours) at 06/03/2021 1330 Last data filed at 06/03/2021 1205 Gross per 24 hour  Intake --  Output 1450 ml  Net -1450 ml     Physical Exam  Awake Alert, recently confused, extremely weak and frail.   Symmetrical Chest wall movement, Good air movement bilaterally, CTAB RRR,No Gallops,Rubs or new Murmurs, No Parasternal Heave +ve B.Sounds, Abd Soft, No tenderness, No rebound -  guarding or rigidity. No Cyanosis, Clubbing or edema, No new Rash or bruise         Data Review:    CBC Recent Labs  Lab 05/27/21 2222 05/27/21 2249 05/29/21 0628 05/30/21 0242 05/31/21 0225 06/01/21 0040 06/02/21 0101  WBC 25.4*   < > 17.1* 15.6* 17.5* 10.8* 11.1*  HGB 18.0*   < > 14.8 15.7 15.0 15.1 14.6  HCT 49.3   < > 41.0 42.2 41.8 43.0 42.0  PLT 557*   < > 382 304 400 349 334  MCV 87.9   < > 88.0 87.6 88.9 90.1 90.5  MCH 32.1   < > 31.8 32.6 31.9 31.7 31.5  MCHC 36.5*   < > 36.1* 37.2* 35.9 35.1 34.8  RDW 12.4   < > 12.4 12.4 12.5 12.8 13.1  LYMPHSABS 1.4  --  1.3 1.3 1.5 1.4  --   MONOABS 1.1*  --  0.8 0.5 0.9 0.6  --   EOSABS 0.0  --  0.0 0.0 0.0 0.0  --   BASOSABS 0.1  --  0.0 0.0 0.0 0.0  --    < > = values in this interval not displayed.    Electrolytes Recent Labs  Lab 05/27/21 2222 05/27/21 2223 05/27/21 2249 05/28/21 0207 05/28/21 0230 05/28/21 0537 05/28/21 0745 05/29/21 0628 05/29/21 1736 05/30/21 0242 05/31/21 0225 06/01/21 0040 06/02/21 0101 06/03/21 0143  NA 125*  --    < >  --    < > 124* 126* 125*   < > 130* 130* 134* 134* 132*  K 3.5  --    < >  --    < > 3.6 4.0 3.2*  --  3.7 3.5 3.6 3.1* 3.7  CL 84*  --   --   --   --  89* 88* 85*  --  94* 94* 98 99 99  CO2 26  --   --   --   --  23 25 29   --  25 27 26 24 25   GLUCOSE 143*  --   --   --   --  101* 75 91  --  125* 121* 117* 119* 116*  BUN 25*  --   --   --    --  28* 29* 17  --  14 17 22  31* 28*  CREATININE 1.23  --   --   --   --  1.11 1.12 0.76  --  0.84 0.73 0.79 0.82 0.73  CALCIUM 9.0  --   --   --   --  7.9* 7.6* 8.2*  --  8.6* 8.7* 8.7* 8.5* 8.3*  AST 209*  --   --   --   --  162*  --  142*   145*  --  110* 74* 60* 46*  --   ALT 103*  --   --   --   --  81*  --  87*   89*  --  90* 81* 75* 68*  --   ALKPHOS 64  --   --   --   --  52  --  64   52  --  48 53 53 41  --   BILITOT 1.5*  --   --   --   --  1.0  --  1.0   1.1  --  0.8 0.9 0.9 0.6  --   ALBUMIN 3.6  --   --   --   --  2.8*  --  2.9*   2.9*  --  2.8* 2.9* 3.0* 2.9*  --   MG  --   --   --   --    < > 2.0 2.0 2.0  --  2.2 2.1 2.1 2.1  --   CRP  --   --   --   --   --  6.4*  --  6.2*  --  3.8* 1.9* 1.2*  --   --   DDIMER  --   --   --   --    < > 0.87* 0.92* 1.51*  --  1.11* 1.16* 0.97*  --   --   PROCALCITON  --   --   --   --   --  9.04  --  0.11  --  <0.10 <0.10 0.15  --   --   LATICACIDVEN  --  4.3*  --  2.6*  --   --   --   --   --   --   --   --   --   --   INR 0.9  --   --   --   --   --   --   --   --   --   --   --   --   --   TSH  --   --   --   --   --  2.252  --   --   --   --   --   --   --   --   BNP  --   --   --   --   --   --   --  249.7*  --  352.7* 177.9* 155.0*  --   --    < > = values in this interval not displayed.    ------------------------------------------------------------------------------------------------------------------ No results for input(s): CHOL, HDL, LDLCALC, TRIG, CHOLHDL, LDLDIRECT in the last 72 hours.  No results found for: HGBA1C  No results for input(s): TSH, T4TOTAL, T3FREE, THYROIDAB in the last 72 hours.  Invalid input(s): FREET3  ------------------------------------------------------------------------------------------------------------------ ID Labs Recent Labs  Lab 05/27/21 2222 05/27/21 2223 05/28/21 0207 05/28/21 0537 05/28/21 0745 05/29/21 6433 05/30/21 0242 05/31/21 0225 06/01/21 0040 06/02/21 0101 06/03/21 0143   WBC  --   --   --  24.2* 19.4* 17.1* 15.6* 17.5* 10.8* 11.1*  --   PLT  --   --   --  439* 380 382 304 400 349 334  --   CRP  --   --   --  6.4*  --  6.2* 3.8* 1.9* 1.2*  --   --   DDIMER   < >  --   --  0.87* 0.92* 1.51* 1.11* 1.16* 0.97*  --   --   PROCALCITON  --   --   --  9.04  --  0.11 <0.10 <0.10 0.15  --   --   LATICACIDVEN  --  4.3* 2.6*  --   --   --   --   --   --   --   --   CREATININE  --   --   --  1.11 1.12 0.76 0.84 0.73 0.79 0.82 0.73   < > = values in this interval not displayed.   Cardiac Enzymes No results for input(s): CKMB, TROPONINI, MYOGLOBIN in the last 168 hours.  Invalid input(s): CK      Radiology Reports ECHOCARDIOGRAM  COMPLETE  Result Date: 05/30/2021    ECHOCARDIOGRAM REPORT   Patient Name:   Fred Graham Date of Exam: 05/30/2021 Medical Rec #:  [id number]    Height:       74.0 in Accession #:    [id number]   Weight:       228.5 lb Date of Birth:  July 29, 1950    BSA:          2.300 m Patient Age:    59 years     BP:           163/89 mmHg Patient Gender: M            HR:           82 bpm. Exam Location:  Inpatient Procedure: 2D Echo, Cardiac Doppler and Color Doppler Indications:    R55 Syncope  History:        Patient has no prior history of Echocardiogram examinations.                 Risk Factors:Hypertension.  Sonographer:    Bernadene Person RDCS Referring Phys: [id number] [employer]  1. Left ventricular ejection fraction, by estimation, is 45 to 50%. The left ventricle has mildly decreased function. The left ventricle demonstrates global hypokinesis. Left ventricular diastolic parameters are consistent with Grade I diastolic dysfunction (impaired relaxation).  2. Right ventricular systolic function is normal. The right ventricular size is normal.  3. The mitral valve is normal in structure. No evidence of mitral valve regurgitation. No evidence of mitral stenosis.  4. The aortic valve is normal in structure. Aortic valve regurgitation is not  visualized. No aortic stenosis is present.  5. Aortic dilatation noted. There is mild dilatation of the ascending aorta, measuring 40 mm. There is mild dilatation of the aortic root, measuring 38 mm.  6. The inferior vena cava is normal in size with greater than 50% respiratory variability, suggesting right atrial pressure of 3 mmHg. FINDINGS  Left Ventricle: Left ventricular ejection fraction, by estimation, is 45 to 50%. The left ventricle has mildly decreased function. The left ventricle demonstrates global hypokinesis. The left ventricular internal cavity size was normal in size. There is  no left ventricular hypertrophy. Left ventricular diastolic parameters are consistent with Grade I diastolic dysfunction (impaired relaxation). Normal left ventricular filling pressure. Right Ventricle: The right ventricular size is normal. No increase in right ventricular wall thickness. Right ventricular systolic function is normal. Left Atrium: Left atrial size was normal in size. Right Atrium: Right atrial size was normal in size. Pericardium: There is no evidence of pericardial effusion. Mitral Valve: The mitral valve is normal in structure. Mild mitral annular calcification. No evidence of mitral valve regurgitation. No evidence of mitral valve stenosis. Tricuspid Valve: The tricuspid valve is normal in structure. Tricuspid valve regurgitation is not demonstrated. No evidence of tricuspid stenosis. Aortic Valve: The aortic valve is normal in structure. Aortic valve regurgitation is not visualized. No aortic stenosis is present. Pulmonic Valve: The pulmonic valve was normal in structure. Pulmonic valve regurgitation is not visualized. No evidence of pulmonic stenosis. Aorta: Aortic dilatation noted. There is mild dilatation of the ascending aorta, measuring 40 mm. There is mild dilatation of the aortic root, measuring 38 mm. Venous: The inferior vena cava is normal in size with greater than 50% respiratory variability,  suggesting right atrial pressure of 3 mmHg. IAS/Shunts: No atrial level shunt detected by color flow Doppler.  LEFT VENTRICLE PLAX 2D LVIDd:  3.50 cm      Diastology LVIDs:         2.80 cm      LV e' medial:    6.24 cm/s LV PW:         1.00 cm      LV E/e' medial:  7.3 LV IVS:        1.00 cm      LV e' lateral:   9.89 cm/s LVOT diam:     2.20 cm      LV E/e' lateral: 4.6 LV SV:         63 LV SV Index:   27 LVOT Area:     3.80 cm  LV Volumes (MOD) LV vol d, MOD A2C: 95.3 ml LV vol d, MOD A4C: 115.0 ml LV vol s, MOD A2C: 46.5 ml LV vol s, MOD A4C: 59.5 ml LV SV MOD A2C:     48.8 ml LV SV MOD A4C:     115.0 ml LV SV MOD BP:      52.3 ml RIGHT VENTRICLE RV S prime:     14.70 cm/s TAPSE (M-mode): 1.5 cm LEFT ATRIUM             Index        RIGHT ATRIUM           Index LA diam:        2.50 cm 1.09 cm/m   RA Area:     12.70 cm LA Vol (A2C):   48.6 ml 21.13 ml/m  RA Volume:   24.90 ml  10.83 ml/m LA Vol (A4C):   43.2 ml 18.78 ml/m LA Biplane Vol: 49.1 ml 21.35 ml/m  AORTIC VALVE LVOT Vmax:   95.30 cm/s LVOT Vmean:  75.800 cm/s LVOT VTI:    0.165 m  AORTA Ao Root diam: 3.80 cm Ao Asc diam:  4.00 cm MITRAL VALVE MV Area (PHT): 3.68 cm    SHUNTS MV Decel Time: 206 msec    Systemic VTI:  0.16 m MV E velocity: 45.40 cm/s  Systemic Diam: 2.20 cm MV A velocity: 83.60 cm/s MV E/A ratio:  0.54 Mihai Croitoru MD Electronically signed by Sanda Klein MD Signature Date/Time: 05/30/2021/3:12:31 PM    Final

## 2021-06-03 NOTE — Plan of Care (Signed)
Remains delirious more often than not. IV consult per standing orders as patient continues to self-remove PIVs. Initiated q 2 turns per braden scale/protocol. Incontinence of urine on going.   Problem: Education: Goal: Knowledge of General Education information will improve Description: Including pain rating scale, medication(s)/side effects and non-pharmacologic comfort measures Outcome: Not Progressing   Problem: Health Behavior/Discharge Planning: Goal: Ability to manage health-related needs will improve Outcome: Not Progressing   Problem: Clinical Measurements: Goal: Ability to maintain clinical measurements within normal limits will improve Outcome: Not Progressing Goal: Will remain free from infection Outcome: Not Progressing Goal: Diagnostic test results will improve Outcome: Not Progressing Goal: Respiratory complications will improve Outcome: Not Progressing Goal: Cardiovascular complication will be avoided Outcome: Not Progressing   Problem: Activity: Goal: Risk for activity intolerance will decrease Outcome: Not Progressing   Problem: Nutrition: Goal: Adequate nutrition will be maintained Outcome: Not Progressing   Problem: Coping: Goal: Level of anxiety will decrease Outcome: Not Progressing   Problem: Elimination: Goal: Will not experience complications related to bowel motility Outcome: Not Progressing Goal: Will not experience complications related to urinary retention Outcome: Not Progressing   Problem: Pain Managment: Goal: General experience of comfort will improve Outcome: Not Progressing   Problem: Safety: Goal: Ability to remain free from injury will improve Outcome: Not Progressing   Problem: Skin Integrity: Goal: Risk for impaired skin integrity will decrease Outcome: Not Progressing   Problem: Education: Goal: Knowledge of risk factors and measures for prevention of condition will improve Outcome: Not Progressing   Problem:  Coping: Goal: Psychosocial and spiritual needs will be supported Outcome: Not Progressing   Problem: Respiratory: Goal: Will maintain a patent airway Outcome: Not Progressing Goal: Complications related to the disease process, condition or treatment will be avoided or minimized Outcome: Not Progressing   Problem: Fluid Volume: Goal: Hemodynamic stability will improve Outcome: Not Progressing   Problem: Clinical Measurements: Goal: Diagnostic test results will improve Outcome: Not Progressing Goal: Signs and symptoms of infection will decrease Outcome: Not Progressing   Problem: Respiratory: Goal: Ability to maintain adequate ventilation will improve Outcome: Not Progressing

## 2021-06-03 NOTE — Progress Notes (Signed)
°   06/03/21 1208  Assess: MEWS Score  Temp 97.6 F (36.4 C)  BP (!) 74/55  Pulse Rate 88  ECG Heart Rate 86  Resp 16  Level of Consciousness Alert  SpO2 90 %  O2 Device Nasal Cannula  O2 Flow Rate (L/min) 2 L/min  Assess: MEWS Score  MEWS Temp 0  MEWS Systolic 2  MEWS Pulse 0  MEWS RR 0  MEWS LOC 0  MEWS Score 2  MEWS Score Color Yellow  Assess: if the MEWS score is Yellow or Red  Were vital signs taken at a resting state? Yes  Focused Assessment No change from prior assessment  Early Detection of Sepsis Score *See Row Information* Low  MEWS guidelines implemented *See Row Information* Yes  Treat  MEWS Interventions Other (Comment) (alerted MD once aware of yellow MEWS at 1230)  Pain Scale 0-10  Pain Score 0  Take Vital Signs  Increase Vital Sign Frequency  Yellow: Q 2hr X 2 then Q 4hr X 2, if remains yellow, continue Q 4hrs  Escalate  MEWS: Escalate Yellow: discuss with charge nurse/RN and consider discussing with provider and RRT  Notify: Charge Nurse/RN  Name of Charge Nurse/RN Notified Erin  Date Charge Nurse/RN Notified 06/03/21  Time Charge Nurse/RN Notified 1230  Notify: Provider  Provider Name/Title Dr. Randol Kern  Date Provider Notified 06/03/21  Time Provider Notified 1230  Notification Type Page  Notification Reason Other (Comment) (hypotensive)  Provider response See new orders  Date of Provider Response 06/03/21  Time of Provider Response 1300  Document  Progress note created (see row info) Yes

## 2021-06-04 LAB — CBC
HCT: 47 % (ref 39.0–52.0)
Hemoglobin: 16.5 g/dL (ref 13.0–17.0)
MCH: 32.2 pg (ref 26.0–34.0)
MCHC: 35.1 g/dL (ref 30.0–36.0)
MCV: 91.8 fL (ref 80.0–100.0)
Platelets: 273 10*3/uL (ref 150–400)
RBC: 5.12 MIL/uL (ref 4.22–5.81)
RDW: 13.2 % (ref 11.5–15.5)
WBC: 11 10*3/uL — ABNORMAL HIGH (ref 4.0–10.5)
nRBC: 0 % (ref 0.0–0.2)

## 2021-06-04 LAB — BASIC METABOLIC PANEL
Anion gap: 9 (ref 5–15)
BUN: 27 mg/dL — ABNORMAL HIGH (ref 8–23)
CO2: 26 mmol/L (ref 22–32)
Calcium: 8.5 mg/dL — ABNORMAL LOW (ref 8.9–10.3)
Chloride: 100 mmol/L (ref 98–111)
Creatinine, Ser: 0.73 mg/dL (ref 0.61–1.24)
GFR, Estimated: >60 mL/min (ref >60)
Glucose, Bld: 89 mg/dL (ref 70–99)
Potassium: 3.5 mmol/L (ref 3.5–5.1)
Sodium: 135 mmol/L (ref 135–145)

## 2021-06-04 LAB — CULTURE, BLOOD (ROUTINE X 2)
Culture: NO GROWTH
Culture: NO GROWTH
Special Requests: ADEQUATE
Special Requests: ADEQUATE

## 2021-06-04 LAB — PHOSPHORUS: Phosphorus: 3.4 mg/dL (ref 2.5–4.6)

## 2021-06-04 NOTE — Plan of Care (Signed)
°  Problem: Education: Goal: Knowledge of risk factors and measures for prevention of condition will improve Outcome: Not Progressing   Problem: Coping: Goal: Psychosocial and spiritual needs will be supported Outcome: Not Progressing   Problem: Respiratory: Goal: Will maintain a patent airway Outcome: Progressing Goal: Complications related to the disease process, condition or treatment will be avoided or minimized Outcome: Progressing   Problem: Education: Goal: Knowledge of risk factors and measures for prevention of condition will improve Outcome: Not Progressing   Problem: Coping: Goal: Psychosocial and spiritual needs will be supported Outcome: Progressing   Problem: Respiratory: Goal: Will maintain a patent airway Outcome: Progressing Goal: Complications related to the disease process, condition or treatment will be avoided or minimized Outcome: Progressing

## 2021-06-04 NOTE — Progress Notes (Signed)
Speech Language Pathology Treatment: Dysphagia  Patient Details Name: Fred Graham MRN: [id number] DOB: 1951-02-09 Today's Date: 06/04/2021 Time: 9323-5573 SLP Time Calculation (min) (ACUTE ONLY): 15 min  Assessment / Plan / Recommendation Clinical Impression  Pt was seen for dysphagia treatment. He was alert and cooperative during the session. Pt stated that he has not been hungry this morning. He tolerated bacon, peaches, and nectar thick liquids via straw without overt s/sx of aspiration and oral phase appeared [facility] for these consistencies. Pt continues to demonstrate throat clearing and or coughing with thin liquids which was lessened, but not eliminated, with reduced bolus sizes via cup. A modified barium swallow study is recommended to further assess physiology and it will be planned for 12/18 pending radiology's schedule.   HPI HPI: 70yo male admitted 05/27/21 after being found down at home. Per EMS, family had not heard from the patient in several days and called for welfare check. PMH: HTN, alcohol abuse, depression  Admitted with aspiration pna and COVID 19. CXR 12/13 showed not new findings.  Intake listed as 60 mL only - no meal intake documented.  Pt WBC is 10.8 today but trending down.  Follow up to assure po tolerance indicated and for family education.      SLP Plan  MBS      Recommendations for follow up therapy are one component of a multi-disciplinary discharge planning process, led by the attending physician.  Recommendations may be updated based on patient status, additional functional criteria and insurance authorization.    Recommendations  Diet recommendations: Dysphagia 3 (mechanical soft);Nectar-thick liquid Liquids provided via: Cup;Straw Medication Administration: Crushed with puree Supervision: Patient able to self feed Compensations: Slow rate;Small sips/bites;Minimize environmental distractions Postural Changes and/or Swallow Maneuvers: Seated upright 90  degrees                Oral Care Recommendations: Oral care BID Follow Up Recommendations:  (TBD) Assistance recommended at discharge: Frequent or constant Supervision/Assistance SLP Visit Diagnosis: Dysphagia, unspecified (R13.10) Plan: MBS         [place] Delio I. Vear Clock, MS, CCC-SLP Acute Rehabilitation Services Office number [phone] Pager [phone]   [name]  06/04/2021, 9:44 AM

## 2021-06-04 NOTE — Progress Notes (Signed)
PROGRESS NOTE                                                                                                                                                                                                             Patient Demographics:    Fred Graham, is a 70 y.o. male, DOB - Feb 26, 1951, CXK:[id number]  Outpatient Primary MD for the patient is [name], NP    LOS - 7  Admit date - 05/27/2021    Chief Complaint  Patient presents with   Altered Mental Status       Brief Narrative (HPI from H&P)    Fred Graham is a 70 y.o. male with medical history significant of hypertension, alcohol abuse, depression presented to the ED via EMS after being found down at home at the time of evaluation by law enforcement.  Per EMS, family had not heard from the patient in several days and called for welfare check.  Police found the patient down on the floor in his underwear, altered, very cold to touch, and did not have heating on at home, was brought to the hospital with confusion, hypothermia, sepsis due to pneumonia and COVID infection.  He was admitted for further care.   Subjective:   No significant events overnight as discussed with staff, patient this morning is more appropriate, he denies any complaints, he had a good breakfast today with staff assistance.       Assessment  & Plan :    Severe sepsis due to possible aspiration pneumonia in the setting of COVID-19 infection in a patient who is vaccinated with 2 shots of COVID-vaccine - treated with  empiric antibiotics which have been titrated down  He was treated with vancomycin, cefepime and Flagyl, transitioned to Levaquin and Flagyl,  inflammatory markers are borderline.   - responding well, also on trial of steroids and remdesivir. -  sepsis pathophysiology has improved.  leukocytosis has improved and procalcitonin has trended to normal .   3/4 blood culture growing  Epidermidis -Procalcitonin is reassuring, will follow on repeat blood cultures.  2D echo with no evidence of vegetation. -Surveillance blood cultures obtained on 12/11, so far with no growth to date -This is most likely contamination, so far no indication to treat  Acute on chronic hyponatremia.   - Worse after hydration electrolytes suggest SIADH,  Samsca on 05/29/2021 with improvement repeated dose on 05/30/2021.  Rhabdomyolysis.   - Hydrated.  Metobolic encephalopathy. -   Improving with supportive care, no focal deficits head CT stable. -Patient significantly improved, initially encephalopathic secondary to alcohol withdrawals, has been sleepy over last couple days most likely due to liver protocol and Librium, he is more awake today after they have been discontinued. -B12 and TSH within normal limit, continue with folic acid supplements -Mentation continues to improve, he is more awake and alert and appropriate today.  Alcohol abuse with DTs .   -Please see above discussion under metabolic encephalopathy  Hypothermia.  -  Due to combination of sepsis and exposure to elements.  Resolved after supportive care for aspiration pneumonia.  Deconditioning and fall.  PT OT.  Hydrated.  Chronic systolic heart failure with EF 45 to 50% -No evidence of volume overload during this hospital stay, EF 45 to 50%, neurology input greatly appreciated -Given his soft blood pressure we have to hold his lisinopril and Coreg.  Hypertension/hypotension -Blood pressure has been soft, all of the hypertensive medication has been discontinued including Norvasc, lisinopril, and low-dose Coreg has been discontinued as well . -Blood pressure is acceptable today on IV fluids   Mildly elevated transaminases.   -Likely due to alcohol abuse, trending down  Mildly elevated troponin.  -Cardiology input greatly appreciated, this is secondary to demand ischemia in the setting of critical illness.  Hypokalemia -  replaced.        Condition - Extremely Guarded  Family Communication  : D/W daughter by phone 12/17  Code Status :  Full  Consults  :  None  PUD Prophylaxis : Pepcid   Procedures  :     TTE -   EEG - non acute  CT head and C-spine.  Nonacute.  CTA - Patent three-vessel runoff bilaterally. Additional ancillary findings as above  CT chest -  Nondisplaced right anterolateral 5th and 6th rib fractures. No pneumothorax is seen. Suspected mild aspiration in the right middle lobe and bilateral lung bases. Aortic Atherosclerosis (ICD10-I70.0).       Disposition Plan  :    Status is: Inpatient  Remains inpatient appropriate because: Sepsis due to pneumonia.  DVT Prophylaxis  :    enoxaparin (LOVENOX) injection 40 mg Start: 05/28/21 1600     Lab Results  Component Value Date   PLT 273 06/04/2021    Diet :  Diet Order             DIET SOFT Room service appropriate? Yes; Fluid consistency: Nectar Thick  Diet effective now                    Inpatient Medications  Scheduled Meds:  aspirin  81 mg Oral Daily   carvedilol  3.125 mg Oral BID WC   chlorhexidine  15 mL Mouth Rinse BID   enoxaparin (LOVENOX) injection  40 mg Subcutaneous Q24H   feeding supplement  237 mL Oral TID BM   folic acid  1 mg Oral Daily   mouth rinse  15 mL Mouth Rinse q12n4p   multivitamin with minerals  1 tablet Oral Daily   phosphorus  250 mg Oral BID   rosuvastatin  10 mg Oral Daily   thiamine  100 mg Oral Daily   Or   thiamine  100 mg Intravenous Daily   Continuous Infusions:  sodium chloride     sodium chloride 75 mL/hr at 06/04/21 0951   PRN  Meds:.sodium chloride, acetaminophen, lip balm, loperamide  Antibiotics  :    Anti-infectives (From admission, onward)    Start     Dose/Rate Route Frequency Ordered Stop   05/29/21 1000  remdesivir 100 mg in sodium chloride 0.9 % 100 mL IVPB       See Hyperspace for full Linked Orders Report.   100 mg 200 mL/hr over 30 Minutes  Intravenous Daily 05/28/21 0530 06/01/21 0944   05/29/21 0200  vancomycin (VANCOREADY) IVPB 2000 mg/400 mL  Status:  Discontinued        2,000 mg 200 mL/hr over 120 Minutes Intravenous Every 24 hours 05/28/21 0538 05/28/21 0942   05/28/21 1100  levofloxacin (LEVAQUIN) tablet 750 mg        750 mg Oral Daily 05/28/21 0943 05/31/21 1325   05/28/21 1100  metroNIDAZOLE (FLAGYL) tablet 500 mg        500 mg Oral Every 8 hours 05/28/21 0943 06/01/21 2359   05/28/21 0800  metroNIDAZOLE (FLAGYL) IVPB 500 mg  Status:  Discontinued        500 mg 100 mL/hr over 60 Minutes Intravenous Every 8 hours 05/28/21 0523 05/28/21 0942   05/28/21 0800  ceFEPIme (MAXIPIME) 2 g in sodium chloride 0.9 % 100 mL IVPB  Status:  Discontinued        2 g 200 mL/hr over 30 Minutes Intravenous Every 8 hours 05/28/21 0538 05/28/21 0943   05/28/21 0530  remdesivir 200 mg in sodium chloride 0.9% 250 mL IVPB       See Hyperspace for full Linked Orders Report.   200 mg 580 mL/hr over 30 Minutes Intravenous Once 05/28/21 0530 05/28/21 0754   05/27/21 2330  ceFEPIme (MAXIPIME) 2 g in sodium chloride 0.9 % 100 mL IVPB        2 g 200 mL/hr over 30 Minutes Intravenous  Once 05/27/21 2315 05/28/21 0205   05/27/21 2330  metroNIDAZOLE (FLAGYL) IVPB 500 mg        500 mg 100 mL/hr over 60 Minutes Intravenous  Once 05/27/21 2315 05/28/21 0205   05/27/21 2330  vancomycin (VANCOREADY) IVPB 2000 mg/400 mL        2,000 mg 200 mL/hr over 120 Minutes Intravenous  Once 05/27/21 2315 05/28/21 0422          Temari Schooler M.D on 06/04/2021 at 1:27 PM  To page go to [email or web address]   [facility] -  Office  [phone]  See all Orders from today for further details    Objective:   Vitals:   06/04/21 0500 06/04/21 0600 06/04/21 0840 06/04/21 1240  BP:   135/76 124/72  Pulse: 85 84 89 89  Resp: Temp:   98.5 F (36.9 C) 98.5 F (36.9 C)  TempSrc:   Axillary Axillary  SpO2: 91% 95% 92% 92%  Weight:       Height:        Wt Readings from Last 3 Encounters:  05/28/21 103.6 kg  05/05/19 93 kg  10/20/18 95.3 kg     Intake/Output Summary (Last 24 hours) at 06/04/2021 1327 Last data filed at 06/04/2021 0600 Gross per 24 hour  Intake 2048.81 ml  Output 500 ml  Net 1548.81 ml     Physical Exam  Awake Alert, more appropriate and coherent today, he is oriented x2, and oriented to year but not month, he is frail and deconditioned.   Symmetrical Chest wall movement, Good air movement bilaterally, CTAB RRR,No Gallops,Rubs or new  Murmurs, No Parasternal Heave +ve B.Sounds, Abd Soft, No tenderness, No rebound - guarding or rigidity. No Cyanosis, Clubbing or edema, No new Rash or bruise         Data Review:    CBC Recent Labs  Lab 05/29/21 0628 05/30/21 0242 05/31/21 0225 06/01/21 0040 06/02/21 0101 06/04/21 0604  WBC 17.1* 15.6* 17.5* 10.8* 11.1* 11.0*  HGB 14.8 15.7 15.0 15.1 14.6 16.5  HCT 41.0 42.2 41.8 43.0 42.0 47.0  PLT 382 304 400 349 334 273  MCV 88.0 87.6 88.9 90.1 90.5 91.8  MCH 31.8 32.6 31.9 31.7 31.5 32.2  MCHC 36.1* 37.2* 35.9 35.1 34.8 35.1  RDW 12.4 12.4 12.5 12.8 13.1 13.2  LYMPHSABS 1.3 1.3 1.5 1.4  --   --   MONOABS 0.8 0.5 0.9 0.6  --   --   EOSABS 0.0 0.0 0.0 0.0  --   --   BASOSABS 0.0 0.0 0.0 0.0  --   --     Electrolytes Recent Labs  Lab 05/29/21 0628 05/29/21 1736 05/30/21 0242 05/31/21 0225 06/01/21 0040 06/02/21 0101 06/03/21 0143 06/04/21 0604  NA 125*   < > 130* 130* 134* 134* 132* 135  K 3.2*  --  3.7 3.5 3.6 3.1* 3.7 3.5  CL 85*  --  94* 94* 98 99 99 100  CO2 29  --  25 27 26 24 25 26   GLUCOSE 91  --  125* 121* 117* 119* 116* 89  BUN 17  --  14 17 22  31* 28* 27*  CREATININE 0.76  --  0.84 0.73 0.79 0.82 0.73 0.73  CALCIUM 8.2*  --  8.6* 8.7* 8.7* 8.5* 8.3* 8.5*  AST 142*   145*  --  110* 74* 60* 46*  --   --   ALT 87*   89*  --  90* 81* 75* 68*  --   --   ALKPHOS 64   52  --  48 53 53 41  --   --   BILITOT 1.0   1.1  --   0.8 0.9 0.9 0.6  --   --   ALBUMIN 2.9*   2.9*  --  2.8* 2.9* 3.0* 2.9*  --   --   MG 2.0  --  2.2 2.1 2.1 2.1  --   --   CRP 6.2*  --  3.8* 1.9* 1.2*  --   --   --   DDIMER 1.51*  --  1.11* 1.16* 0.97*  --   --   --   PROCALCITON 0.11  --  <0.10 <0.10 0.15  --   --   --   BNP 249.7*  --  352.7* 177.9* 155.0*  --   --   --    < > = values in this interval not displayed.    ------------------------------------------------------------------------------------------------------------------ No results for input(s): CHOL, HDL, LDLCALC, TRIG, CHOLHDL, LDLDIRECT in the last 72 hours.  No results found for: HGBA1C  No results for input(s): TSH, T4TOTAL, T3FREE, THYROIDAB in the last 72 hours.  Invalid input(s): FREET3  ------------------------------------------------------------------------------------------------------------------ ID Labs Recent Labs  Lab 05/29/21 0628 05/30/21 0242 05/31/21 0225 06/01/21 0040 06/02/21 0101 06/03/21 0143 06/04/21 0604  WBC 17.1* 15.6* 17.5* 10.8* 11.1*  --  11.0*  PLT 382 304 400 349 334  --  273  CRP 6.2* 3.8* 1.9* 1.2*  --   --   --   DDIMER 1.51* 1.11* 1.16* 0.97*  --   --   --  PROCALCITON 0.11 <0.10 <0.10 0.15  --   --   --   CREATININE 0.76 0.84 0.73 0.79 0.82 0.73 0.73   Cardiac Enzymes No results for input(s): CKMB, TROPONINI, MYOGLOBIN in the last 168 hours.  Invalid input(s): CK      Radiology Reports No results found.

## 2021-06-04 NOTE — TOC Progression Note (Signed)
Transition of Care [facility]) - Progression Note    Patient Details  Name: Fred Graham MRN: [id number] Date of Birth: [birth year]  Transition of [facility]) CM/SW Contact  Jimmy Picket, [place] Phone Number: 06/04/2021, 3:11 PM  Clinical Narrative:     CSW faxed pt out to facilities in the area.   Expected Discharge Plan: Skilled Nursing Facility Barriers to Discharge: SNF Covid, Continued Medical Work up  Expected Discharge Plan and Services Expected Discharge Plan: Skilled Nursing Facility In-house Referral: Clinical Social Work   Post Acute Care Choice: Skilled Nursing Facility Living arrangements for the past 2 months: Single Family Home                                       Social Determinants of Health (SDOH) Interventions    Readmission Risk Interventions No flowsheet data found.

## 2021-06-05 ENCOUNTER — Inpatient Hospital Stay (HOSPITAL_COMMUNITY): Payer: Medicare Other

## 2021-06-05 LAB — PHOSPHORUS: Phosphorus: 3.6 mg/dL (ref 2.5–4.6)

## 2021-06-05 NOTE — Progress Notes (Addendum)
PROGRESS NOTE                                                                                                                                                                                                             Patient Demographics:    Fred Graham, is a 70 y.o. male, DOB - [birth year], SEG:[id number]  Outpatient Primary MD for the patient is [name], NP    LOS - 8  Admit date - 05/27/2021    Chief Complaint  Patient presents with   Altered Mental Status       Brief Narrative (HPI from H&P)    Fred Graham is a 70 y.o. male with medical history significant of hypertension, alcohol abuse, depression presented to the ED via EMS after being found down at home at the time of evaluation by law enforcement.  Per EMS, family had not heard from the patient in several days and called for welfare check.  Police found the patient down on the floor in his underwear, altered, very cold to touch, and did not have heating on at home, was brought to the hospital with confusion, hypothermia, sepsis due to pneumonia and COVID infection.  He was admitted for further care.   Subjective:   No significant events overnight as discussed with staff, patient remains with intermittent confusion  He denies any complaintsthis morning he denies any complaints This morning.     Assessment  & Plan :    SevereHe denies any complaints this morning. sepsis due to possible aspiration pneumonia in the setting of COVID-19 infection in a patient who is vaccinated with 2 shots of COVID-vaccine - treated with  empiric antibiotics which have been titrated down  He was treated with vancomycin, cefepime and Flagyl, transitioned to Levaquin and Flagyl,  inflammatory markers are borderline.   - responding well, also on trial of steroids and remdesivir. -  sepsis pathophysiology has improved.  leukocytosis has improved and procalcitonin has trended to  normal .   3/4 blood culture growing Epidermidis -Procalcitonin is reassuring, will follow on repeat blood cultures.  2D echo with no evidence of vegetation. -Surveillance blood cultures obtained on 12/11, so far with no growth to date -This is most likely contamination, so far no indication to treat  Acute on chronic hyponatremia.   - Worse after hydration electrolytes suggest  SIADH, Samsca on 05/29/2021 with improvement repeated dose on 05/30/2021.  Rhabdomyolysis.   - Hydrated.  Metobolic encephalopathy. -   Improving with supportive care, no focal deficits head CT stable. -Patient significantly improved, initially encephalopathic secondary to alcohol withdrawals, has been sleepy over last couple days most likely due to liver protocol and Librium, he is more awake today after they have been discontinued. -B12 and TSH within normal limit, continue with folic acid supplements -Overall mentation is improving, but remains to fluctuate a lot, so MRI brain was obtained today with no acute findings. -SLP done today, he is advanced to dysphagia 3 with thick liquid  Alcohol abuse with DTs .   -Please see above discussion under metabolic encephalopathy  Hypothermia.  -  Due to combination of sepsis and exposure to elements.  Resolved after supportive care for aspiration pneumonia.  Deconditioning and fall.  PT OT.  Hydrated.  Chronic systolic heart failure with EF 45 to 50% -No evidence of volume overload during this hospital stay, EF 45 to 50%, neurology input greatly appreciated -Given his soft blood pressure we have to hold his lisinopril and Coreg.  Hypertension/hypotension -Blood pressure has been soft, all of the hypertensive medication has been discontinued including Norvasc, lisinopril, and low-dose Coreg has been discontinued as well . -Pressure acceptable, actually started to increase, so will DC IV fluids if his oral intake has improved.  Mildly elevated transaminases.    -Likely due to alcohol abuse, trending down  Mildly elevated troponin.  -Cardiology input greatly appreciated, this is secondary to demand ischemia in the setting of critical illness.  Hypokalemia - replaced.        Condition - Extremely Guarded  Family Communication  : D/W daughter by phone 12/17  Code Status :  Full  Consults  :  None  PUD Prophylaxis : Pepcid   Procedures  :     TTE -   EEG - non acute  CT head and C-spine.  Nonacute.  CTA - Patent three-vessel runoff bilaterally. Additional ancillary findings as above  CT chest -  Nondisplaced right anterolateral 5th and 6th rib fractures. No pneumothorax is seen. Suspected mild aspiration in the right middle lobe and bilateral lung bases. Aortic Atherosclerosis (ICD10-I70.0).       Disposition Plan  :    Status is: Inpatient  Remains inpatient appropriate because: Sepsis due to pneumonia.  DVT Prophylaxis  :    enoxaparin (LOVENOX) injection 40 mg Start: 05/28/21 1600     Lab Results  Component Value Date   PLT 273 06/04/2021    Diet :  Diet Order             DIET SOFT Room service appropriate? Yes; Fluid consistency: Nectar Thick  Diet effective now                    Inpatient Medications  Scheduled Meds:  aspirin  81 mg Oral Daily   carvedilol  3.125 mg Oral BID WC   chlorhexidine  15 mL Mouth Rinse BID   enoxaparin (LOVENOX) injection  40 mg Subcutaneous Q24H   feeding supplement  237 mL Oral TID BM   folic acid  1 mg Oral Daily   mouth rinse  15 mL Mouth Rinse q12n4p   multivitamin with minerals  1 tablet Oral Daily   rosuvastatin  10 mg Oral Daily   thiamine  100 mg Oral Daily   Or   thiamine  100 mg Intravenous Daily  Continuous Infusions:  sodium chloride     sodium chloride 75 mL/hr at 06/05/21 1023   PRN Meds:.sodium chloride, acetaminophen, lip balm, loperamide  Antibiotics  :    Anti-infectives (From admission, onward)    Start     Dose/Rate Route Frequency  Ordered Stop   05/29/21 1000  remdesivir 100 mg in sodium chloride 0.9 % 100 mL IVPB       See Hyperspace for full Linked Orders Report.   100 mg 200 mL/hr over 30 Minutes Intravenous Daily 05/28/21 0530 06/01/21 0944   05/29/21 0200  vancomycin (VANCOREADY) IVPB 2000 mg/400 mL  Status:  Discontinued        2,000 mg 200 mL/hr over 120 Minutes Intravenous Every 24 hours 05/28/21 0538 05/28/21 0942   05/28/21 1100  levofloxacin (LEVAQUIN) tablet 750 mg        750 mg Oral Daily 05/28/21 0943 05/31/21 1325   05/28/21 1100  metroNIDAZOLE (FLAGYL) tablet 500 mg        500 mg Oral Every 8 hours 05/28/21 0943 06/01/21 2359   05/28/21 0800  metroNIDAZOLE (FLAGYL) IVPB 500 mg  Status:  Discontinued        500 mg 100 mL/hr over 60 Minutes Intravenous Every 8 hours 05/28/21 0523 05/28/21 0942   05/28/21 0800  ceFEPIme (MAXIPIME) 2 g in sodium chloride 0.9 % 100 mL IVPB  Status:  Discontinued        2 g 200 mL/hr over 30 Minutes Intravenous Every 8 hours 05/28/21 0538 05/28/21 0943   05/28/21 0530  remdesivir 200 mg in sodium chloride 0.9% 250 mL IVPB       See Hyperspace for full Linked Orders Report.   200 mg 580 mL/hr over 30 Minutes Intravenous Once 05/28/21 0530 05/28/21 0754   05/27/21 2330  ceFEPIme (MAXIPIME) 2 g in sodium chloride 0.9 % 100 mL IVPB        2 g 200 mL/hr over 30 Minutes Intravenous  Once 05/27/21 2315 05/28/21 0205   05/27/21 2330  metroNIDAZOLE (FLAGYL) IVPB 500 mg        500 mg 100 mL/hr over 60 Minutes Intravenous  Once 05/27/21 2315 05/28/21 0205   05/27/21 2330  vancomycin (VANCOREADY) IVPB 2000 mg/400 mL        2,000 mg 200 mL/hr over 120 Minutes Intravenous  Once 05/27/21 2315 05/28/21 0422          Anamarie Hunn M.D on 06/05/2021 at 3:24 PM  To page go to [email or web address]   [facility] -  Office  [phone]  See all Orders from today for further details    Objective:   Vitals:   06/04/21 2056 06/05/21 0019 06/05/21 0450 06/05/21 0757  BP:   130/78 (!) 169/90 (!) 159/101  Pulse:  84 80 90  Resp:  20 20   Temp: (!) 97.5 F (36.4 C) (!) 97.5 F (36.4 C) 98.8 F (37.1 C) 98.7 F (37.1 C)  TempSrc: Oral Oral Oral Oral  SpO2:      Weight:      Height:        Wt Readings from Last 3 Encounters:  05/28/21 103.6 kg  05/05/19 93 kg  10/20/18 95.3 kg     Intake/Output Summary (Last 24 hours) at 06/05/2021 1524 Last data filed at 06/05/2021 0915 Gross per 24 hour  Intake 120 ml  Output 1225 ml  Net -1105 ml     Physical Exam   Awake, Frail and deconditioned, pleasantly confused  Symmetrical  Chest wall movement, Good air movement bilaterally, CTAB RRR,No Gallops,Rubs or new Murmurs, No Parasternal Heave +ve B.Sounds, Abd Soft, No tenderness, No rebound - guarding or rigidity. No Cyanosis, Clubbing or edema, No new Rash or bruise          Data Review:    CBC Recent Labs  Lab 05/30/21 0242 05/31/21 0225 06/01/21 0040 06/02/21 0101 06/04/21 0604  WBC 15.6* 17.5* 10.8* 11.1* 11.0*  HGB 15.7 15.0 15.1 14.6 16.5  HCT 42.2 41.8 43.0 42.0 47.0  PLT 304 400 349 334 273  MCV 87.6 88.9 90.1 90.5 91.8  MCH 32.6 31.9 31.7 31.5 32.2  MCHC 37.2* 35.9 35.1 34.8 35.1  RDW 12.4 12.5 12.8 13.1 13.2  LYMPHSABS 1.3 1.5 1.4  --   --   MONOABS 0.5 0.9 0.6  --   --   EOSABS 0.0 0.0 0.0  --   --   BASOSABS 0.0 0.0 0.0  --   --     Electrolytes Recent Labs  Lab 05/30/21 0242 05/31/21 0225 06/01/21 0040 06/02/21 0101 06/03/21 0143 06/04/21 0604  NA 130* 130* 134* 134* 132* 135  K 3.7 3.5 3.6 3.1* 3.7 3.5  CL 94* 94* 98 99 99 100  CO2 GLUCOSE 125* 121* 117* 119* 116* 89  BUN 31* 28* 27*  CREATININE 0.84 0.73 0.79 0.82 0.73 0.73  CALCIUM 8.6* 8.7* 8.7* 8.5* 8.3* 8.5*  AST 110* 74* 60* 46*  --   --   ALT 90* 81* 75* 68*  --   --   ALKPHOS 48 53 53 41  --   --   BILITOT 0.8 0.9 0.9 0.6  --   --   ALBUMIN 2.8* 2.9* 3.0* 2.9*  --   --   MG 2.2 2.1 2.1 2.1  --   --   CRP 3.8* 1.9*  1.2*  --   --   --   DDIMER 1.11* 1.16* 0.97*  --   --   --   PROCALCITON <0.10 <0.10 0.15  --   --   --   BNP 352.7* 177.9* 155.0*  --   --   --     ------------------------------------------------------------------------------------------------------------------ No results for input(s): CHOL, HDL, LDLCALC, TRIG, CHOLHDL, LDLDIRECT in the last 72 hours.  No results found for: HGBA1C  No results for input(s): TSH, T4TOTAL, T3FREE, THYROIDAB in the last 72 hours.  Invalid input(s): FREET3  ------------------------------------------------------------------------------------------------------------------ ID Labs Recent Labs  Lab 05/30/21 0242 05/31/21 0225 06/01/21 0040 06/02/21 0101 06/03/21 0143 06/04/21 0604  WBC 15.6* 17.5* 10.8* 11.1*  --  11.0*  PLT 304 400 349 334  --  273  CRP 3.8* 1.9* 1.2*  --   --   --   DDIMER 1.11* 1.16* 0.97*  --   --   --   PROCALCITON <0.10 <0.10 0.15  --   --   --   CREATININE 0.84 0.73 0.79 0.82 0.73 0.73   Cardiac Enzymes No results for input(s): CKMB, TROPONINI, MYOGLOBIN in the last 168 hours.  Invalid input(s): CK      Radiology Reports MR BRAIN WO CONTRAST  Result Date: 06/05/2021 CLINICAL DATA:  Delirium; confusion. EXAM: MRI HEAD WITHOUT CONTRAST TECHNIQUE: Multiplanar, multiecho pulse sequences of the brain and surrounding structures were obtained without intravenous contrast. COMPARISON:  Head CT 05/28/2021. FINDINGS: Brain: Intermittently motion degraded examination, limiting evaluation. Most notably, there is mild-to-moderate motion degradation of the axial T2 FLAIR sequence.  Mild generalized cerebral atrophy. Moderate multifocal T2 FLAIR hyperintense signal abnormality within the cerebral white matter, nonspecific but compatible with chronic small vessel ischemic disease. Subcentimeter focus of SWI signal loss within the posterior left frontal lobe, which may reflect a chronic parenchymal microhemorrhage or cavernoma (series 12,  image 44). There is no acute infarct. No evidence of an intracranial mass. No extra-axial fluid collection. No midline shift. Vascular: Maintained flow voids within the proximal large arterial vessels. Skull and upper cervical spine: No focal suspicious marrow lesion. Sinuses/Orbits: Visualized orbits show no acute finding. Moderate fluid level and background mild mucosal thickening within the left maxillary sinus. Small fluid level and background trace mucosal thickening within the right sphenoid sinus. Mild mucosal thickening within the left sphenoid sinus. Mild mucosal thickening within the left greater than right ethmoid sinuses. Trace mucosal thickening within the left frontal sinus. IMPRESSION: Intermittently motion degraded examination, as described. No evidence of acute intracranial abnormality. Moderate chronic small vessel ischemic changes within the cerebral white matter. Subcentimeter focus of susceptibility-weighted signal loss within the posterior left frontal lobe, which may reflect a chronic parenchymal microhemorrhage or cavernoma. Mild generalized cerebral atrophy. Paranasal sinus disease, most notably left maxillary and right sphenoid. Correlate for acute sinusitis. Electronically Signed   By: Jackey Loge D.O.   On: 06/05/2021 12:21

## 2021-06-05 NOTE — Plan of Care (Signed)
°  Problem: Education: Goal: Knowledge of General Education information will improve Description: Including pain rating scale, medication(s)/side effects and non-pharmacologic comfort measures Outcome: Progressing   Problem: Health Behavior/Discharge Planning: Goal: Ability to manage health-related needs will improve Outcome: Progressing   Problem: Clinical Measurements: Goal: Ability to maintain clinical measurements within normal limits will improve Outcome: Progressing Goal: Will remain free from infection Outcome: Progressing Goal: Diagnostic test results will improve Outcome: Progressing Goal: Respiratory complications will improve Outcome: Progressing Goal: Cardiovascular complication will be avoided Outcome: Progressing   Problem: Activity: Goal: Risk for activity intolerance will decrease Outcome: Progressing   Problem: Nutrition: Goal: Adequate nutrition will be maintained Outcome: Progressing   Problem: Coping: Goal: Level of anxiety will decrease Outcome: Progressing   Problem: Elimination: Goal: Will not experience complications related to bowel motility Outcome: Progressing Goal: Will not experience complications related to urinary retention Outcome: Progressing   Problem: Pain Managment: Goal: General experience of comfort will improve Outcome: Progressing   Problem: Safety: Goal: Ability to remain free from injury will improve Outcome: Progressing   Problem: Skin Integrity: Goal: Risk for impaired skin integrity will decrease Outcome: Progressing   Problem: Education: Goal: Knowledge of risk factors and measures for prevention of condition will improve Outcome: Progressing   Problem: Coping: Goal: Psychosocial and spiritual needs will be supported Outcome: Progressing   Problem: Respiratory: Goal: Will maintain a patent airway Outcome: Progressing Goal: Complications related to the disease process, condition or treatment will be avoided or  minimized Outcome: Progressing   Problem: Fluid Volume: Goal: Hemodynamic stability will improve Outcome: Progressing   Problem: Clinical Measurements: Goal: Diagnostic test results will improve Outcome: Progressing Goal: Signs and symptoms of infection will decrease Outcome: Progressing   Problem: Respiratory: Goal: Ability to maintain adequate ventilation will improve Outcome: Progressing   Problem: Education: Goal: Knowledge of risk factors and measures for prevention of condition will improve Outcome: Progressing   Problem: Coping: Goal: Psychosocial and spiritual needs will be supported Outcome: Progressing   Problem: Respiratory: Goal: Will maintain a patent airway Outcome: Progressing Goal: Complications related to the disease process, condition or treatment will be avoided or minimized Outcome: Progressing

## 2021-06-05 NOTE — Progress Notes (Signed)
Modified Barium Swallow Progress Note  Patient Details  Name: Fred Graham MRN: [id number] Date of Birth: Sep 15, 1950  Today's Date: 06/05/2021  Modified Barium Swallow completed.  Full report located under Chart Review in the Imaging Section.  Brief recommendations include the following:  Clinical Impression  Pt demonstrates impaired cognition. He is alert, leaning right, unable to be repositioned upright. He was participatory, but not able to be cued and mildly hostile. Pts oral phase was poorly coordinated and there was lingual pumping and slow gradual spillage to the pharynx of all boluses. Pt also had oral residue at times that he did transit to pharynx but did not initiate swallow. Pt tolerated thin liquids pooled in pharynx without aspiration until a pill  with thin liquids were given. At that time pt had silent aspiration of thin. Pt very likely to ahve other episodes of silent aspiration given late swallow, pooled bolus in pharynx, pts poor positioning, impaired cognition and unwillingness to correct himself with assist. Recommend pt continue a mechanical soft diet and nectar thick liquids.   Swallow Evaluation Recommendations       SLP Diet Recommendations: Dysphagia 3 (Mech soft) solids;Nectar thick liquid   Liquid Administration via: Cup;Straw   Medication Administration: Whole meds with puree   Supervision: Full supervision/cueing for compensatory strategies   Compensations: Slow rate;Small sips/bites;Minimize environmental distractions   Postural Changes: Remain semi-upright after after feeds/meals (Comment);Seated upright at 90 degrees           Harlon Ditty, MA CCC-SLP  Acute [facility] Services Office [phone]  Claudine Mouton 06/05/2021,3:17 PM

## 2021-06-06 DIAGNOSIS — R45851 Suicidal ideations: Secondary | ICD-10-CM

## 2021-06-06 NOTE — Progress Notes (Signed)
Occupational Therapy Treatment Patient Details Name: Fred Graham MRN: [id number] DOB: 03/15/1951 Today's Date: 06/06/2021   History of present illness 70 y.o. male with medical history significant of hypertension, alcohol abuse, depression presented to the ED 05/27/21 via EMS after being found down at home at the time of evaluation by law enforcement.  Per EMS, family had not heard from the patient in several days and called for welfare check.Admitted with aspiration pna and COVID 19. CT chest Nondisplaced right anterolateral 5th and 6th rib fractures ETOH. PMH: hypertension, alcohol abuse, depression   OT comments  Pt progressing towards acute OT goals. Trialed standing with Stedy and pt successfully cleared buttocks on second and third trials with max +2 physical assist and multimodal cues. Pt maintained trunk flexed position so unable to utilize [place] for OOB transfers yet. Recommend nursing staff utilize [employer] for OOB to [employer]. Pt with very flat affect, easily irritated, and tearful at times. Acute cognitive deficits remain. With encouragement, agreeable to all tasks presented. Of note, pt making comments such as "just throw me out that window" and "Just let me get my .38 (gun) out of my car." Pt expressing feelings of hopelessness. Nursing notified at end of session. Full session details below.    Recommendations for follow up therapy are one component of a multi-disciplinary discharge planning process, led by the attending physician.  Recommendations may be updated based on patient status, additional functional criteria and insurance authorization.    Follow Up Recommendations  Skilled nursing-short term rehab (<3 hours/day)    Assistance Recommended at Discharge Frequent or constant Supervision/Assistance  Equipment Recommendations  Other (comment) (defer to next venue)    Recommendations for Other Services      Precautions / Restrictions Precautions Precautions:  Fall Precaution Comments: found down at home Restrictions Weight Bearing Restrictions: No       Mobility Bed Mobility Overal bed mobility: Needs Assistance Bed Mobility: Supine to Sit;Sit to Sidelying;Rolling     Supine to sit: Mod assist;+2 for physical assistance;HOB elevated   Sit to sidelying: Mod assist;+2 for physical assistance General bed mobility comments: Multimodal cueing for sequencing and initiation. assist to advance hips to full EOB position. physical assist for BLE and trunk advancement.    Transfers Overall transfer level: Needs assistance Equipment used: Ambulation equipment used Antony Salmon) Transfers: Sit to/from Stand Sit to Stand: Max assist;+2 physical assistance;From elevated surface           General transfer comment: Utilized Stedy, elevated seat height, bed pad, multimodal cues. Pt able to fully clear buttocks from bed on second and third trials. Improved buttock clearance with reps. Maintained trunk flexed position, unable to achieve upright position enough to utilize seat of Stedy this session.     Balance Overall balance assessment: Needs assistance;History of Falls Sitting-balance support: Bilateral upper extremity supported;Feet supported Sitting balance-Leahy Scale: Poor Sitting balance - Comments: requires bilateral UE support; tending to lean forward     Standing balance-Leahy Scale: Zero                             ADL either performed or assessed with clinical judgement   ADL Overall ADL's : Needs assistance/impaired                 Upper Body Dressing : Maximal assistance;Sitting Upper Body Dressing Details (indicate cue type and reason): poor sitting balance.  General ADL Comments: Pt completed bed mobility, sat EOB several minutes with support, partial stand 3x utilizing Stedy. Pt improved hip/buttock clearance with repetitions.    Extremity/Trunk Assessment Upper Extremity  Assessment Upper Extremity Assessment: Generalized weakness (significant UE weakness. AROM shoulder flexion to about 45*. Pt tends to rely on whole arm movements.)   Lower Extremity Assessment Lower Extremity Assessment: Defer to PT evaluation        Vision       Perception     Praxis      Cognition Arousal/Alertness: Awake/alert Behavior During Therapy: Flat affect;Anxious (tearful) Overall Cognitive Status: Impaired/Different from baseline Area of Impairment: Orientation;Attention;Memory;Following commands;Safety/judgement;Awareness;Problem solving                 Orientation Level: Place;Time;Situation Current Attention Level: Focused Memory: Decreased short-term memory;Decreased recall of precautions Following Commands: Follows one step commands inconsistently Safety/Judgement: Decreased awareness of safety;Decreased awareness of deficits Awareness: Intellectual Problem Solving: Slow processing;Decreased initiation;Difficulty sequencing;Requires verbal cues;Requires tactile cues General Comments: When asked where he is pt pointed to building facing his window and said "that's Fred Graham." Pt then recalling history of [place] in the area. When redirected and ask to specifiy his immediate location pt struggled with response even with 2 choices given. Pt making comments such as "just throw me out that window." "How am I (going to get out of the bed)? Just let me get my.38 out of my car and I'll show you." Pt tearful and expressing feelings of hopelessness. Provided listening space, validation of self-worth and encouragement. Notifed nurse at end of session. Pt thanking therapists.          Exercises     Shoulder Instructions       General Comments      Pertinent Vitals/ Pain       Pain Assessment: Faces Faces Pain Scale: Hurts little more Pain Location: lower back, right calf during standing "cramp" Pain Descriptors / Indicators: Cramping;Sore Pain Intervention(s):  Monitored during session;Repositioned;Limited activity within patient's tolerance  Home Living                                          Prior Functioning/Environment              Frequency  Min 2X/week        Progress Toward Goals  OT Goals(current goals can now be found in the care plan section)  Progress towards OT goals: Progressing toward goals  Acute Rehab OT Goals Patient Stated Goal: not stated OT Goal Formulation: With patient Time For Goal Achievement: 06/13/21 Potential to Achieve Goals: Fair ADL Goals Pt Will Perform Grooming: with modified independence;sitting Pt Will Perform Upper Body Dressing: with min assist;sitting Pt Will Perform Lower Body Dressing: with min assist;sit to/from stand Pt Will Transfer to Toilet: with mod assist;with +2 assist;bedside commode;stand pivot transfer Pt/caregiver will Perform Home Exercise Program: Increased strength;Both right and left upper extremity;With written HEP provided Additional ADL Goal #1: Pt will follow 1 step commands with 90% accuracy and demonstrate emergent awareness during ADL routine.  Plan Discharge plan remains appropriate    Co-evaluation    PT/OT/SLP Co-Evaluation/Treatment: Yes Reason for Co-Treatment: Necessary to address cognition/behavior during functional activity;Complexity of the patient's impairments (multi-system involvement);For patient/therapist safety;To address functional/ADL transfers   OT goals addressed during session: ADL's and self-care      AM-PAC OT "6 Clicks" Daily Activity  Outcome Measure   Help from another person eating meals?: A Lot Help from another person taking care of personal grooming?: A Lot Help from another person toileting, which includes using toliet, bedpan, or urinal?: Total Help from another person bathing (including washing, rinsing, drying)?: A Lot Help from another person to put on and taking off regular upper body clothing?: A  Lot Help from another person to put on and taking off regular lower body clothing?: Total 6 Click Score: 10    End of Session Equipment Utilized During Treatment: Other (comment) Antony Salmon)  OT Visit Diagnosis: Other abnormalities of gait and mobility (R26.89);Muscle weakness (generalized) (M62.81);Other symptoms and signs involving cognitive function   Activity Tolerance Patient limited by fatigue;Patient limited by pain;Patient tolerated treatment well   Patient Left in bed;with call bell/phone within reach;with bed alarm set;with nursing/sitter in room   Nurse Communication Other (comment);Mobility status (see cognition comments above)        Time: 8280-0349 OT Time Calculation (min): 29 min  Charges: OT General Charges $OT Visit: 1 Visit OT Treatments $Self Care/Home Management : 8-22 mins  Raynald Kemp, OT Acute Rehabilitation Services Office: [phone]   Pilar Grammes 06/06/2021, 11:37 AM

## 2021-06-06 NOTE — Progress Notes (Signed)
PROGRESS NOTE                                                                                                                                                                                                             Patient Demographics:    Fred Graham, is a 70 y.o. male, DOB - 01-Jun-1951, WUJ:[id number]  Outpatient Primary MD for the patient is [name], NP    LOS - 9  Admit date - 05/27/2021    Chief Complaint  Patient presents with   Altered Mental Status       Brief Narrative (HPI from H&P)    Fred Graham is a 70 y.o. male with medical history significant of hypertension, alcohol abuse, depression presented to the ED via EMS after being found down at home at the time of evaluation by law enforcement.  Per EMS, family had not heard from the patient in several days and called for welfare check.  Police found the patient down on the floor in his underwear, altered, very cold to touch, and did not have heating on at home, was brought to the hospital with confusion, hypothermia, sepsis due to pneumonia and COVID infection.  Sepsis has resolved with treatment of remdesivir and antibiotics, patient was in severe alcohol withdrawals which has significantly improved, he remains with fluctuation in mental status but overall continued to improved, MRI/CT head with no acute findings. -12/20 patient expressed suicidal ideations to OT therapist.   Subjective:   No significant events overnight as discussed with staff, patient himself denies any complaints this morning, but he did report suicidal ideations to OT therapy later in the morning. -He had a good breakfast this morning, as well he finished all of his Ensure this morning when I offered it to him.    Assessment  & Plan :    SevereHe denies any complaints this morning. sepsis due to possible aspiration pneumonia in the setting of COVID-19 infection in a patient who is  vaccinated with 2 shots of COVID-vaccine - treated with  empiric antibiotics which have been titrated down  He was treated with vancomycin, cefepime and Flagyl, transitioned to Levaquin and Flagyl,  inflammatory markers are borderline.   - responding well, also on trial of steroids and remdesivir. -  sepsis pathophysiology has improved.  leukocytosis has improved and procalcitonin has trended  to normal .   3/4 blood culture growing Epidermidis -Procalcitonin is reassuring, will follow on repeat blood cultures.  2D echo with no evidence of vegetation. -Surveillance blood cultures obtained on 12/11, so far with no growth to date -This is most likely contamination, so far no indication to treat  Acute on chronic hyponatremia.   - Worse after hydration electrolytes suggest SIADH, Samsca on 05/29/2021 with improvement repeated dose on 05/30/2021.  Rhabdomyolysis.   - Hydrated.  Metobolic encephalopathy. -   Improving with supportive care, no focal deficits head CT stable. -Patient significantly improved, initially encephalopathic secondary to alcohol withdrawals, has been sleepy over last couple days most likely due to liver protocol and Librium, he is more awake today after they have been discontinued. -B12 and TSH within normal limit, continue with folic acid supplements -Overall mentation is improving, but remains to fluctuate a lot, so MRI brain was obtained today with no acute findings. -SLP done today, he is advanced to dysphagia 3 with thick liquid -Overall he is more awake and alert and appropriate today, he had a good breakfast.  Alcohol abuse with DTs .   -Please see above discussion under metabolic encephalopathy  Hypothermia.  -  Due to combination of sepsis and exposure to elements.  Resolved after supportive care for aspiration pneumonia.  Deconditioning and fall.  PT OT.  Hydrated.  Chronic systolic heart failure with EF 45 to 50% -No evidence of volume overload during this  hospital stay, EF 45 to 50%, neurology input greatly appreciated -Given his soft blood pressure we have to hold his lisinopril and Coreg.  Hypertension/hypotension -Blood pressure has been soft, all of the hypertensive medication has been discontinued including Norvasc, lisinopril, and low-dose Coreg has been discontinued as well . -Blood pressure is labile, still have some low readings, oral intake appears to be improved, will check cosyntropin stim test  in a.m.  Mildly elevated transaminases.   -Likely due to alcohol abuse, trending down  Mildly elevated troponin.  -Cardiology input greatly appreciated, this is secondary to demand ischemia in the setting of critical illness.  Hypokalemia - replaced.  History  of depression/suicidal ideations -Patient on Lexapro at home, he did express suicidal ideation to OT therapist today, will keep on safety precaution and request psych consult.      Condition - Extremely Guarded  Family Communication  : D/W daughter by phone 12/20  Code Status :  Full  Consults  :  None  PUD Prophylaxis : Pepcid  Disposition: Will need SNF placement once he finishes COVID quarantine, remains inpatient given fluctuating blood pressure, and now with suicidal ideations.    Procedures  :     TTE -   EEG - non acute  CT head and C-spine.  Nonacute.  CTA - Patent three-vessel runoff bilaterally. Additional ancillary findings as above  CT chest -  Nondisplaced right anterolateral 5th and 6th rib fractures. No pneumothorax is seen. Suspected mild aspiration in the right middle lobe and bilateral lung bases. Aortic Atherosclerosis (ICD10-I70.0).       Disposition Plan  :    Status is: Inpatient  DVT Prophylaxis  :    enoxaparin (LOVENOX) injection 40 mg Start: 05/28/21 1600     Lab Results  Component Value Date   PLT 273 06/04/2021    Diet :  Diet Order             DIET SOFT Room service appropriate? Yes; Fluid consistency: Nectar Thick   Diet effective  now                    Inpatient Medications  Scheduled Meds:  aspirin  81 mg Oral Daily   chlorhexidine  15 mL Mouth Rinse BID   enoxaparin (LOVENOX) injection  40 mg Subcutaneous Q24H   feeding supplement  237 mL Oral TID BM   folic acid  1 mg Oral Daily   mouth rinse  15 mL Mouth Rinse q12n4p   multivitamin with minerals  1 tablet Oral Daily   rosuvastatin  10 mg Oral Daily   thiamine  100 mg Oral Daily   Or   thiamine  100 mg Intravenous Daily   Continuous Infusions:  sodium chloride     sodium chloride 75 mL/hr at 06/05/21 2058   PRN Meds:.sodium chloride, acetaminophen, lip balm, loperamide  Antibiotics  :    Anti-infectives (From admission, onward)    Start     Dose/Rate Route Frequency Ordered Stop   05/29/21 1000  remdesivir 100 mg in sodium chloride 0.9 % 100 mL IVPB       See Hyperspace for full Linked Orders Report.   100 mg 200 mL/hr over 30 Minutes Intravenous Daily 05/28/21 0530 06/01/21 0944   05/29/21 0200  vancomycin (VANCOREADY) IVPB 2000 mg/400 mL  Status:  Discontinued        2,000 mg 200 mL/hr over 120 Minutes Intravenous Every 24 hours 05/28/21 0538 05/28/21 0942   05/28/21 1100  levofloxacin (LEVAQUIN) tablet 750 mg        750 mg Oral Daily 05/28/21 0943 05/31/21 1325   05/28/21 1100  metroNIDAZOLE (FLAGYL) tablet 500 mg        500 mg Oral Every 8 hours 05/28/21 0943 06/01/21 2359   05/28/21 0800  metroNIDAZOLE (FLAGYL) IVPB 500 mg  Status:  Discontinued        500 mg 100 mL/hr over 60 Minutes Intravenous Every 8 hours 05/28/21 0523 05/28/21 0942   05/28/21 0800  ceFEPIme (MAXIPIME) 2 g in sodium chloride 0.9 % 100 mL IVPB  Status:  Discontinued        2 g 200 mL/hr over 30 Minutes Intravenous Every 8 hours 05/28/21 0538 05/28/21 0943   05/28/21 0530  remdesivir 200 mg in sodium chloride 0.9% 250 mL IVPB       See Hyperspace for full Linked Orders Report.   200 mg 580 mL/hr over 30 Minutes Intravenous Once 05/28/21 0530  05/28/21 0754   05/27/21 2330  ceFEPIme (MAXIPIME) 2 g in sodium chloride 0.9 % 100 mL IVPB        2 g 200 mL/hr over 30 Minutes Intravenous  Once 05/27/21 2315 05/28/21 0205   05/27/21 2330  metroNIDAZOLE (FLAGYL) IVPB 500 mg        500 mg 100 mL/hr over 60 Minutes Intravenous  Once 05/27/21 2315 05/28/21 0205   05/27/21 2330  vancomycin (VANCOREADY) IVPB 2000 mg/400 mL        2,000 mg 200 mL/hr over 120 Minutes Intravenous  Once 05/27/21 2315 05/28/21 0422          Rheda Kassab M.D on 06/06/2021 at 3:03 PM  To page go to [email or web address]   [facility] -  Office  [phone]  See all Orders from today for further details    Objective:   Vitals:   06/05/21 2059 06/06/21 0032 06/06/21 0812 06/06/21 1203  BP: 100/63 (!) 156/98 (!) 153/91 (!) 92/57  Pulse:  80  79  Resp:  18    Temp:  98.2 F (36.8 C) 98.7 F (37.1 C) (!) 97.5 F (36.4 C)  TempSrc:  Oral Oral Oral  SpO2:      Weight:      Height:        Wt Readings from Last 3 Encounters:  05/28/21 103.6 kg  05/05/19 93 kg  10/20/18 95.3 kg     Intake/Output Summary (Last 24 hours) at 06/06/2021 1503 Last data filed at 06/06/2021 0457 Gross per 24 hour  Intake --  Output 750 ml  Net -750 ml     Physical Exam   Awake, he is more alert and appropriate today, he is oriented x2, answering most questions appropriately but certainly impaired cognition and insight, he has flat affect. . Symmetrical Chest wall movement, Good air movement bilaterally, CTAB RRR,No Gallops,Rubs or new Murmurs, No Parasternal Heave +ve B.Sounds, Abd Soft, No tenderness, No rebound - guarding or rigidity. No Cyanosis, Clubbing or edema, No new Rash or bruise           Data Review:    CBC Recent Labs  Lab 05/31/21 0225 06/01/21 0040 06/02/21 0101 06/04/21 0604  WBC 17.5* 10.8* 11.1* 11.0*  HGB 15.0 15.1 14.6 16.5  HCT 41.8 43.0 42.0 47.0  PLT 400 349 334 273  MCV 88.9 90.1 90.5 91.8  MCH 31.9 31.7 31.5  32.2  MCHC 35.9 35.1 34.8 35.1  RDW 12.5 12.8 13.1 13.2  LYMPHSABS 1.5 1.4  --   --   MONOABS 0.9 0.6  --   --   EOSABS 0.0 0.0  --   --   BASOSABS 0.0 0.0  --   --     Electrolytes Recent Labs  Lab 05/31/21 0225 06/01/21 0040 06/02/21 0101 06/03/21 0143 06/04/21 0604  NA 130* 134* 134* 132* 135  K 3.5 3.6 3.1* 3.7 3.5  CL 94* 98 99 99 100  CO2 27 26 24 25 26   GLUCOSE 121* 117* 119* 116* 89  BUN 17 22 31* 28* 27*  CREATININE 0.73 0.79 0.82 0.73 0.73  CALCIUM 8.7* 8.7* 8.5* 8.3* 8.5*  AST 74* 60* 46*  --   --   ALT 81* 75* 68*  --   --   ALKPHOS 53 53 41  --   --   BILITOT 0.9 0.9 0.6  --   --   ALBUMIN 2.9* 3.0* 2.9*  --   --   MG 2.1 2.1 2.1  --   --   CRP 1.9* 1.2*  --   --   --   DDIMER 1.16* 0.97*  --   --   --   PROCALCITON <0.10 0.15  --   --   --   BNP 177.9* 155.0*  --   --   --     ------------------------------------------------------------------------------------------------------------------ No results for input(s): CHOL, HDL, LDLCALC, TRIG, CHOLHDL, LDLDIRECT in the last 72 hours.  No results found for: HGBA1C  No results for input(s): TSH, T4TOTAL, T3FREE, THYROIDAB in the last 72 hours.  Invalid input(s): FREET3  ------------------------------------------------------------------------------------------------------------------ ID Labs Recent Labs  Lab 05/31/21 0225 06/01/21 0040 06/02/21 0101 06/03/21 0143 06/04/21 0604  WBC 17.5* 10.8* 11.1*  --  11.0*  PLT 400 349 334  --  273  CRP 1.9* 1.2*  --   --   --   DDIMER 1.16* 0.97*  --   --   --   PROCALCITON <0.10 0.15  --   --   --   CREATININE  0.73 0.79 0.82 0.73 0.73   Cardiac Enzymes No results for input(s): CKMB, TROPONINI, MYOGLOBIN in the last 168 hours.  Invalid input(s): CK      Radiology Reports DG Knee 1-2 Views Right  Result Date: 06/05/2021 CLINICAL DATA:  Recent fall with right knee pain, initial EXAM: RIGHT KNEE - 2 VIEW COMPARISON:  None. FINDINGS: No acute fracture  or dislocation is noted. No joint effusion is seen. Mild degenerative changes are noted in the patellofemoral joint. IMPRESSION: Degenerative change without acute abnormality. Electronically Signed   By: Alcide Clever M.D.   On: 06/05/2021 15:37   MR BRAIN WO CONTRAST  Result Date: 06/05/2021 CLINICAL DATA:  Delirium; confusion. EXAM: MRI HEAD WITHOUT CONTRAST TECHNIQUE: Multiplanar, multiecho pulse sequences of the brain and surrounding structures were obtained without intravenous contrast. COMPARISON:  Head CT 05/28/2021. FINDINGS: Brain: Intermittently motion degraded examination, limiting evaluation. Most notably, there is mild-to-moderate motion degradation of the axial T2 FLAIR sequence. Mild generalized cerebral atrophy. Moderate multifocal T2 FLAIR hyperintense signal abnormality within the cerebral white matter, nonspecific but compatible with chronic small vessel ischemic disease. Subcentimeter focus of SWI signal loss within the posterior left frontal lobe, which may reflect a chronic parenchymal microhemorrhage or cavernoma (series 12, image 44). There is no acute infarct. No evidence of an intracranial mass. No extra-axial fluid collection. No midline shift. Vascular: Maintained flow voids within the proximal large arterial vessels. Skull and upper cervical spine: No focal suspicious marrow lesion. Sinuses/Orbits: Visualized orbits show no acute finding. Moderate fluid level and background mild mucosal thickening within the left maxillary sinus. Small fluid level and background trace mucosal thickening within the right sphenoid sinus. Mild mucosal thickening within the left sphenoid sinus. Mild mucosal thickening within the left greater than right ethmoid sinuses. Trace mucosal thickening within the left frontal sinus. IMPRESSION: Intermittently motion degraded examination, as described. No evidence of acute intracranial abnormality. Moderate chronic small vessel ischemic changes within the cerebral  white matter. Subcentimeter focus of susceptibility-weighted signal loss within the posterior left frontal lobe, which may reflect a chronic parenchymal microhemorrhage or cavernoma. Mild generalized cerebral atrophy. Paranasal sinus disease, most notably left maxillary and right sphenoid. Correlate for acute sinusitis. Electronically Signed   By: Jackey Loge D.O.   On: 06/05/2021 12:21   DG HIP UNILAT WITH PELVIS 1V RIGHT  Result Date: 06/05/2021 CLINICAL DATA:  Recent fall with hip pain, initial encounter EXAM: DG HIP (WITH OR WITHOUT PELVIS) 3V RIGHT COMPARISON:  12/06/2013 FINDINGS: Degenerative changes of the right hip joint are seen. Lucency is noted along the greater trochanter consistent with an undisplaced greater trochanter fracture. No other fractures are seen. IMPRESSION: Lucency along the right greater trochanter consistent with undisplaced fracture. CT of the pelvis may be helpful to assess for more occult injury. Degenerative changes of the right hip joint. Electronically Signed   By: Alcide Clever M.D.   On: 06/05/2021 15:37

## 2021-06-06 NOTE — Progress Notes (Signed)
Physical Therapy Treatment Patient Details Name: Fred Graham MRN: [id number] DOB: 1951-05-02 Today's Date: 06/06/2021   History of Present Illness 70 y.o. male with medical history significant of hypertension, alcohol abuse, depression presented to the ED 05/27/21 via EMS after being found down at home at the time of evaluation by law enforcement.  Per EMS, family had not heard from the patient in several days and called for welfare check.Admitted with aspiration pna and COVID 19. CT chest Nondisplaced right anterolateral 5th and 6th rib fractures ETOH. PMH: hypertension, alcohol abuse, depression    PT Comments    Patient cooperative, although internally distracted at times. He was able to use stedy from elevated bed height to lift his hips ~8" off mattress, but could not fully stand despite +2 max assist. Patient making slow progress with therapy as this is the first time he has been able to clear his buttocks off bed with standing attempts.     Recommendations for follow up therapy are one component of a multi-disciplinary discharge planning process, led by the attending physician.  Recommendations may be updated based on patient status, additional functional criteria and insurance authorization.  Follow Up Recommendations  Skilled nursing-short term rehab (<3 hours/day)     Assistance Recommended at Discharge Frequent or constant Supervision/Assistance  Equipment Recommendations  Other (comment) (TBD)    Recommendations for Other Services       Precautions / Restrictions Precautions Precautions: Fall Precaution Comments: found down at home Restrictions Weight Bearing Restrictions: No     Mobility  Bed Mobility Overal bed mobility: Needs Assistance Bed Mobility: Supine to Sit;Sit to Sidelying     Supine to sit: Mod assist;+2 for physical assistance;HOB elevated   Sit to sidelying: Mod assist;+2 for physical assistance General bed mobility comments: Multimodal cueing for  sequencing and initiation. assist to advance hips to full EOB position. physical assist for BLE and trunk advancement.    Transfers Overall transfer level: Needs assistance Equipment used: Ambulation equipment used Antony Salmon) Transfers: Sit to/from Stand Sit to Stand: Max assist;+2 physical assistance;From elevated surface           General transfer comment: Utilized Stedy, elevated seat height, bed pad, multimodal cues. Pt able to fully clear buttocks from bed on second and third trials. Improved buttock clearance with reps. Maintained trunk flexed position, unable to achieve upright position enough to utilize seat of Stedy this session.    Ambulation/Gait               General Gait Details: unable   Stairs             Wheelchair Mobility    Modified Rankin (Stroke Patients Only)       Balance Overall balance assessment: Needs assistance;History of Falls Sitting-balance support: Bilateral upper extremity supported;Feet supported Sitting balance-Leahy Scale: Poor Sitting balance - Comments: requires bilateral UE support; tending to lean forward or to right     Standing balance-Leahy Scale: Zero                              Cognition Arousal/Alertness: Awake/alert Behavior During Therapy: Flat affect;Anxious (tearful) Overall Cognitive Status: Impaired/Different from baseline Area of Impairment: Orientation;Attention;Memory;Following commands;Safety/judgement;Awareness;Problem solving                 Orientation Level: Place;Time;Situation Current Attention Level: Focused Memory: Decreased short-term memory;Decreased recall of precautions Following Commands: Follows one step commands inconsistently Safety/Judgement: Decreased awareness of safety;Decreased  awareness of deficits Awareness: Intellectual Problem Solving: Slow processing;Decreased initiation;Difficulty sequencing;Requires verbal cues;Requires tactile cues General Comments:  When asked where he is pt pointed to building facing his window and said "that's [facility]." Pt then recalling history of [facility] in the area. When redirected and ask to specifiy his immediate location pt struggled with response even with 2 choices given. Pt making comments such as "just throw me out that window." "How am I (going to get out of the bed)? Just let me get my.38 out of my car and I'll show you." Pt tearful and expressing feelings of hopelessness. Provided listening space, validation of self-worth and encouragement. Notifed nurse at end of session. Pt thanking therapists.        Exercises      General Comments        Pertinent Vitals/Pain Pain Assessment: Faces Faces Pain Scale: Hurts little more Pain Location: lower back, right calf during standing "cramp" Pain Descriptors / Indicators: Cramping;Sore Pain Intervention(s): Limited activity within patient's tolerance;Monitored during session    Home Living                          Prior Function            PT Goals (current goals can now be found in the care plan section) Acute Rehab PT Goals Patient Stated Goal: to go home PT Goal Formulation: With patient Time For Goal Achievement: 06/11/21 Potential to Achieve Goals: Fair Progress towards PT goals: Progressing toward goals    Frequency    Min 2X/week      PT Plan Current plan remains appropriate    Co-evaluation PT/OT/SLP Co-Evaluation/Treatment: Yes Reason for Co-Treatment: Complexity of the patient's impairments (multi-system involvement);Necessary to address cognition/behavior during functional activity;For patient/therapist safety PT goals addressed during session: Mobility/safety with mobility;Balance;Strengthening/ROM OT goals addressed during session: ADL's and self-care      AM-PAC PT "6 Clicks" Mobility   Outcome Measure  Help needed turning from your back to your side while in a flat bed without using bedrails?: Total Help needed  moving from lying on your back to sitting on the side of a flat bed without using bedrails?: Total Help needed moving to and from a bed to a chair (including a wheelchair)?: Total Help needed standing up from a chair using your arms (e.g., wheelchair or bedside chair)?: Total Help needed to walk in hospital room?: Total Help needed climbing 3-5 steps with a railing? : Total 6 Click Score: 6    End of Session   Activity Tolerance: Patient limited by fatigue Patient left: in bed;with call bell/phone within reach;with bed alarm set Nurse Communication: Mobility status PT Visit Diagnosis: Other abnormalities of gait and mobility (R26.89);Repeated falls (R29.6);Muscle weakness (generalized) (M62.81);History of falling (Z91.81);Difficulty in walking, not elsewhere classified (R26.2);Pain Pain - Right/Left:  (bilateral hips R>L) Pain - part of body:  (all over)     Time: 4403-4742 PT Time Calculation (min) (ACUTE ONLY): 29 min  Charges:  $Therapeutic Activity: 8-22 mins                      Jerolyn Center, PT Acute Rehabilitation Services  Pager [phone] Office [phone]    Zena Amos 06/06/2021, 12:03 PM

## 2021-06-07 ENCOUNTER — Inpatient Hospital Stay (HOSPITAL_COMMUNITY): Payer: Medicare Other

## 2021-06-07 DIAGNOSIS — G9341 Metabolic encephalopathy: Secondary | ICD-10-CM

## 2021-06-07 LAB — BASIC METABOLIC PANEL
Anion gap: 9 (ref 5–15)
BUN: 21 mg/dL (ref 8–23)
CO2: 23 mmol/L (ref 22–32)
Calcium: 8.3 mg/dL — ABNORMAL LOW (ref 8.9–10.3)
Chloride: 103 mmol/L (ref 98–111)
Creatinine, Ser: 0.7 mg/dL (ref 0.61–1.24)
GFR, Estimated: >60 mL/min (ref >60)
Glucose, Bld: 96 mg/dL (ref 70–99)
Potassium: 3.3 mmol/L — ABNORMAL LOW (ref 3.5–5.1)
Sodium: 135 mmol/L (ref 135–145)

## 2021-06-07 LAB — CBC
HCT: 38.9 % — ABNORMAL LOW (ref 39.0–52.0)
Hemoglobin: 13.6 g/dL (ref 13.0–17.0)
MCH: 32 pg (ref 26.0–34.0)
MCHC: 35 g/dL (ref 30.0–36.0)
MCV: 91.5 fL (ref 80.0–100.0)
Platelets: 195 10*3/uL (ref 150–400)
RBC: 4.25 MIL/uL (ref 4.22–5.81)
RDW: 13.4 % (ref 11.5–15.5)
WBC: 11.1 10*3/uL — ABNORMAL HIGH (ref 4.0–10.5)
nRBC: 0 % (ref 0.0–0.2)

## 2021-06-07 LAB — PHOSPHORUS: Phosphorus: 2.4 mg/dL — ABNORMAL LOW (ref 2.5–4.6)

## 2021-06-07 MED ORDER — POTASSIUM CHLORIDE CRYS ER 20 MEQ PO TBCR
40.0000 meq | EXTENDED_RELEASE_TABLET | Freq: Once | ORAL | Status: AC
  Administered 2021-06-07: 17:00:00 40 meq via ORAL
  Filled 2021-06-07: qty 2

## 2021-06-07 NOTE — Progress Notes (Signed)
Pt resting comfortably , states that he does not need anything at this time. Will continue to monitor , sitter at bedside. States that pt just continuously states that he would like to go home. Call bell within reach.

## 2021-06-07 NOTE — Consult Note (Addendum)
Attestation  I was present with the resident and participated during the history and physical exam of  the patient on 06/07/21.. I discussed the case with the resident and agree with the findings and plan as documented in the residents note.    The clinical course is consistent with delirium. The patient denies any SI on today's evaluation, and his daughter denies any safety concern.  He is at low risk to self-harm based on evaluation as below.  The hope is that he will be followed by his outpatient psychiatrist to attend his depression and alcohol use as his delirium resolves. His daughter agrees with the plan as described below.    The patient demonstrates the following risk factors for suicide: Chronic risk factors for suicide include: psychiatric disorder of depression, substance use disorder, and completed suicide in a family member. Acute risk factors for suicide include: N/A. Protective factors for this patient include: positive social support. Considering these factors, the overall suicide risk at this point appears to be low. Patient is appropriate for outpatient follow up.    The following note was originally written by Lauro Franklin, edited by this writer.  ---------------------------------------------------------------------------------------------------- ---------------------------------------------------------------------------------------------------- Reason for Consult: Suicidal Ideation  Referring Physician: Starleen Arms ,MD   Assessment/Plan: DELOIS TOLBERT is a 70 y.o. male admitted medically for 05/27/2021 10:04 PM for AMS.  He carries the psychiatric diagnoses of Depression and has a past medical history of  hypertension and alcohol abuse.  Psychiatry was consulted for Suicidal Ideation .   He meets criteria for Delirium Secondary to medical illness.  Outpatient psychotropic medications include Lexapro and historically has had a good response to these medications.   He was not compliant with medications prior to admission as evidenced by report he stopped taking it about 1 year ago.  On initial examination, patient laying in bed with safety mittens on.     Patient does not have an SI and is not at risk for self harm.  Patient's daughter has no safety concerns and has locked up all weapons in the house for when he moves in with her.  We will not recommend starting any medications at this time as he still has delirium.  We recommend that he have follow up with psychiatry upon discharge or this is not available then establish with a new PCP for continued management.    Recommendations: -Will not start medications at this time -Follow up with Psychiatry or PCP at discharge - Delirium precautions - Minimize/avoid deliriogenic meds including: anticholinergic, opiates, benzodiazepines           - Maintain hydration, oxygenation, nutrition           - Limit use of restraints and catheters           - Normalize sleep patterns by minimizing nighttime noise, light and interruptions by                clustering care, opening blinds during the day           - Reorient the patient frequently, provide easily visible clock and calendar           - Provide sensory aids like glasses, hearing aids           - Encourage ambulation, regular activities and visitors to maintain cognitive stimulation     Continue rest of care per Primary Team These recommendations have been discussed with the Primary Team Psychiatry will sign off at this time, please  reconsult if any questions arise    JORJE VANATTA is an 70 y.o. male.  HPI: ANTINIO SANDERFER is a 70 y.o. male with medical history significant of hypertension, alcohol abuse, depression presented to the ED via EMS after being found down at home at the time of evaluation by law enforcement.  Per EMS, family had not heard from the patient in several days and called for welfare check.  Police found the patient down on the floor in his  underwear, altered, very cold to touch, and did not have heating on at home.  Extremities very cold to touch with EMS and was shivering.  He had bruising on his arms, face, abdomen, chest, and bilateral knees.  His feet looked purple upon EMS arrival.  Family reported heavy alcohol use.  Found to have Sepsis secondary to Aspiration Pneumonia secondary to COVID 19.   Assessed the patient for orientation and he was oriented to person, place (he did glance at my badge before stating [facility], however, did correctly say that he was in [facility]), and year, not oriented to month (July).  He did correctly answer all 4 ICAM questions.  When doing days of the week backwards he went from Thursday to Tuesday skipping Wednesday.  When asked to count from 42 down to 27 he stopped at 30.  When asked what brought him into the hospital he stated because he was sick.  Discussed with him that he had been found down in his house and when he was brought to the ED found to have a serious lung infection due to COVID-19.  Discussed with him that we have been consulted to see him because of things he had said to the occupational therapist yesterday specifically "throw me out the window" and " I will just go get my 0.38 out of my car and shoot myself."  He stated that he did not mean this and simply says things like this when he gets frustrated or upset.  He states he understands why we had to come see him but that he did not need it and has no intention of hurting himself.  When asked about his past psychiatric history he states he does not have him that he is aware of.  Discussed with him that per his chart he has been diagnosed with depression.  I then asked him what medications he had taken in the past and he stated that he did not remember.  Asked him if Lexapro sounded familiar and he stated that he had been on that but that he had not taken it for about a year.  Nothing a family history of  psychiatric illness he reports that his father did complete suicide.  When asked if he drinks alcohol he reports the occasional drink here and there.  When asked about tobacco products he states he quit smoking about 6 or 7 weeks ago (Per chart patient stopped smoking in 1995).  When asked about illicit substance use he reports that he does not use any illicit substances.  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    He states that he has been retired for about 2 years now.  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  He currently lives alone but does state that one of his best friends mother does live next-door to him.  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  He reports no SI, HI, or AVH.  However during the start of the interview he did state that he needed to move out of the way for people in the corner of the room.  When I told him that there was no one there he then set up a little bit in bed to look into the corner and then sat down and made no further mention of it during the interview.   Shortly after the interview concluded his daughter arrived in the room.  She reports that patient has had depression in the past and that it did worsen after he stopped taking his Lexapro along with his drinking which was about 1 year ago.  She reports that patient has never had hallucinations and that earlier he had thought that he was blocking the consult team from getting around him and so that is why he thought we were in the corner.  She also reports that patient does not do much anymore since his depressive symptoms and drinking got worse.   She reports that after he is discharged from SNF she plans to have him move in with her.  When asked about the comments he had made to the OT therapist yesterday she reports that he normally makes statements like that when he is frustrated and upset but has never made any intention of acting on them and reports that "he never would after having to clean up his father."  She states he has been saying things to this effect for the last 40 years.  When asked if she had any safety concerns about him going home with her she stated she had none.  She states that all weapons in her house have been locked up.  She also states that he will not be drinking anymore once he moved in with her.  Discussed that given his current delirium we would not recommend any medications at this time and that we would recommend he follow-up with psychiatry once he is discharged or after establishing with a PCP he could be cared for by the PCP.  Discussed that delirium can sometimes take a few weeks up to months to resolve fully.  She reported understanding of this plan and had no other concerns at present.   Past Medical History:  Diagnosis Date   Allergy    Arthritis    hands, hips   Hypertension     History reviewed. No pertinent surgical history.  Family History  Problem Relation Age of Onset   Cancer Brother        skin cancer   Heart disease Brother 9       cardiac stenting   Depression Father    Heart disease Father 45       CABG    Social History:  reports that he quit smoking about 27 years ago. His smoking use included cigarettes. He has a 5.00 pack-year smoking history. He has never used smokeless tobacco. He reports current alcohol use of about 10.0 standard drinks per week. He reports that he does not use drugs.  Allergies:  Allergies  Allergen Reactions   Penicillins     Rash     Medications: I have reviewed the patient's  current medications. Prior to Admission:  Medications Prior to Admission   Medication Sig Dispense Refill Last Dose   loperamide (IMODIUM A-D) 2 MG tablet Take 1 tablet (2 mg total) by mouth 4 (four) times daily as needed for diarrhea or loose stools. 30 tablet 0 unk   amLODipine (NORVASC) 10 MG tablet Take 1 tablet (10 mg total) by mouth daily. (Patient not taking: Reported on 05/27/2021) 90 tablet 3 Not Taking   escitalopram (LEXAPRO) 5 MG tablet Take 1 tablet (5 mg total) by mouth at bedtime. Take with a snack.  Do not miss doses. (Patient not taking: Reported on 05/27/2021) 90 tablet 1 Not Taking   lisinopril (ZESTRIL) 40 MG tablet Take 1 tablet (40 mg total) by mouth daily. (Patient not taking: Reported on 05/27/2021) 90 tablet 3 Not Taking   lisinopril-hydrochlorothiazide (ZESTORETIC) 20-25 MG tablet Take 1 tablet by mouth daily. (Patient not taking: Reported on 05/27/2021) 90 tablet 3 Not Taking   psyllium (METAMUCIL SMOOTH TEXTURE) 28 % packet Take 1 packet by mouth daily. Start with 1/2 pack daily for three days then move to the full pack daily. (Patient not taking: Reported on 05/27/2021) 30 packet 11 Not Taking   Scheduled:  aspirin  81 mg Oral Daily   chlorhexidine  15 mL Mouth Rinse BID   enoxaparin (LOVENOX) injection  40 mg Subcutaneous Q24H   feeding supplement  237 mL Oral TID BM   folic acid  1 mg Oral Daily   mouth rinse  15 mL Mouth Rinse q12n4p   multivitamin with minerals  1 tablet Oral Daily   rosuvastatin  10 mg Oral Daily   thiamine  100 mg Oral Daily   Or   thiamine  100 mg Intravenous Daily   Continuous:  sodium chloride     ZOX:WRUEAV chloride, acetaminophen, lip balm, loperamide Anti-infectives (From admission, onward)    Start     Dose/Rate Route Frequency Ordered Stop   05/29/21 1000  remdesivir 100 mg in sodium chloride 0.9 % 100 mL IVPB       See Hyperspace for full Linked Orders Report.   100 mg 200 mL/hr over 30 Minutes Intravenous Daily 05/28/21 0530 06/01/21 0944   05/29/21 0200  vancomycin (VANCOREADY) IVPB 2000 mg/400  mL  Status:  Discontinued        2,000 mg 200 mL/hr over 120 Minutes Intravenous Every 24 hours 05/28/21 0538 05/28/21 0942   05/28/21 1100  levofloxacin (LEVAQUIN) tablet 750 mg        750 mg Oral Daily 05/28/21 0943 05/31/21 1325   05/28/21 1100  metroNIDAZOLE (FLAGYL) tablet 500 mg        500 mg Oral Every 8 hours 05/28/21 0943 06/01/21 2359   05/28/21 0800  metroNIDAZOLE (FLAGYL) IVPB 500 mg  Status:  Discontinued        500 mg 100 mL/hr over 60 Minutes Intravenous Every 8 hours 05/28/21 0523 05/28/21 0942   05/28/21 0800  ceFEPIme (MAXIPIME) 2 g in sodium chloride 0.9 % 100 mL IVPB  Status:  Discontinued        2 g 200 mL/hr over 30 Minutes Intravenous Every 8 hours 05/28/21 0538 05/28/21 0943   05/28/21 0530  remdesivir 200 mg in sodium chloride 0.9% 250 mL IVPB       See Hyperspace for full Linked Orders Report.   200 mg 580 mL/hr over 30 Minutes Intravenous Once 05/28/21 0530 05/28/21 0754   05/27/21 2330  ceFEPIme (MAXIPIME) 2 g in sodium  chloride 0.9 % 100 mL IVPB        2 g 200 mL/hr over 30 Minutes Intravenous  Once 05/27/21 2315 05/28/21 0205   05/27/21 2330  metroNIDAZOLE (FLAGYL) IVPB 500 mg        500 mg 100 mL/hr over 60 Minutes Intravenous  Once 05/27/21 2315 05/28/21 0205   05/27/21 2330  vancomycin (VANCOREADY) IVPB 2000 mg/400 mL        2,000 mg 200 mL/hr over 120 Minutes Intravenous  Once 05/27/21 2315 05/28/21 0422       Results for orders placed or performed during the hospital encounter of 05/27/21 (from the past 48 hour(s))  CBC     Status: Abnormal   Collection Time: 06/07/21 12:55 AM  Result Value Ref Range   WBC 11.1 (H) 4.0 - 10.5 K/uL   RBC 4.25 4.22 - 5.81 MIL/uL   Hemoglobin 13.6 13.0 - 17.0 g/dL   HCT 96.0 (L) 45.4 - 09.8 %   MCV 91.5 80.0 - 100.0 fL   MCH 32.0 26.0 - 34.0 pg   MCHC 35.0 30.0 - 36.0 g/dL   RDW 11.9 14.7 - 82.9 %   Platelets 195 150 - 400 K/uL   nRBC 0.0 0.0 - 0.2 %    Comment: Performed at [facility], [address], [place], [place] [address]  Basic metabolic panel     Status: Abnormal   Collection Time: 06/07/21 12:55 AM  Result Value Ref Range   Sodium 135 135 - 145 mmol/L   Potassium 3.3 (L) 3.5 - 5.1 mmol/L   Chloride 103 98 - 111 mmol/L   CO2 23 22 - 32 mmol/L   Glucose, Bld 96 70 - 99 mg/dL    Comment: Glucose reference range applies only to samples taken after fasting for at least 8 hours.   BUN 21 8 - 23 mg/dL   Creatinine, Ser 0.86 0.61 - 1.24 mg/dL   Calcium 8.3 (L) 8.9 - 10.3 mg/dL   GFR, Estimated >57 >84 mL/min    Comment: (NOTE) Calculated using the CKD-EPI Creatinine Equation (2021)    Anion gap 9 5 - 15    Comment: Performed at [facility], [address] [place], [place] [address]  Phosphorus     Status: Abnormal   Collection Time: 06/07/21 12:55 AM  Result Value Ref Range   Phosphorus 2.4 (L) 2.5 - 4.6 mg/dL    Comment: Performed at [facility], [address], [place], [place] [address]    CT PELVIS WO CONTRAST  Result Date: 06/07/2021 CLINICAL DATA:  Possible right greater trochanteric fracture. EXAM: CT PELVIS WITHOUT CONTRAST TECHNIQUE: Multidetector CT imaging of the pelvis was performed following the standard protocol without intravenous contrast. COMPARISON:  None. FINDINGS: Urinary Tract:  Bladder is distended. Bowel: No bowel dilatation. Diverticular disease noted sigmoid colon without diverticulitis. Vascular/Lymphatic: No pathologically enlarged lymph nodes. No significant vascular abnormality seen. Reproductive:  No mass or other significant abnormality Other:  No intraperitoneal free fluid. Musculoskeletal: No evidence for an acute fracture in the bony pelvis or either proximal femur. Advanced degenerative changes are seen in the right hip with loss of joint space, subchondral sclerosis and subchondral cyst formation. More modest degenerative changes are noted in the left hip. SI joints and symphysis pubis unremarkable. IMPRESSION: No acute bony  abnormality. Specifically, no greater trochanteric fracture involving the proximal right femur as suggested on recent x-ray. Bilateral degenerative hip disease, right greater than left. Electronically  Signed   By: Kennith Center M.D.   On: 06/07/2021 12:18     Blood pressure 140/88, pulse 77, temperature 97.7 F (36.5 C), temperature source Oral, resp. rate 15, height 6\' 2"  (1.88 m), weight 87.8 kg, SpO2 97 %. Psychiatric Specialty Exam: Physical Exam Vitals and nursing note reviewed.  Constitutional:      General: He is not in acute distress.    Appearance: Normal appearance. He is normal weight. He is not ill-appearing or toxic-appearing.  HENT:     Head: Normocephalic and atraumatic.  Pulmonary:     Effort: Pulmonary effort is normal.  Musculoskeletal:        General: Normal range of motion.  Neurological:     Mental Status: He is alert. He is disoriented.    Review of Systems  Respiratory:  Negative for cough and shortness of breath.   Cardiovascular:  Negative for chest pain.  Gastrointestinal:  Negative for abdominal pain, constipation, diarrhea, nausea and vomiting.  Neurological:  Negative for weakness and headaches.  Psychiatric/Behavioral:  Negative for agitation, dysphoric mood, self-injury, sleep disturbance and suicidal ideas. The patient is not nervous/anxious.    Blood pressure 140/88, pulse 77, temperature 97.7 F (36.5 C), temperature source Oral, resp. rate 15, height 6\' 2"  (1.88 m), weight 87.8 kg, SpO2 97 %.Body mass index is 24.85 kg/m.  General Appearance: Casual, Disheveled, and in safety mits  Eye Contact:  Fair  Speech:  Normal Rate and a mix of clear/coherent with some garbled words  Volume:  Normal  Mood:  Euthymic  Affect:  Congruent  Thought Process:  Occasionally coherent and occasionally disorganized  Orientation:  Other:  Oriented to person, city, and year not month or situation.  Thought Content:  Logical  Suicidal Thoughts:  No  Homicidal  Thoughts:  No  Memory:  Immediate;   Poor Recent;   Poor  Judgement:  Intact  Insight:  Present  Psychomotor Activity:  Normal  Concentration:  Concentration: Poor and Attention Span: Poor  Recall:  Poor  Fund of Knowledge:  Fair  Language:  Good  Akathisia:  Negative  Handed:  Right  AIMS (if indicated):     Assets:  Resilience Social Support  ADL's:  Intact  Cognition:  Impaired,  Mild  Sleep:   good     Ilisa Hayworth 06/07/2021, 4:53 PM

## 2021-06-07 NOTE — Progress Notes (Signed)
Pt resting comfortably in bed. Asked pt if he needed anything he states that he is okay. Will continue to monitor, sitter at bedside.

## 2021-06-07 NOTE — Progress Notes (Signed)
Speech Language Pathology Treatment: Dysphagia  Patient Details Name: Fred Graham MRN: [id number] DOB: [birth year] Today's Date: 06/07/2021 Time: 0086-7619 SLP Time Calculation (min) (ACUTE ONLY): 22 min  Assessment / Plan / Recommendation Clinical Impression  Pt seen for follow-up dysphagia therapy with daughter present at bedside. Pt alert and repositioned upright in bed for completion of oral care prior to trials of ice chips, thin liquids, NTL and bites of regular textures. X1 overt coughing noted with ice chips and throat clear x1 post consecutive swallows of thin liquids via straw. Regular textures were consumed without difficulty and orally cleared. Pt's reduced mentation/attention continues to impact his safety with PO intake, often communicating during consumption. Daughter at bedside reports thinning NTL with a little bit of water as pt has been refusing thickened liquids. Re-educated daughter regarding diet recommendation and results of MBS which reported pooling of liquids in pharynx and subsequent silent aspiration of thin liquids with pill. Daughter expressed agreement to education/recommendations. Will f/u to determine readiness for repeat instrumental testing as clinical s/sx of aspiration appeared reduced even with consecutive swallows. RN notified.     HPI HPI: 70yo male admitted 05/27/21 after being found down at home. Per EMS, family had not heard from the patient in several days and called for welfare check. PMH: HTN, alcohol abuse, depression  Admitted with aspiration pna and COVID 19. CXR 12/13 showed not new findings.  Intake listed as 60 mL only - no meal intake documented.  Pt WBC is 10.8 today but trending down.  Follow up to assure po tolerance indicated and for family education.      SLP Plan  Continue with current plan of care      Recommendations for follow up therapy are one component of a multi-disciplinary discharge planning process, led by the attending  physician.  Recommendations may be updated based on patient status, additional functional criteria and insurance authorization.    Recommendations  Diet recommendations: Dysphagia 3 (mechanical soft);Nectar-thick liquid Liquids provided via: Cup;Straw Medication Administration: Crushed with puree Supervision: Patient able to self feed;Full supervision/cueing for compensatory strategies Compensations: Slow rate;Small sips/bites;Minimize environmental distractions Postural Changes and/or Swallow Maneuvers: Seated upright 90 degrees                Oral Care Recommendations: Oral care BID Follow Up Recommendations: Skilled nursing-short term rehab (<3 hours/day) Assistance recommended at discharge: Frequent or constant Supervision/Assistance SLP Visit Diagnosis: Dysphagia, oropharyngeal phase (R13.12) Plan: Continue with current plan of care          [name], MA, CCC-SLP Acute Rehabilitation Services Office Number: [phone]  [name]  06/07/2021, 3:05 PM

## 2021-06-07 NOTE — Progress Notes (Signed)
PROGRESS NOTE                                                                                                                                                                                                             Patient Demographics:    Fred Graham, is a 70 y.o. male, DOB - [birth year], TMB:[id number]  Outpatient Primary MD for the patient is [name], NP    LOS - 10  Admit date - 05/27/2021    Chief Complaint  Patient presents with   Altered Mental Status       Brief Narrative  Fred Graham is a 70 y.o. male with medical history significant of hypertension, alcohol abuse, depression presented to the ED via EMS after being found down at home with confusion/hypothermia (welfare check by law enforcement as family had not heard from him).  Patient was found to have sepsis due to aspiration pneumonia/COVID-19 infection and subsequently admitted to the hospitalist service.  Further hospital course was complicated by alcohol withdrawal syndrome/DTs. See below for further details.    Subjective:   Mildly confused but easily redirectable-answers most of my questions appropriately.   Assessment  & Plan :   Severe sepsis due to COVID-19 infection/aspiration pneumonia: Sepsis physiology has resolved-he was treated with broad-spectrum antimicrobial therapy/Remdesivir.  Staph epidermidis bacteremia: Thought to be a contaminant and not a real infection.  Repeat blood cultures on 12/13 are negative.  Hyponatremia: Suggestive of SIADH-treated with Samsca-sodium levels now stable.  Follow periodically.  Rhabdomyolysis: Improved after IVF.  Acute metabolic encephalopathy: Improving-felt to be due to sepsis/PNA-subsequently developed alcohol withdrawal symptoms.  MRI brain negative for acute abnormalities.  EEG without seizures.  EtOH withdrawal/DTs: Slowly improving-some minimal lingering confusion persists.  No longer on  CIWA protocol.  Continue thiamine.  Chronic systolic heart failure: Euvolemic on exam-does not require diuretics.  BP soft to restart beta-blocker.  HTN: BP stable without the use of any antihypertensives.  A.m. cortisol levels were appropriate.  Resume when able.  Minimally elevated troponin: Not consistent with ACS-cardiology does not recommend any further work-up.  Nondisplaced right fifth/sixth fractures: Continue with supportive care  Osteoarthritis bilateral hip: Recent x-ray suggestive of possible right greater trochanter fracture-however CT pelvis on 12/21 without any fracture.  Continue supportive care  Suicidal intention: Expressed to OT on 12/20-prior MD has consulted psychiatry-awaiting  further recommendations.  Safety sitter in place.  History of depression: On Lexapro at home-currently on hold-resume if okay with psychiatry.  Physical deconditioning: PT/OT following-recommendations are for SNF.      Condition - Guarded  Family Communication  : D/W daughter by phone 12/20  Code Status :  Full  Consults  :  None  PUD Prophylaxis : Pepcid  Disposition: Will need SNF placement once he finishes COVID quarantine, remains inpatient given fluctuating blood pressure, and now with suicidal ideations.    Procedures  :     TTE -   EEG - non acute  CT head and C-spine.  Nonacute.  CTA - Patent three-vessel runoff bilaterally. Additional ancillary findings as above  CT chest -  Nondisplaced right anterolateral 5th and 6th rib fractures. No pneumothorax is seen. Suspected mild aspiration in the right middle lobe and bilateral lung bases. Aortic Atherosclerosis (ICD10-I70.0).       Disposition Plan  : SNF  Status is: Inpatient  DVT Prophylaxis  :    enoxaparin (LOVENOX) injection 40 mg Start: 05/28/21 1600     Lab Results  Component Value Date   PLT 195 06/07/2021    Diet :  Diet Order             DIET SOFT Room service appropriate? Yes; Fluid  consistency: Nectar Thick  Diet effective now                    Inpatient Medications  Scheduled Meds:  aspirin  81 mg Oral Daily   chlorhexidine  15 mL Mouth Rinse BID   enoxaparin (LOVENOX) injection  40 mg Subcutaneous Q24H   feeding supplement  237 mL Oral TID BM   folic acid  1 mg Oral Daily   mouth rinse  15 mL Mouth Rinse q12n4p   multivitamin with minerals  1 tablet Oral Daily   rosuvastatin  10 mg Oral Daily   thiamine  100 mg Oral Daily   Or   thiamine  100 mg Intravenous Daily   Continuous Infusions:  sodium chloride     PRN Meds:.sodium chloride, acetaminophen, lip balm, loperamide  Antibiotics  :    Anti-infectives (From admission, onward)    Start     Dose/Rate Route Frequency Ordered Stop   05/29/21 1000  remdesivir 100 mg in sodium chloride 0.9 % 100 mL IVPB       See Hyperspace for full Linked Orders Report.   100 mg 200 mL/hr over 30 Minutes Intravenous Daily 05/28/21 0530 06/01/21 0944   05/29/21 0200  vancomycin (VANCOREADY) IVPB 2000 mg/400 mL  Status:  Discontinued        2,000 mg 200 mL/hr over 120 Minutes Intravenous Every 24 hours 05/28/21 0538 05/28/21 0942   05/28/21 1100  levofloxacin (LEVAQUIN) tablet 750 mg        750 mg Oral Daily 05/28/21 0943 05/31/21 1325   05/28/21 1100  metroNIDAZOLE (FLAGYL) tablet 500 mg        500 mg Oral Every 8 hours 05/28/21 0943 06/01/21 2359   05/28/21 0800  metroNIDAZOLE (FLAGYL) IVPB 500 mg  Status:  Discontinued        500 mg 100 mL/hr over 60 Minutes Intravenous Every 8 hours 05/28/21 0523 05/28/21 0942   05/28/21 0800  ceFEPIme (MAXIPIME) 2 g in sodium chloride 0.9 % 100 mL IVPB  Status:  Discontinued        2 g 200 mL/hr over 30 Minutes Intravenous Every  8 hours 05/28/21 0538 05/28/21 0943   05/28/21 0530  remdesivir 200 mg in sodium chloride 0.9% 250 mL IVPB       See Hyperspace for full Linked Orders Report.   200 mg 580 mL/hr over 30 Minutes Intravenous Once 05/28/21 0530 05/28/21 0754    05/27/21 2330  ceFEPIme (MAXIPIME) 2 g in sodium chloride 0.9 % 100 mL IVPB        2 g 200 mL/hr over 30 Minutes Intravenous  Once 05/27/21 2315 05/28/21 0205   05/27/21 2330  metroNIDAZOLE (FLAGYL) IVPB 500 mg        500 mg 100 mL/hr over 60 Minutes Intravenous  Once 05/27/21 2315 05/28/21 0205   05/27/21 2330  vancomycin (VANCOREADY) IVPB 2000 mg/400 mL        2,000 mg 200 mL/hr over 120 Minutes Intravenous  Once 05/27/21 2315 05/28/21 0422          Dewayne Shorter Samantha Olivera M.D on 06/07/2021 at 2:45 PM  To page go to [email or web address]   [facility] -  Office  [phone]  See all Orders from today for further details    Objective:   Vitals:   06/06/21 2300 06/07/21 0330 06/07/21 0833 06/07/21 1153  BP: (!) 172/95 (!) 175/97 (!) 155/88 101/70  Pulse: 75 78 92 86  Resp: Temp: 98.1 F (36.7 C) 98.1 F (36.7 C) 97.9 F (36.6 C) 97.6 F (36.4 C)  TempSrc: Axillary Axillary Axillary Oral  SpO2: 96% 96% 94% 96%  Weight:      Height:        Wt Readings from Last 3 Encounters:  06/06/21 87.8 kg  05/05/19 93 kg  10/20/18 95.3 kg     Intake/Output Summary (Last 24 hours) at 06/07/2021 1445 Last data filed at 06/07/2021 1300 Gross per 24 hour  Intake 600 ml  Output 2925 ml  Net -2325 ml      Physical Exam Gen Exam:Alert awake-not in any distress HEENT:atraumatic, normocephalic Chest: B/L clear to auscultation anteriorly CVS:S1S2 regular Abdomen:soft non tender, non distended Extremities:no edema Neurology: Non focal Skin: no rash      Data Review:    CBC Recent Labs  Lab 06/01/21 0040 06/02/21 0101 06/04/21 0604 06/07/21 0055  WBC 10.8* 11.1* 11.0* 11.1*  HGB 15.1 14.6 16.5 13.6  HCT 43.0 42.0 47.0 38.9*  PLT 349 334 273 195  MCV 90.1 90.5 91.8 91.5  MCH 31.7 31.5 32.2 32.0  MCHC 35.1 34.8 35.1 35.0  RDW 12.8 13.1 13.2 13.4  LYMPHSABS 1.4  --   --   --   MONOABS 0.6  --   --   --   EOSABS 0.0  --   --   --   BASOSABS 0.0  --    --   --      Electrolytes Recent Labs  Lab 06/01/21 0040 06/02/21 0101 06/03/21 0143 06/04/21 0604 06/07/21 0055  NA 134* 134* 132* 135 135  K 3.6 3.1* 3.7 3.5 3.3*  CL 98 99 99 100 103  CO2 GLUCOSE 117* 119* 116* 89 96  BUN 22 31* 28* 27* 21  CREATININE 0.79 0.82 0.73 0.73 0.70  CALCIUM 8.7* 8.5* 8.3* 8.5* 8.3*  AST 60* 46*  --   --   --   ALT 75* 68*  --   --   --   ALKPHOS 53 41  --   --   --   BILITOT 0.9  0.6  --   --   --   ALBUMIN 3.0* 2.9*  --   --   --   MG 2.1 2.1  --   --   --   CRP 1.2*  --   --   --   --   DDIMER 0.97*  --   --   --   --   PROCALCITON 0.15  --   --   --   --   BNP 155.0*  --   --   --   --      ------------------------------------------------------------------------------------------------------------------ No results for input(s): CHOL, HDL, LDLCALC, TRIG, CHOLHDL, LDLDIRECT in the last 72 hours.  No results found for: HGBA1C  No results for input(s): TSH, T4TOTAL, T3FREE, THYROIDAB in the last 72 hours.  Invalid input(s): FREET3  ------------------------------------------------------------------------------------------------------------------ ID Labs Recent Labs  Lab 06/01/21 0040 06/02/21 0101 06/03/21 0143 06/04/21 0604 06/07/21 0055  WBC 10.8* 11.1*  --  11.0* 11.1*  PLT 349 334  --  273 195  CRP 1.2*  --   --   --   --   DDIMER 0.97*  --   --   --   --   PROCALCITON 0.15  --   --   --   --   CREATININE 0.79 0.82 0.73 0.73 0.70    Cardiac Enzymes No results for input(s): CKMB, TROPONINI, MYOGLOBIN in the last 168 hours.  Invalid input(s): CK      Radiology Reports DG Knee 1-2 Views Right  Result Date: 06/05/2021 CLINICAL DATA:  Recent fall with right knee pain, initial EXAM: RIGHT KNEE - 2 VIEW COMPARISON:  None. FINDINGS: No acute fracture or dislocation is noted. No joint effusion is seen. Mild degenerative changes are noted in the patellofemoral joint. IMPRESSION: Degenerative change without  acute abnormality. Electronically Signed   By: Alcide Clever M.D.   On: 06/05/2021 15:37   CT PELVIS WO CONTRAST  Result Date: 06/07/2021 CLINICAL DATA:  Possible right greater trochanteric fracture. EXAM: CT PELVIS WITHOUT CONTRAST TECHNIQUE: Multidetector CT imaging of the pelvis was performed following the standard protocol without intravenous contrast. COMPARISON:  None. FINDINGS: Urinary Tract:  Bladder is distended. Bowel: No bowel dilatation. Diverticular disease noted sigmoid colon without diverticulitis. Vascular/Lymphatic: No pathologically enlarged lymph nodes. No significant vascular abnormality seen. Reproductive:  No mass or other significant abnormality Other:  No intraperitoneal free fluid. Musculoskeletal: No evidence for an acute fracture in the bony pelvis or either proximal femur. Advanced degenerative changes are seen in the right hip with loss of joint space, subchondral sclerosis and subchondral cyst formation. More modest degenerative changes are noted in the left hip. SI joints and symphysis pubis unremarkable. IMPRESSION: No acute bony abnormality. Specifically, no greater trochanteric fracture involving the proximal right femur as suggested on recent x-ray. Bilateral degenerative hip disease, right greater than left. Electronically Signed   By: Kennith Center M.D.   On: 06/07/2021 12:18   MR BRAIN WO CONTRAST  Result Date: 06/05/2021 CLINICAL DATA:  Delirium; confusion. EXAM: MRI HEAD WITHOUT CONTRAST TECHNIQUE: Multiplanar, multiecho pulse sequences of the brain and surrounding structures were obtained without intravenous contrast. COMPARISON:  Head CT 05/28/2021. FINDINGS: Brain: Intermittently motion degraded examination, limiting evaluation. Most notably, there is mild-to-moderate motion degradation of the axial T2 FLAIR sequence. Mild generalized cerebral atrophy. Moderate multifocal T2 FLAIR hyperintense signal abnormality within the cerebral white matter, nonspecific but  compatible with chronic small vessel ischemic disease. Subcentimeter focus of SWI signal loss within  the posterior left frontal lobe, which may reflect a chronic parenchymal microhemorrhage or cavernoma (series 12, image 44). There is no acute infarct. No evidence of an intracranial mass. No extra-axial fluid collection. No midline shift. Vascular: Maintained flow voids within the proximal large arterial vessels. Skull and upper cervical spine: No focal suspicious marrow lesion. Sinuses/Orbits: Visualized orbits show no acute finding. Moderate fluid level and background mild mucosal thickening within the left maxillary sinus. Small fluid level and background trace mucosal thickening within the right sphenoid sinus. Mild mucosal thickening within the left sphenoid sinus. Mild mucosal thickening within the left greater than right ethmoid sinuses. Trace mucosal thickening within the left frontal sinus. IMPRESSION: Intermittently motion degraded examination, as described. No evidence of acute intracranial abnormality. Moderate chronic small vessel ischemic changes within the cerebral white matter. Subcentimeter focus of susceptibility-weighted signal loss within the posterior left frontal lobe, which may reflect a chronic parenchymal microhemorrhage or cavernoma. Mild generalized cerebral atrophy. Paranasal sinus disease, most notably left maxillary and right sphenoid. Correlate for acute sinusitis. Electronically Signed   By: Jackey Loge D.O.   On: 06/05/2021 12:21   DG HIP UNILAT WITH PELVIS 1V RIGHT  Result Date: 06/05/2021 CLINICAL DATA:  Recent fall with hip pain, initial encounter EXAM: DG HIP (WITH OR WITHOUT PELVIS) 3V RIGHT COMPARISON:  12/06/2013 FINDINGS: Degenerative changes of the right hip joint are seen. Lucency is noted along the greater trochanter consistent with an undisplaced greater trochanter fracture. No other fractures are seen. IMPRESSION: Lucency along the right greater trochanter  consistent with undisplaced fracture. CT of the pelvis may be helpful to assess for more occult injury. Degenerative changes of the right hip joint. Electronically Signed   By: Alcide Clever M.D.   On: 06/05/2021 15:37

## 2021-06-07 NOTE — Progress Notes (Signed)
This chaplain responded to the consult for information on completing dual POA in the hospital.    The chaplain talked to the Pt. daughter-Sheila outside the Pt. room.  The chaplain understands the Pt. has entrance precautions and lacks clarity for decision making at this time. The chaplain educated Fred Graham on the [place] process in the hospital. Fred Graham is interested in combining HCPOA and durable POA and will F/U if needed.  Chaplain Stephanie Acre [phone]

## 2021-06-07 NOTE — Plan of Care (Signed)
°  Problem: Education: Goal: Knowledge of General Education information will improve Description: Including pain rating scale, medication(s)/side effects and non-pharmacologic comfort measures Outcome: Progressing   Problem: Health Behavior/Discharge Planning: Goal: Ability to manage health-related needs will improve Outcome: Progressing   Problem: Clinical Measurements: Goal: Ability to maintain clinical measurements within normal limits will improve Outcome: Progressing Goal: Will remain free from infection Outcome: Progressing Goal: Diagnostic test results will improve Outcome: Progressing Goal: Respiratory complications will improve Outcome: Progressing Goal: Cardiovascular complication will be avoided Outcome: Progressing   Problem: Activity: Goal: Risk for activity intolerance will decrease Outcome: Progressing   Problem: Nutrition: Goal: Adequate nutrition will be maintained Outcome: Progressing   Problem: Coping: Goal: Level of anxiety will decrease Outcome: Progressing   Problem: Elimination: Goal: Will not experience complications related to bowel motility Outcome: Progressing Goal: Will not experience complications related to urinary retention Outcome: Progressing   Problem: Pain Managment: Goal: General experience of comfort will improve Outcome: Progressing   Problem: Safety: Goal: Ability to remain free from injury will improve Outcome: Progressing   Problem: Skin Integrity: Goal: Risk for impaired skin integrity will decrease Outcome: Progressing   Problem: Education: Goal: Knowledge of risk factors and measures for prevention of condition will improve Outcome: Progressing   Problem: Coping: Goal: Psychosocial and spiritual needs will be supported Outcome: Progressing   Problem: Respiratory: Goal: Will maintain a patent airway Outcome: Progressing Goal: Complications related to the disease process, condition or treatment will be avoided or  minimized Outcome: Progressing   Problem: Fluid Volume: Goal: Hemodynamic stability will improve Outcome: Progressing   Problem: Clinical Measurements: Goal: Diagnostic test results will improve Outcome: Progressing Goal: Signs and symptoms of infection will decrease Outcome: Progressing   Problem: Respiratory: Goal: Ability to maintain adequate ventilation will improve Outcome: Progressing   Problem: Education: Goal: Knowledge of risk factors and measures for prevention of condition will improve Outcome: Progressing   Problem: Coping: Goal: Psychosocial and spiritual needs will be supported Outcome: Progressing   Problem: Respiratory: Goal: Will maintain a patent airway Outcome: Progressing Goal: Complications related to the disease process, condition or treatment will be avoided or minimized Outcome: Progressing

## 2021-06-08 LAB — BASIC METABOLIC PANEL
Anion gap: 9 (ref 5–15)
BUN: 22 mg/dL (ref 8–23)
CO2: 23 mmol/L (ref 22–32)
Calcium: 8.3 mg/dL — ABNORMAL LOW (ref 8.9–10.3)
Chloride: 99 mmol/L (ref 98–111)
Creatinine, Ser: 0.66 mg/dL (ref 0.61–1.24)
GFR, Estimated: >60 mL/min (ref >60)
Glucose, Bld: 98 mg/dL (ref 70–99)
Potassium: 3.4 mmol/L — ABNORMAL LOW (ref 3.5–5.1)
Sodium: 131 mmol/L — ABNORMAL LOW (ref 135–145)

## 2021-06-08 LAB — MAGNESIUM: Magnesium: 2.3 mg/dL (ref 1.7–2.4)

## 2021-06-08 MED ORDER — ACETAMINOPHEN 325 MG PO TABS
650.0000 mg | ORAL_TABLET | Freq: Four times a day (QID) | ORAL | Status: DC | PRN
  Administered 2021-06-08: 04:00:00 650 mg via ORAL
  Filled 2021-06-08: qty 2

## 2021-06-08 MED ORDER — LABETALOL HCL 5 MG/ML IV SOLN
5.0000 mg | INTRAVENOUS | Status: DC | PRN
Start: 2021-06-08 — End: 2021-06-12
  Administered 2021-06-08 (×2): 5 mg via INTRAVENOUS
  Administered 2021-06-08: 04:00:00 10 mg via INTRAVENOUS
  Filled 2021-06-08 (×2): qty 4

## 2021-06-08 MED ORDER — POTASSIUM CHLORIDE CRYS ER 20 MEQ PO TBCR
40.0000 meq | EXTENDED_RELEASE_TABLET | Freq: Once | ORAL | Status: AC
  Administered 2021-06-08: 09:00:00 40 meq via ORAL
  Filled 2021-06-08: qty 2

## 2021-06-08 MED ORDER — CARVEDILOL 6.25 MG PO TABS
6.2500 mg | ORAL_TABLET | Freq: Two times a day (BID) | ORAL | Status: DC
  Administered 2021-06-08 – 2021-06-09 (×3): 6.25 mg via ORAL
  Filled 2021-06-08 (×3): qty 1

## 2021-06-08 NOTE — Progress Notes (Signed)
SLP Cancellation Note  Patient Details Name: Fred Graham MRN: [id number] DOB: Aug 23, 1950   Cancelled treatment:       Reason Eval/Treat Not Completed: Pt working with PT. Will f/u as able.    [name], MA, CCC-SLP Acute Rehabilitation Services Office Number: [phone]  [name] 06/08/2021, 3:05 PM

## 2021-06-08 NOTE — Progress Notes (Signed)
Physical Therapy Treatment Patient Details Name: Fred Graham MRN: [id number] DOB: [birth year] Today's Date: 06/08/2021   History of Present Illness 70 y.o. male with medical history significant of hypertension, alcohol abuse, depression presented to the ED 05/27/21 via EMS after being found down at home at the time of evaluation by law enforcement.  Per EMS, family had not heard from the patient in several days and called for welfare check.Admitted with aspiration pna and COVID 19. CT chest Nondisplaced right anterolateral 5th and 6th rib fractures ETOH. PMH: hypertension, alcohol abuse, depression    PT Comments    Patient more confused this p.m. than when last seen. 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 Would at times attend to task and complete reps of desired exercise, but then became less cooperative to the point of only completing PROM. Patient positioned up in the bed and into chair-like position at end of session.    Recommendations for follow up therapy are one component of a multi-disciplinary discharge planning process, led by the attending physician.  Recommendations may be updated based on patient status, additional functional criteria and insurance authorization.  Follow Up Recommendations  Skilled nursing-short term rehab (<3 hours/day)     Assistance Recommended at Discharge Frequent or constant Supervision/Assistance  Equipment Recommendations  Other (comment) (TBD)    Recommendations for Other Services       Precautions / Restrictions Precautions Precautions: Fall Precaution Comments: found down at home     Mobility  Bed Mobility Overal bed mobility: Needs Assistance Bed Mobility: Rolling Rolling: Total assist         General bed mobility comments: attempted bed mobility with pt unable to focus and stay on task (pulling his arm away from therapist and continuing to talk about cars)    Transfers                    General transfer comment: unable due to decr cognition    Ambulation/Gait                   Stairs             Wheelchair Mobility    Modified Rankin (Stroke Patients Only)       Balance                                            Cognition Arousal/Alertness: Awake/alert Behavior During Therapy: Flat affect Overall Cognitive Status: Impaired/Different from baseline Area of Impairment: Orientation;Attention;Memory;Following commands;Safety/judgement;Awareness;Problem solving                 Orientation Level: Disoriented to;Place;Time;Situation Current Attention Level: Focused Memory: Decreased recall of precautions;Decreased short-term memory Following Commands: Follows one step commands inconsistently Safety/Judgement: Decreased awareness of safety;Decreased awareness of deficits   Problem Solving: Slow processing;Decreased initiation;Difficulty sequencing;Requires verbal cues;Requires tactile cues General Comments: pt talking about working on cars and stating he sees cars outside his window (pt's view is a brick wall); at one point talking to someone not present in room;        Exercises General Exercises - Lower Extremity Ankle Circles/Pumps: AAROM;Both;10 reps;Supine Heel Slides: AAROM;Strengthening;Both;10 reps (resisted extension) Other Exercises Other Exercises: attempted bridging x 2 reps and then would not try again Other Exercises: hooklying PROM bil hips with pt initially reporting pain/stiffness but then reporting he felt better  with movement    General Comments        Pertinent Vitals/Pain Pain Assessment: Faces Faces Pain Scale: Hurts little more Pain Location: bil hips/knees with initial movement Pain Descriptors / Indicators: Aching Pain Intervention(s): Limited activity within patient's tolerance;Monitored during session;Repositioned    Home Living                          Prior Function             PT Goals (current goals can now be found in the care plan section) Acute Rehab PT Goals Patient Stated Goal: to go home Time For Goal Achievement: 06/11/21 Potential to Achieve Goals: Fair Progress towards PT goals: Not progressing toward goals - comment (more confused this session)    Frequency    Min 2X/week      PT Plan Current plan remains appropriate    Co-evaluation              AM-PAC PT "6 Clicks" Mobility   Outcome Measure  Help needed turning from your back to your side while in a flat bed without using bedrails?: Total Help needed moving from lying on your back to sitting on the side of a flat bed without using bedrails?: Total Help needed moving to and from a bed to a chair (including a wheelchair)?: Total Help needed standing up from a chair using your arms (e.g., wheelchair or bedside chair)?: Total Help needed to walk in hospital room?: Total Help needed climbing 3-5 steps with a railing? : Total 6 Click Score: 6    End of Session   Activity Tolerance: Treatment limited secondary to medical complications (Comment) (confusion) Patient left: in bed;with call bell/phone within reach;with bed alarm set Nurse Communication: Other (comment) (incr confusion compared to previous visit) PT Visit Diagnosis: Other abnormalities of gait and mobility (R26.89);Repeated falls (R29.6);Muscle weakness (generalized) (M62.81);History of falling (Z91.81);Difficulty in walking, not elsewhere classified (R26.2);Pain     Time: 9767-3419 PT Time Calculation (min) (ACUTE ONLY): 20 min  Charges:  $Therapeutic Exercise: 8-22 mins                      Jerolyn Center, PT Acute Rehabilitation Services  Pager [phone] Office [phone]    Zena Amos 06/08/2021, 3:34 PM

## 2021-06-08 NOTE — TOC Progression Note (Signed)
Transition of Care [facility]) - Progression Note    Patient Details  Name: Fred Graham MRN: [id number] Date of Birth: May 17, 1951  Transition of Care [facility]) CM/SW Contact  Mearl Latin, LCSW Phone Number: 06/08/2021, 10:28 AM  Clinical Narrative:    CSW requested [facility] review referral.    Expected Discharge Plan: Skilled Nursing Facility Barriers to Discharge: SNF Covid, Continued Medical Work up  Expected Discharge Plan and Services Expected Discharge Plan: Skilled Nursing Facility In-house Referral: Clinical Social Work   Post Acute Care Choice: Skilled Nursing Facility Living arrangements for the past 2 months: Single Family Home                                       Social Determinants of Health (SDOH) Interventions    Readmission Risk Interventions No flowsheet data found.

## 2021-06-08 NOTE — Progress Notes (Addendum)
PROGRESS NOTE                                                                                                                                                                                                             Patient Demographics:    Fred Graham, is a 70 y.o. male, DOB - 1950/10/19, ZOX:[id number]  Outpatient Primary MD for the patient is [name], NP    LOS - 11  Admit date - 05/27/2021    Chief Complaint  Patient presents with   Altered Mental Status       Brief Narrative  Fred Graham is a 70 y.o. male with medical history significant of hypertension, alcohol abuse, depression presented to the ED via EMS after being found down at home with confusion/hypothermia (welfare check by law enforcement as family had not heard from him).  Patient was found to have sepsis due to aspiration pneumonia/COVID-19 infection and subsequently admitted to the hospitalist service.  Further hospital course was complicated by alcohol withdrawal syndrome/DTs. See below for further details.    Subjective:   More awake and alert compared to the past few days.  Answering simple questions appropriately.  Appears restless at times.   Assessment  & Plan :   Severe sepsis due to COVID-19 infection/aspiration pneumonia: Sepsis physiology has resolved-he was treated with broad-spectrum antimicrobial therapy/Remdesivir.  Has completed more than 10 days of isolation-does not require isolation at this point.  Staph epidermidis bacteremia: Thought to be a contaminant and not a real infection.  Repeat blood cultures on 12/13 are negative.  Hyponatremia: Suggestive of SIADH-treated with Samsca-sodium levels now stable.  Follow periodically.  Hypokalemia: Replete and recheck.  Rhabdomyolysis: Improved after IVF.  Acute metabolic encephalopathy: Improving-felt to be due to sepsis/PNA-subsequently developed alcohol withdrawal symptoms.   MRI brain negative for acute abnormalities.  EEG without seizures.  Mentation gradually improving.  EtOH withdrawal/DTs: Mentation continues to improve-some lingering confusion at times-no longer on CIWA protocol-remains on thiamine.  Plan is to continue supportive care-maintain delirium precautions.   Chronic systolic heart failure: Euvolemic on exam-does not require diuretics.  BP soft to restart beta-blocker.  HTN: BP on the higher side-starting Coreg today.   Minimally elevated troponin: Not consistent with ACS-cardiology does not recommend any further work-up.  Nondisplaced right fifth/sixth fractures: Continue with supportive care  Osteoarthritis bilateral  hip: Recent x-ray suggestive of possible right greater trochanter fracture-however CT pelvis on 12/21 without any fracture.  Continue supportive care  Suicidal intention: Expressed to OT on 12/20-no longer suicidal-Per psychiatry-this was likely in the setting of delirium.  Per psychiatry-low risk to self-harm based on the evaluation.  Does not require Recruitment consultant.  History of depression: On Lexapro at home-currently on hold-resume if okay with psychiatry.  Physical deconditioning: PT/OT following-recommendations are for SNF.      Condition - Guarded  Family Communication  : D/W daughter by phone 12/20  Code Status :  Full  Consults  :  None  PUD Prophylaxis : Pepcid  Disposition: Will need SNF placement once he finishes COVID quarantine, remains inpatient given fluctuating blood pressure, and now with suicidal ideations.    Procedures  :     TTE -   EEG - non acute  CT head and C-spine.  Nonacute.  CTA - Patent three-vessel runoff bilaterally. Additional ancillary findings as above  CT chest -  Nondisplaced right anterolateral 5th and 6th rib fractures. No pneumothorax is seen. Suspected mild aspiration in the right middle lobe and bilateral lung bases. Aortic Atherosclerosis (ICD10-I70.0).        Disposition Plan  : SNF  Status is: Inpatient  DVT Prophylaxis  :    enoxaparin (LOVENOX) injection 40 mg Start: 05/28/21 1600     Lab Results  Component Value Date   PLT 195 06/07/2021    Diet :  Diet Order             DIET SOFT Room service appropriate? Yes; Fluid consistency: Nectar Thick  Diet effective now                    Inpatient Medications  Scheduled Meds:  aspirin  81 mg Oral Daily   carvedilol  6.25 mg Oral BID WC   chlorhexidine  15 mL Mouth Rinse BID   enoxaparin (LOVENOX) injection  40 mg Subcutaneous Q24H   feeding supplement  237 mL Oral TID BM   folic acid  1 mg Oral Daily   mouth rinse  15 mL Mouth Rinse q12n4p   multivitamin with minerals  1 tablet Oral Daily   rosuvastatin  10 mg Oral Daily   thiamine  100 mg Oral Daily   Or   thiamine  100 mg Intravenous Daily   Continuous Infusions:  sodium chloride     PRN Meds:.sodium chloride, acetaminophen, labetalol, lip balm, loperamide  Antibiotics  :    Anti-infectives (From admission, onward)    Start     Dose/Rate Route Frequency Ordered Stop   05/29/21 1000  remdesivir 100 mg in sodium chloride 0.9 % 100 mL IVPB       See Hyperspace for full Linked Orders Report.   100 mg 200 mL/hr over 30 Minutes Intravenous Daily 05/28/21 0530 06/01/21 0944   05/29/21 0200  vancomycin (VANCOREADY) IVPB 2000 mg/400 mL  Status:  Discontinued        2,000 mg 200 mL/hr over 120 Minutes Intravenous Every 24 hours 05/28/21 0538 05/28/21 0942   05/28/21 1100  levofloxacin (LEVAQUIN) tablet 750 mg        750 mg Oral Daily 05/28/21 0943 05/31/21 1325   05/28/21 1100  metroNIDAZOLE (FLAGYL) tablet 500 mg        500 mg Oral Every 8 hours 05/28/21 0943 06/01/21 2359   05/28/21 0800  metroNIDAZOLE (FLAGYL) IVPB 500 mg  Status:  Discontinued  500 mg 100 mL/hr over 60 Minutes Intravenous Every 8 hours 05/28/21 0523 05/28/21 0942   05/28/21 0800  ceFEPIme (MAXIPIME) 2 g in sodium chloride 0.9 % 100 mL  IVPB  Status:  Discontinued        2 g 200 mL/hr over 30 Minutes Intravenous Every 8 hours 05/28/21 0538 05/28/21 0943   05/28/21 0530  remdesivir 200 mg in sodium chloride 0.9% 250 mL IVPB       See Hyperspace for full Linked Orders Report.   200 mg 580 mL/hr over 30 Minutes Intravenous Once 05/28/21 0530 05/28/21 0754   05/27/21 2330  ceFEPIme (MAXIPIME) 2 g in sodium chloride 0.9 % 100 mL IVPB        2 g 200 mL/hr over 30 Minutes Intravenous  Once 05/27/21 2315 05/28/21 0205   05/27/21 2330  metroNIDAZOLE (FLAGYL) IVPB 500 mg        500 mg 100 mL/hr over 60 Minutes Intravenous  Once 05/27/21 2315 05/28/21 0205   05/27/21 2330  vancomycin (VANCOREADY) IVPB 2000 mg/400 mL        2,000 mg 200 mL/hr over 120 Minutes Intravenous  Once 05/27/21 2315 05/28/21 0422          Fred Graham Fred Graham M.D on 06/08/2021 at 12:03 PM  To page go to [email or web address]   [facility] -  Office  [phone]  See all Orders from today for further details    Objective:   Vitals:   06/08/21 0345 06/08/21 0417 06/08/21 0728 06/08/21 0900  BP: (!) 177/92 (!) 162/101 (!) 165/111 (!) 162/96  Pulse: 76 74 83   Resp: 19  20   Temp: 97.9 F (36.6 C)  97.9 F (36.6 C)   TempSrc: Oral  Oral   SpO2: 96%  97%   Weight:      Height:        Wt Readings from Last 3 Encounters:  06/06/21 87.8 kg  05/05/19 93 kg  10/20/18 95.3 kg     Intake/Output Summary (Last 24 hours) at 06/08/2021 1203 Last data filed at 06/08/2021 0409 Gross per 24 hour  Intake 600 ml  Output 1600 ml  Net -1000 ml      Physical Exam Gen Exam:Alert awake-not in any distress HEENT:atraumatic, normocephalic Chest: B/L clear to auscultation anteriorly CVS:S1S2 regular Abdomen:soft non tender, non distended Extremities:no edema Neurology: Non focal Skin: no rash      Data Review:    CBC Recent Labs  Lab 06/02/21 0101 06/04/21 0604 06/07/21 0055  WBC 11.1* 11.0* 11.1*  HGB 14.6 16.5 13.6  HCT 42.0 47.0  38.9*  PLT 334 273 195  MCV 90.5 91.8 91.5  MCH 31.5 32.2 32.0  MCHC 34.8 35.1 35.0  RDW 13.1 13.2 13.4     Electrolytes Recent Labs  Lab 06/02/21 0101 06/03/21 0143 06/04/21 0604 06/07/21 0055 06/08/21 0044  NA 134* 132* 135 135 131*  K 3.1* 3.7 3.5 3.3* 3.4*  CL 99 99 100 103 99  CO2 24 25 26 23 23   GLUCOSE 119* 116* 89 96 98  BUN 31* 28* 27* 21 22  CREATININE 0.82 0.73 0.73 0.70 0.66  CALCIUM 8.5* 8.3* 8.5* 8.3* 8.3*  AST 46*  --   --   --   --   ALT 68*  --   --   --   --   ALKPHOS 41  --   --   --   --   BILITOT 0.6  --   --   --   --  ALBUMIN 2.9*  --   --   --   --   MG 2.1  --   --   --  2.3     ------------------------------------------------------------------------------------------------------------------ No results for input(s): CHOL, HDL, LDLCALC, TRIG, CHOLHDL, LDLDIRECT in the last 72 hours.  No results found for: HGBA1C  No results for input(s): TSH, T4TOTAL, T3FREE, THYROIDAB in the last 72 hours.  Invalid input(s): FREET3  ------------------------------------------------------------------------------------------------------------------ ID Labs Recent Labs  Lab 06/02/21 0101 06/03/21 0143 06/04/21 0604 06/07/21 0055 06/08/21 0044  WBC 11.1*  --  11.0* 11.1*  --   PLT 334  --  273 195  --   CREATININE 0.82 0.73 0.73 0.70 0.66    Cardiac Enzymes No results for input(s): CKMB, TROPONINI, MYOGLOBIN in the last 168 hours.  Invalid input(s): CK      Radiology Reports DG Knee 1-2 Views Right  Result Date: 06/05/2021 CLINICAL DATA:  Recent fall with right knee pain, initial EXAM: RIGHT KNEE - 2 VIEW COMPARISON:  None. FINDINGS: No acute fracture or dislocation is noted. No joint effusion is seen. Mild degenerative changes are noted in the patellofemoral joint. IMPRESSION: Degenerative change without acute abnormality. Electronically Signed   By: Alcide Clever M.D.   On: 06/05/2021 15:37   CT PELVIS WO CONTRAST  Result Date:  06/07/2021 CLINICAL DATA:  Possible right greater trochanteric fracture. EXAM: CT PELVIS WITHOUT CONTRAST TECHNIQUE: Multidetector CT imaging of the pelvis was performed following the standard protocol without intravenous contrast. COMPARISON:  None. FINDINGS: Urinary Tract:  Bladder is distended. Bowel: No bowel dilatation. Diverticular disease noted sigmoid colon without diverticulitis. Vascular/Lymphatic: No pathologically enlarged lymph nodes. No significant vascular abnormality seen. Reproductive:  No mass or other significant abnormality Other:  No intraperitoneal free fluid. Musculoskeletal: No evidence for an acute fracture in the bony pelvis or either proximal femur. Advanced degenerative changes are seen in the right hip with loss of joint space, subchondral sclerosis and subchondral cyst formation. More modest degenerative changes are noted in the left hip. SI joints and symphysis pubis unremarkable. IMPRESSION: No acute bony abnormality. Specifically, no greater trochanteric fracture involving the proximal right femur as suggested on recent x-ray. Bilateral degenerative hip disease, right greater than left. Electronically Signed   By: Kennith Center M.D.   On: 06/07/2021 12:18   MR BRAIN WO CONTRAST  Result Date: 06/05/2021 CLINICAL DATA:  Delirium; confusion. EXAM: MRI HEAD WITHOUT CONTRAST TECHNIQUE: Multiplanar, multiecho pulse sequences of the brain and surrounding structures were obtained without intravenous contrast. COMPARISON:  Head CT 05/28/2021. FINDINGS: Brain: Intermittently motion degraded examination, limiting evaluation. Most notably, there is mild-to-moderate motion degradation of the axial T2 FLAIR sequence. Mild generalized cerebral atrophy. Moderate multifocal T2 FLAIR hyperintense signal abnormality within the cerebral white matter, nonspecific but compatible with chronic small vessel ischemic disease. Subcentimeter focus of SWI signal loss within the posterior left frontal lobe,  which may reflect a chronic parenchymal microhemorrhage or cavernoma (series 12, image 44). There is no acute infarct. No evidence of an intracranial mass. No extra-axial fluid collection. No midline shift. Vascular: Maintained flow voids within the proximal large arterial vessels. Skull and upper cervical spine: No focal suspicious marrow lesion. Sinuses/Orbits: Visualized orbits show no acute finding. Moderate fluid level and background mild mucosal thickening within the left maxillary sinus. Small fluid level and background trace mucosal thickening within the right sphenoid sinus. Mild mucosal thickening within the left sphenoid sinus. Mild mucosal thickening within the left greater than right ethmoid sinuses. Trace mucosal  thickening within the left frontal sinus. IMPRESSION: Intermittently motion degraded examination, as described. No evidence of acute intracranial abnormality. Moderate chronic small vessel ischemic changes within the cerebral white matter. Subcentimeter focus of susceptibility-weighted signal loss within the posterior left frontal lobe, which may reflect a chronic parenchymal microhemorrhage or cavernoma. Mild generalized cerebral atrophy. Paranasal sinus disease, most notably left maxillary and right sphenoid. Correlate for acute sinusitis. Electronically Signed   By: Jackey Loge D.O.   On: 06/05/2021 12:21   DG HIP UNILAT WITH PELVIS 1V RIGHT  Result Date: 06/05/2021 CLINICAL DATA:  Recent fall with hip pain, initial encounter EXAM: DG HIP (WITH OR WITHOUT PELVIS) 3V RIGHT COMPARISON:  12/06/2013 FINDINGS: Degenerative changes of the right hip joint are seen. Lucency is noted along the greater trochanter consistent with an undisplaced greater trochanter fracture. No other fractures are seen. IMPRESSION: Lucency along the right greater trochanter consistent with undisplaced fracture. CT of the pelvis may be helpful to assess for more occult injury. Degenerative changes of the right hip  joint. Electronically Signed   By: Alcide Clever M.D.   On: 06/05/2021 15:37

## 2021-06-09 DIAGNOSIS — U071 COVID-19: Secondary | ICD-10-CM | POA: Diagnosis not present

## 2021-06-09 DIAGNOSIS — E871 Hypo-osmolality and hyponatremia: Secondary | ICD-10-CM | POA: Diagnosis not present

## 2021-06-09 DIAGNOSIS — A419 Sepsis, unspecified organism: Secondary | ICD-10-CM | POA: Diagnosis not present

## 2021-06-09 DIAGNOSIS — G9341 Metabolic encephalopathy: Secondary | ICD-10-CM | POA: Diagnosis not present

## 2021-06-09 LAB — CBC
HCT: 38.6 % — ABNORMAL LOW (ref 39.0–52.0)
Hemoglobin: 13.4 g/dL (ref 13.0–17.0)
MCH: 31.7 pg (ref 26.0–34.0)
MCHC: 34.7 g/dL (ref 30.0–36.0)
MCV: 91.3 fL (ref 80.0–100.0)
Platelets: 225 10*3/uL (ref 150–400)
RBC: 4.23 MIL/uL (ref 4.22–5.81)
RDW: 13.5 % (ref 11.5–15.5)
WBC: 9.8 10*3/uL (ref 4.0–10.5)
nRBC: 0 % (ref 0.0–0.2)

## 2021-06-09 LAB — MAGNESIUM: Magnesium: 2.3 mg/dL (ref 1.7–2.4)

## 2021-06-09 LAB — BASIC METABOLIC PANEL
Anion gap: 8 (ref 5–15)
BUN: 23 mg/dL (ref 8–23)
CO2: 24 mmol/L (ref 22–32)
Calcium: 8.7 mg/dL — ABNORMAL LOW (ref 8.9–10.3)
Chloride: 102 mmol/L (ref 98–111)
Creatinine, Ser: 0.73 mg/dL (ref 0.61–1.24)
GFR, Estimated: >60 mL/min (ref >60)
Glucose, Bld: 108 mg/dL — ABNORMAL HIGH (ref 70–99)
Potassium: 3.3 mmol/L — ABNORMAL LOW (ref 3.5–5.1)
Sodium: 134 mmol/L — ABNORMAL LOW (ref 135–145)

## 2021-06-09 MED ORDER — SODIUM CHLORIDE 0.9 % IV BOLUS
500.0000 mL | Freq: Once | INTRAVENOUS | Status: AC
  Administered 2021-06-09: 11:00:00 500 mL via INTRAVENOUS

## 2021-06-09 MED ORDER — SODIUM CHLORIDE 0.9 % IV SOLN: INTRAVENOUS | Status: AC

## 2021-06-09 MED ORDER — QUETIAPINE FUMARATE 25 MG PO TABS: 25.0000 mg | ORAL_TABLET | Freq: Every day | ORAL | Status: DC

## 2021-06-09 MED ORDER — QUETIAPINE FUMARATE 25 MG PO TABS
25.0000 mg | ORAL_TABLET | Freq: Every day | ORAL | Status: DC
  Administered 2021-06-09 – 2021-06-11 (×3): 25 mg via ORAL
  Filled 2021-06-09 (×3): qty 1

## 2021-06-09 MED ORDER — POTASSIUM CHLORIDE CRYS ER 20 MEQ PO TBCR
40.0000 meq | EXTENDED_RELEASE_TABLET | Freq: Once | ORAL | Status: AC
  Administered 2021-06-09: 15:00:00 40 meq via ORAL
  Filled 2021-06-09: qty 2

## 2021-06-09 MED ORDER — MIDODRINE HCL 5 MG PO TABS
5.0000 mg | ORAL_TABLET | Freq: Three times a day (TID) | ORAL | Status: DC
  Administered 2021-06-09: 15:00:00 5 mg via ORAL
  Filled 2021-06-09: qty 1

## 2021-06-09 NOTE — Progress Notes (Addendum)
PROGRESS NOTE                                                                                                                                                                                                             Patient Demographics:    Fred Graham, is a 70 y.o. male, DOB - 11-27-1950, MEQ:[id number]  Outpatient Primary MD for the patient is [name], NP    LOS - 12  Admit date - 05/27/2021    Chief Complaint  Patient presents with   Altered Mental Status       Brief Narrative  Fred Graham is a 70 y.o. male with medical history significant of hypertension, alcohol abuse, depression presented to the ED via EMS after being found down at home with confusion/hypothermia (welfare check by law enforcement as family had not heard from him).  Patient was found to have sepsis due to aspiration pneumonia/COVID-19 infection and subsequently admitted to the hospitalist service.  Further hospital course was complicated by alcohol withdrawal syndrome/DTs. See below for further details.    Subjective:   Continues to have some lingering/intermittent confusion-but this morning-relatively awake and alert-answering simple questions appropriately.   Assessment  & Plan :   Severe sepsis due to COVID-19 infection/aspiration pneumonia: Sepsis physiology has resolved-he was treated with broad-spectrum antimicrobial therapy/Remdesivir.  Has completed more than 10 days of isolation-does not require isolation at this point.  Staph epidermidis bacteremia: Thought to be a contaminant and not a real infection.  Repeat blood cultures on 12/13 are negative.  Hyponatremia: Suggestive of SIADH-treated with Samsca-sodium levels now stable.  Follow periodically.  Hypokalemia: Replete and recheck.  Rhabdomyolysis: Improved after IVF.  Acute metabolic encephalopathy: Overall slowly improving-continues to have some intermittent/lingering  encephalopathy/confusion.  Etiology felt to be due to sepsis/PNA and EtOH withdrawal symptoms.  MRI brain negative for acute abnormalities.  EEG without seizures.  Continue supportive care-will add low dose seroquel. QTc is much better   EtOH withdrawal/DTs: Mentation continues to improve-some lingering confusion at times-no longer on CIWA protocol-remains on thiamine.  Plan is to continue supportive care-maintain delirium precautions.   Chronic systolic heart failure: Euvolemic on exam-does not require diuretics.    HTN: Started on Coreg-however BP soft today-stop Coreg-we will gently hydrate for a few hours.  Minimally elevated troponin: Not consistent with ACS-cardiology does not recommend  any further work-up.  Nondisplaced right fifth/sixth fractures: Continue with supportive care  Osteoarthritis bilateral hip: Recent x-ray suggestive of possible right greater trochanter fracture-however CT pelvis on 12/21 without any fracture.  Continue supportive care  Suicidal intention: Expressed to OT on 12/20-no longer suicidal-Per psychiatry-this was likely in the setting of delirium.  Per psychiatry-low risk to self-harm based on the evaluation.  Does not require Recruitment consultant.  History of depression: Currently encephalopathic-resume Lexapro when able.  Physical deconditioning: PT/OT following-recommendations are for SNF.      Condition - Guarded  Family Communication  : D/W daughter by phone 12/20  Code Status :  Full  Consults  :  None  PUD Prophylaxis : Pepcid  Disposition: Will need SNF placement once he finishes COVID quarantine, remains inpatient given fluctuating blood pressure, and now with suicidal ideations.    Procedures  :     TTE -   EEG - non acute  CT head and C-spine.  Nonacute.  CTA - Patent three-vessel runoff bilaterally. Additional ancillary findings as above  CT chest -  Nondisplaced right anterolateral 5th and 6th rib fractures. No pneumothorax is seen.  Suspected mild aspiration in the right middle lobe and bilateral lung bases. Aortic Atherosclerosis (ICD10-I70.0).       Disposition Plan  : SNF  Status is: Inpatient  DVT Prophylaxis  :    enoxaparin (LOVENOX) injection 40 mg Start: 05/28/21 1600     Lab Results  Component Value Date   PLT 225 06/09/2021    Diet :  Diet Order             DIET SOFT Room service appropriate? Yes; Fluid consistency: Nectar Thick  Diet effective now                    Inpatient Medications  Scheduled Meds:  aspirin  81 mg Oral Daily   chlorhexidine  15 mL Mouth Rinse BID   enoxaparin (LOVENOX) injection  40 mg Subcutaneous Q24H   feeding supplement  237 mL Oral TID BM   folic acid  1 mg Oral Daily   mouth rinse  15 mL Mouth Rinse q12n4p   multivitamin with minerals  1 tablet Oral Daily   rosuvastatin  10 mg Oral Daily   thiamine  100 mg Oral Daily   Or   thiamine  100 mg Intravenous Daily   Continuous Infusions:  sodium chloride     PRN Meds:.sodium chloride, acetaminophen, labetalol, lip balm, loperamide  Antibiotics  :    Anti-infectives (From admission, onward)    Start     Dose/Rate Route Frequency Ordered Stop   05/29/21 1000  remdesivir 100 mg in sodium chloride 0.9 % 100 mL IVPB       See Hyperspace for full Linked Orders Report.   100 mg 200 mL/hr over 30 Minutes Intravenous Daily 05/28/21 0530 06/01/21 0944   05/29/21 0200  vancomycin (VANCOREADY) IVPB 2000 mg/400 mL  Status:  Discontinued        2,000 mg 200 mL/hr over 120 Minutes Intravenous Every 24 hours 05/28/21 0538 05/28/21 0942   05/28/21 1100  levofloxacin (LEVAQUIN) tablet 750 mg        750 mg Oral Daily 05/28/21 0943 05/31/21 1325   05/28/21 1100  metroNIDAZOLE (FLAGYL) tablet 500 mg        500 mg Oral Every 8 hours 05/28/21 0943 06/01/21 2359   05/28/21 0800  metroNIDAZOLE (FLAGYL) IVPB 500 mg  Status:  Discontinued  500 mg 100 mL/hr over 60 Minutes Intravenous Every 8 hours 05/28/21 0523  05/28/21 0942   05/28/21 0800  ceFEPIme (MAXIPIME) 2 g in sodium chloride 0.9 % 100 mL IVPB  Status:  Discontinued        2 g 200 mL/hr over 30 Minutes Intravenous Every 8 hours 05/28/21 0538 05/28/21 0943   05/28/21 0530  remdesivir 200 mg in sodium chloride 0.9% 250 mL IVPB       See Hyperspace for full Linked Orders Report.   200 mg 580 mL/hr over 30 Minutes Intravenous Once 05/28/21 0530 05/28/21 0754   05/27/21 2330  ceFEPIme (MAXIPIME) 2 g in sodium chloride 0.9 % 100 mL IVPB        2 g 200 mL/hr over 30 Minutes Intravenous  Once 05/27/21 2315 05/28/21 0205   05/27/21 2330  metroNIDAZOLE (FLAGYL) IVPB 500 mg        500 mg 100 mL/hr over 60 Minutes Intravenous  Once 05/27/21 2315 05/28/21 0205   05/27/21 2330  vancomycin (VANCOREADY) IVPB 2000 mg/400 mL        2,000 mg 200 mL/hr over 120 Minutes Intravenous  Once 05/27/21 2315 05/28/21 0422          Dewayne Shorter Perlie Stene M.D on 06/09/2021 at 11:53 AM  To page go to [email or web address]   [facility] -  Office  [phone]  See all Orders from today for further details    Objective:   Vitals:   06/08/21 2024 06/09/21 0114 06/09/21 0600 06/09/21 1002  BP: 113/80 (!) 140/99 138/88 (!) 87/62  Pulse: 90 83 83 89  Resp: 19 17 16 17   Temp: 98.5 F (36.9 C) 98.5 F (36.9 C) 98 F (36.7 C) 97.7 F (36.5 C)  TempSrc: Axillary Axillary Axillary Oral  SpO2: 93% (!) 83% 96% 98%  Weight:      Height:        Wt Readings from Last 3 Encounters:  06/06/21 87.8 kg  05/05/19 93 kg  10/20/18 95.3 kg     Intake/Output Summary (Last 24 hours) at 06/09/2021 1153 Last data filed at 06/09/2021 0352 Gross per 24 hour  Intake 440 ml  Output 925 ml  Net -485 ml      Physical Exam Gen Exam:Alert awake-not in any distress HEENT:atraumatic, normocephalic Chest: B/L clear to auscultation anteriorly CVS:S1S2 regular Abdomen:soft non tender, non distended Extremities:no edema Neurology: Non focal Skin: no rash      Data  Review:    CBC Recent Labs  Lab 06/04/21 0604 06/07/21 0055 06/09/21 0045  WBC 11.0* 11.1* 9.8  HGB 16.5 13.6 13.4  HCT 47.0 38.9* 38.6*  PLT 273 195 225  MCV 91.8 91.5 91.3  MCH 32.2 32.0 31.7  MCHC 35.1 35.0 34.7  RDW 13.2 13.4 13.5     Electrolytes Recent Labs  Lab 06/03/21 0143 06/04/21 0604 06/07/21 0055 06/08/21 0044 06/09/21 0045  NA 132* 135 135 131* 134*  K 3.7 3.5 3.3* 3.4* 3.3*  CL 99 100 103 99 102  CO2 25 26 23 23 24   GLUCOSE 116* 89 96 98 108*  BUN 28* 27* 21 22 23   CREATININE 0.73 0.73 0.70 0.66 0.73  CALCIUM 8.3* 8.5* 8.3* 8.3* 8.7*  MG  --   --   --  2.3 2.3     ------------------------------------------------------------------------------------------------------------------ No results for input(s): CHOL, HDL, LDLCALC, TRIG, CHOLHDL, LDLDIRECT in the last 72 hours.  No results found for: HGBA1C  No results for input(s): TSH, T4TOTAL, T3FREE, THYROIDAB  in the last 72 hours.  Invalid input(s): FREET3  ------------------------------------------------------------------------------------------------------------------ ID Labs Recent Labs  Lab 06/03/21 0143 06/04/21 0604 06/07/21 0055 06/08/21 0044 06/09/21 0045  WBC  --  11.0* 11.1*  --  9.8  PLT  --  273 195  --  225  CREATININE 0.73 0.73 0.70 0.66 0.73    Cardiac Enzymes No results for input(s): CKMB, TROPONINI, MYOGLOBIN in the last 168 hours.  Invalid input(s): CK      Radiology Reports DG Knee 1-2 Views Right  Result Date: 06/05/2021 CLINICAL DATA:  Recent fall with right knee pain, initial EXAM: RIGHT KNEE - 2 VIEW COMPARISON:  None. FINDINGS: No acute fracture or dislocation is noted. No joint effusion is seen. Mild degenerative changes are noted in the patellofemoral joint. IMPRESSION: Degenerative change without acute abnormality. Electronically Signed   By: Alcide Clever M.D.   On: 06/05/2021 15:37   CT PELVIS WO CONTRAST  Result Date: 06/07/2021 CLINICAL DATA:   Possible right greater trochanteric fracture. EXAM: CT PELVIS WITHOUT CONTRAST TECHNIQUE: Multidetector CT imaging of the pelvis was performed following the standard protocol without intravenous contrast. COMPARISON:  None. FINDINGS: Urinary Tract:  Bladder is distended. Bowel: No bowel dilatation. Diverticular disease noted sigmoid colon without diverticulitis. Vascular/Lymphatic: No pathologically enlarged lymph nodes. No significant vascular abnormality seen. Reproductive:  No mass or other significant abnormality Other:  No intraperitoneal free fluid. Musculoskeletal: No evidence for an acute fracture in the bony pelvis or either proximal femur. Advanced degenerative changes are seen in the right hip with loss of joint space, subchondral sclerosis and subchondral cyst formation. More modest degenerative changes are noted in the left hip. SI joints and symphysis pubis unremarkable. IMPRESSION: No acute bony abnormality. Specifically, no greater trochanteric fracture involving the proximal right femur as suggested on recent x-ray. Bilateral degenerative hip disease, right greater than left. Electronically Signed   By: Kennith Center M.D.   On: 06/07/2021 12:18   MR BRAIN WO CONTRAST  Result Date: 06/05/2021 CLINICAL DATA:  Delirium; confusion. EXAM: MRI HEAD WITHOUT CONTRAST TECHNIQUE: Multiplanar, multiecho pulse sequences of the brain and surrounding structures were obtained without intravenous contrast. COMPARISON:  Head CT 05/28/2021. FINDINGS: Brain: Intermittently motion degraded examination, limiting evaluation. Most notably, there is mild-to-moderate motion degradation of the axial T2 FLAIR sequence. Mild generalized cerebral atrophy. Moderate multifocal T2 FLAIR hyperintense signal abnormality within the cerebral white matter, nonspecific but compatible with chronic small vessel ischemic disease. Subcentimeter focus of SWI signal loss within the posterior left frontal lobe, which may reflect a chronic  parenchymal microhemorrhage or cavernoma (series 12, image 44). There is no acute infarct. No evidence of an intracranial mass. No extra-axial fluid collection. No midline shift. Vascular: Maintained flow voids within the proximal large arterial vessels. Skull and upper cervical spine: No focal suspicious marrow lesion. Sinuses/Orbits: Visualized orbits show no acute finding. Moderate fluid level and background mild mucosal thickening within the left maxillary sinus. Small fluid level and background trace mucosal thickening within the right sphenoid sinus. Mild mucosal thickening within the left sphenoid sinus. Mild mucosal thickening within the left greater than right ethmoid sinuses. Trace mucosal thickening within the left frontal sinus. IMPRESSION: Intermittently motion degraded examination, as described. No evidence of acute intracranial abnormality. Moderate chronic small vessel ischemic changes within the cerebral white matter. Subcentimeter focus of susceptibility-weighted signal loss within the posterior left frontal lobe, which may reflect a chronic parenchymal microhemorrhage or cavernoma. Mild generalized cerebral atrophy. Paranasal sinus disease, most notably left maxillary  and right sphenoid. Correlate for acute sinusitis. Electronically Signed   By: Jackey Loge D.O.   On: 06/05/2021 12:21   DG HIP UNILAT WITH PELVIS 1V RIGHT  Result Date: 06/05/2021 CLINICAL DATA:  Recent fall with hip pain, initial encounter EXAM: DG HIP (WITH OR WITHOUT PELVIS) 3V RIGHT COMPARISON:  12/06/2013 FINDINGS: Degenerative changes of the right hip joint are seen. Lucency is noted along the greater trochanter consistent with an undisplaced greater trochanter fracture. No other fractures are seen. IMPRESSION: Lucency along the right greater trochanter consistent with undisplaced fracture. CT of the pelvis may be helpful to assess for more occult injury. Degenerative changes of the right hip joint. Electronically Signed    By: Alcide Clever M.D.   On: 06/05/2021 15:37

## 2021-06-09 NOTE — Care Management Important Message (Signed)
Important Message  Patient Details  Name: Fred Graham MRN: [id number] Date of Birth: [birth year]   Medicare Important Message Given:  Yes     Glorianne Proctor Stefan Church 06/09/2021, 3:31 PM

## 2021-06-09 NOTE — TOC Progression Note (Addendum)
Transition of Care [facility]) - Progression Note    Patient Details  Name: CEDRIK HEINDL MRN: [id number] Date of Birth: [birth year]  [facility] CM/SW Contact  Mearl Latin, LCSW Phone Number: 06/09/2021, 5:06 PM  Clinical Narrative:    10am-Per [facility] they can accept patient tomorrow.  12pm-[facility] contacted CSW back and stated that patient's insurance is changing to [facility] 06/18/21 and they are not in network.  CSW updated patient's daughter and provided other SNF bed offers. She stated she will speak with family and call CSW back by 5pm. CSW answered questions about how she can get paid for taking care of patient at home (ltc policy or Medicaid PCS) and she stated he likely will not qualify for Medicaid. She reported that she has been preparing for possibility that patient may not improve from his current condition.   5pm-Patient's daughter returned call and stated that she would like to take patient home with home health as she feels he is more patient with her and she is in the field and knows how to help rehab patient. She has spoken with several family members who are going to assist her in his care. She is requesting a hospital bed, hoyer lift, rolling walker, and 3in1 bsc. She declined wheelchair for now. She asked if patient will keep catheter; CSW directed her to medical staff but provided contact info for [facility] if she needs to order condom catheters. She requests [facility] for transport home. She confirmed PCP. Her address is [address], [place], [place] [address]. She stated she has to clear out a room for patient but will be prepared to accept him on Monday. MD aware.    Expected Discharge Plan: Home w Home Health Services Barriers to Discharge: SNF Covid, Continued Medical Work up  Expected Discharge Plan and Services Expected Discharge Plan: Home w Home Health Services In-house Referral: Clinical Social Work   Post Acute Care Choice: Skilled Nursing Facility Living  arrangements for the past 2 months: Single Family Home                                       Social Determinants of Health (SDOH) Interventions    Readmission Risk Interventions No flowsheet data found.

## 2021-06-10 DIAGNOSIS — A419 Sepsis, unspecified organism: Secondary | ICD-10-CM | POA: Diagnosis not present

## 2021-06-10 DIAGNOSIS — U071 COVID-19: Secondary | ICD-10-CM | POA: Diagnosis not present

## 2021-06-10 DIAGNOSIS — E871 Hypo-osmolality and hyponatremia: Secondary | ICD-10-CM | POA: Diagnosis not present

## 2021-06-10 DIAGNOSIS — G9341 Metabolic encephalopathy: Secondary | ICD-10-CM | POA: Diagnosis not present

## 2021-06-10 LAB — CBC
HCT: 37.6 % — ABNORMAL LOW (ref 39.0–52.0)
Hemoglobin: 13 g/dL (ref 13.0–17.0)
MCH: 32.6 pg (ref 26.0–34.0)
MCHC: 34.6 g/dL (ref 30.0–36.0)
MCV: 94.2 fL (ref 80.0–100.0)
Platelets: 211 10*3/uL (ref 150–400)
RBC: 3.99 MIL/uL — ABNORMAL LOW (ref 4.22–5.81)
RDW: 14 % (ref 11.5–15.5)
WBC: 7.4 10*3/uL (ref 4.0–10.5)
nRBC: 0 % (ref 0.0–0.2)

## 2021-06-10 LAB — BASIC METABOLIC PANEL
Anion gap: 8 (ref 5–15)
BUN: 38 mg/dL — ABNORMAL HIGH (ref 8–23)
CO2: 25 mmol/L (ref 22–32)
Calcium: 8.8 mg/dL — ABNORMAL LOW (ref 8.9–10.3)
Chloride: 105 mmol/L (ref 98–111)
Creatinine, Ser: 0.84 mg/dL (ref 0.61–1.24)
GFR, Estimated: >60 mL/min (ref >60)
Glucose, Bld: 97 mg/dL (ref 70–99)
Potassium: 3.8 mmol/L (ref 3.5–5.1)
Sodium: 138 mmol/L (ref 135–145)

## 2021-06-10 LAB — MAGNESIUM: Magnesium: 2.4 mg/dL (ref 1.7–2.4)

## 2021-06-10 MED ORDER — THIAMINE HCL 100 MG/ML IJ SOLN
500.0000 mg | Freq: Every day | INTRAMUSCULAR | Status: AC
  Administered 2021-06-10 – 2021-06-12 (×3): 500 mg via INTRAVENOUS
  Filled 2021-06-10 (×3): qty 6

## 2021-06-10 MED ORDER — THIAMINE HCL 100 MG PO TABS: 100.0000 mg | ORAL_TABLET | Freq: Every day | ORAL | Status: DC

## 2021-06-10 NOTE — Plan of Care (Signed)
Pt a/O x1, confused, forgetful, and restless. ([place]). Otherwise VS stable. Needs assistance with feeding, but can hold and drink nectar fluids without assistance. Very weak, unable to turn on his own, needs encouragement and education about importance to turning, even if he does "not feel like to do so". Bed in low position, alarms are on, call bell in reach(calling/forgetful). Daughter called and updated.

## 2021-06-10 NOTE — Progress Notes (Signed)
PROGRESS NOTE                                                                                                                                                                                                             Patient Demographics:    Fred Graham, is a 70 y.o. male, DOB - 1951/05/04, HYI:[id number]  Outpatient Primary MD for the patient is [name], NP    LOS - 13  Admit date - 05/27/2021    Chief Complaint  Patient presents with   Altered Mental Status       Brief Narrative  CHANDAN FLY is a 70 y.o. male with medical history significant of hypertension, alcohol abuse, depression presented to the ED via EMS after being found down at home with confusion/hypothermia (welfare check by law enforcement as family had not heard from him).  Patient was found to have sepsis due to aspiration pneumonia/COVID-19 infection and subsequently admitted to the hospitalist service.  Further hospital course was complicated by alcohol withdrawal syndrome/DTs. See below for further details.    Subjective:   Appears comfortable-relatively awake/alert today-answering simple questions appropriately.  No major issues overnight-apart from intermittent lingering confusion.   Assessment  & Plan :   Severe sepsis due to COVID-19 infection/aspiration pneumonia: Sepsis physiology has resolved-he was treated with broad-spectrum antimicrobial therapy/Remdesivir.  Has completed more than 10 days of isolation-does not require isolation at this point.  Staph epidermidis bacteremia: Thought to be a contaminant and not a real infection.  Repeat blood cultures on 12/13 are negative.  Hyponatremia: Suggestive of SIADH-treated with Samsca-sodium levels now stable.  Follow periodically.  Hypokalemia: Replete and recheck.  Rhabdomyolysis: Improved after IVF.  Acute metabolic encephalopathy: Overall slowly improving-continues to have some  intermittent/lingering encephalopathy/confusion.  Etiology felt to be due to sepsis/PNA and EtOH withdrawal symptoms.  MRI brain negative for acute abnormalities.  EEG without seizures.  Continue supportive care-and low-dose Seroquel.  We will plan on 3 days of high-dose thiamine to see if this improves mental status.  EtOH withdrawal/DTs: Mentation continues to improve-some lingering confusion at times-no longer on CIWA protocol-remains on thiamine.  Plan is to continue supportive care-maintain delirium precautions.   Chronic systolic heart failure: Euvolemic on exam-does not require diuretics.    HTN: BP's better--watch closely-no longer on Coreg as blood pressure was soft yesterday.  Minimally elevated troponin: Not consistent with ACS-cardiology does not recommend any further work-up.  Nondisplaced right fifth/sixth fractures: Continue with supportive care  Osteoarthritis bilateral hip: Recent x-ray suggestive of possible right greater trochanter fracture-however CT pelvis on 12/21 without any fracture.  Continue supportive care  Suicidal intention: Expressed to OT on 12/20-no longer suicidal-Per psychiatry-this was likely in the setting of delirium.  Per psychiatry-low risk to self-harm based on the evaluation.  Does not require Recruitment consultant.  History of depression: Currently encephalopathic-resume Lexapro when able.  Physical deconditioning: PT/OT following-recommendations are for SNF.  However due to insurance issues-plans are now to go home with daughter this coming Monday with maximal home health services      Condition - Guarded  Family Communication  : D/W daughter-Sheila- [phone] updated on 12/24  Code Status :  Full  Consults  :  None  PUD Prophylaxis : Pepcid  Disposition: Will need SNF placement once he finishes COVID quarantine, remains inpatient given fluctuating blood pressure, and now with suicidal ideations.    Procedures  :     TTE -   EEG - non  acute  CT head and C-spine.  Nonacute.  CTA - Patent three-vessel runoff bilaterally. Additional ancillary findings as above  CT chest -  Nondisplaced right anterolateral 5th and 6th rib fractures. No pneumothorax is seen. Suspected mild aspiration in the right middle lobe and bilateral lung bases. Aortic Atherosclerosis (ICD10-I70.0).       Disposition Plan  : SNF  Status is: Inpatient  DVT Prophylaxis  :    enoxaparin (LOVENOX) injection 40 mg Start: 05/28/21 1600     Lab Results  Component Value Date   PLT 211 06/10/2021    Diet :  Diet Order             DIET SOFT Room service appropriate? Yes; Fluid consistency: Nectar Thick  Diet effective now                    Inpatient Medications  Scheduled Meds:  aspirin  81 mg Oral Daily   chlorhexidine  15 mL Mouth Rinse BID   enoxaparin (LOVENOX) injection  40 mg Subcutaneous Q24H   feeding supplement  237 mL Oral TID BM   folic acid  1 mg Oral Daily   mouth rinse  15 mL Mouth Rinse q12n4p   multivitamin with minerals  1 tablet Oral Daily   QUEtiapine  25 mg Oral QHS   rosuvastatin  10 mg Oral Daily   thiamine  500 mg Intravenous Daily   [START ON 06/13/2021] thiamine  100 mg Oral Daily   Continuous Infusions:  sodium chloride     PRN Meds:.sodium chloride, acetaminophen, labetalol, lip balm, loperamide  Antibiotics  :    Anti-infectives (From admission, onward)    Start     Dose/Rate Route Frequency Ordered Stop   05/29/21 1000  remdesivir 100 mg in sodium chloride 0.9 % 100 mL IVPB       See Hyperspace for full Linked Orders Report.   100 mg 200 mL/hr over 30 Minutes Intravenous Daily 05/28/21 0530 06/01/21 0944   05/29/21 0200  vancomycin (VANCOREADY) IVPB 2000 mg/400 mL  Status:  Discontinued        2,000 mg 200 mL/hr over 120 Minutes Intravenous Every 24 hours 05/28/21 0538 05/28/21 0942   05/28/21 1100  levofloxacin (LEVAQUIN) tablet 750 mg        750 mg Oral Daily 05/28/21 0943 05/31/21 1325  05/28/21 1100  metroNIDAZOLE (FLAGYL) tablet 500 mg        500 mg Oral Every 8 hours 05/28/21 0943 06/01/21 2359   05/28/21 0800  metroNIDAZOLE (FLAGYL) IVPB 500 mg  Status:  Discontinued        500 mg 100 mL/hr over 60 Minutes Intravenous Every 8 hours 05/28/21 0523 05/28/21 0942   05/28/21 0800  ceFEPIme (MAXIPIME) 2 g in sodium chloride 0.9 % 100 mL IVPB  Status:  Discontinued        2 g 200 mL/hr over 30 Minutes Intravenous Every 8 hours 05/28/21 0538 05/28/21 0943   05/28/21 0530  remdesivir 200 mg in sodium chloride 0.9% 250 mL IVPB       See Hyperspace for full Linked Orders Report.   200 mg 580 mL/hr over 30 Minutes Intravenous Once 05/28/21 0530 05/28/21 0754   05/27/21 2330  ceFEPIme (MAXIPIME) 2 g in sodium chloride 0.9 % 100 mL IVPB        2 g 200 mL/hr over 30 Minutes Intravenous  Once 05/27/21 2315 05/28/21 0205   05/27/21 2330  metroNIDAZOLE (FLAGYL) IVPB 500 mg        500 mg 100 mL/hr over 60 Minutes Intravenous  Once 05/27/21 2315 05/28/21 0205   05/27/21 2330  vancomycin (VANCOREADY) IVPB 2000 mg/400 mL        2,000 mg 200 mL/hr over 120 Minutes Intravenous  Once 05/27/21 2315 05/28/21 0422          Dewayne Shorter Jamesyn Lindell M.D on 06/10/2021 at 12:08 PM  To page go to [email or web address]   [facility] -  Office  [phone]  See all Orders from today for further details    Objective:   Vitals:   06/10/21 0028 06/10/21 0409 06/10/21 0754 06/10/21 1206  BP: 120/78 129/90 (!) 120/59 120/72  Pulse: 85 85 72 86  Resp: 19 18 18 20   Temp: 97.7 F (36.5 C) 98.1 F (36.7 C) 98.1 F (36.7 C) 98.4 F (36.9 C)  TempSrc: Axillary Axillary Axillary Axillary  SpO2: 96% 98%    Weight:      Height:        Wt Readings from Last 3 Encounters:  06/06/21 87.8 kg  05/05/19 93 kg  10/20/18 95.3 kg     Intake/Output Summary (Last 24 hours) at 06/10/2021 1208 Last data filed at 06/09/2021 2036 Gross per 24 hour  Intake 80 ml  Output 500 ml  Net -420 ml       Physical Exam Gen Exam:Alert awake-not in any distress HEENT:atraumatic, normocephalic Chest: B/L clear to auscultation anteriorly CVS:S1S2 regular Abdomen:soft non tender, non distended Extremities:no edema Neurology: Non focal Skin: no rash      Data Review:    CBC Recent Labs  Lab 06/04/21 0604 06/07/21 0055 06/09/21 0045 06/10/21 0156  WBC 11.0* 11.1* 9.8 7.4  HGB 16.5 13.6 13.4 13.0  HCT 47.0 38.9* 38.6* 37.6*  PLT 273 195 225 211  MCV 91.8 91.5 91.3 94.2  MCH 32.2 32.0 31.7 32.6  MCHC 35.1 35.0 34.7 34.6  RDW 13.2 13.4 13.5 14.0     Electrolytes Recent Labs  Lab 06/04/21 0604 06/07/21 0055 06/08/21 0044 06/09/21 0045 06/10/21 0156  NA 135 135 131* 134* 138  K 3.5 3.3* 3.4* 3.3* 3.8  CL 100 103 99 102 105  CO2 26 23 23 24 25   GLUCOSE 89 96 98 108* 97  BUN 27* 21 22 23  38*  CREATININE 0.73 0.70 0.66 0.73 0.84  CALCIUM  8.5* 8.3* 8.3* 8.7* 8.8*  MG  --   --  2.3 2.3 2.4     ------------------------------------------------------------------------------------------------------------------ No results for input(s): CHOL, HDL, LDLCALC, TRIG, CHOLHDL, LDLDIRECT in the last 72 hours.  No results found for: HGBA1C  No results for input(s): TSH, T4TOTAL, T3FREE, THYROIDAB in the last 72 hours.  Invalid input(s): FREET3  ------------------------------------------------------------------------------------------------------------------ ID Labs Recent Labs  Lab 06/04/21 0604 06/07/21 0055 06/08/21 0044 06/09/21 0045 06/10/21 0156  WBC 11.0* 11.1*  --  9.8 7.4  PLT 273 195  --  225 211  CREATININE 0.73 0.70 0.66 0.73 0.84    Cardiac Enzymes No results for input(s): CKMB, TROPONINI, MYOGLOBIN in the last 168 hours.  Invalid input(s): CK      Radiology Reports CT PELVIS WO CONTRAST  Result Date: 06/07/2021 CLINICAL DATA:  Possible right greater trochanteric fracture. EXAM: CT PELVIS WITHOUT CONTRAST TECHNIQUE: Multidetector CT imaging  of the pelvis was performed following the standard protocol without intravenous contrast. COMPARISON:  None. FINDINGS: Urinary Tract:  Bladder is distended. Bowel: No bowel dilatation. Diverticular disease noted sigmoid colon without diverticulitis. Vascular/Lymphatic: No pathologically enlarged lymph nodes. No significant vascular abnormality seen. Reproductive:  No mass or other significant abnormality Other:  No intraperitoneal free fluid. Musculoskeletal: No evidence for an acute fracture in the bony pelvis or either proximal femur. Advanced degenerative changes are seen in the right hip with loss of joint space, subchondral sclerosis and subchondral cyst formation. More modest degenerative changes are noted in the left hip. SI joints and symphysis pubis unremarkable. IMPRESSION: No acute bony abnormality. Specifically, no greater trochanteric fracture involving the proximal right femur as suggested on recent x-ray. Bilateral degenerative hip disease, right greater than left. Electronically Signed   By: Kennith Center M.D.   On: 06/07/2021 12:18

## 2021-06-11 DIAGNOSIS — A419 Sepsis, unspecified organism: Secondary | ICD-10-CM | POA: Diagnosis not present

## 2021-06-11 DIAGNOSIS — U071 COVID-19: Secondary | ICD-10-CM | POA: Diagnosis not present

## 2021-06-11 DIAGNOSIS — G9341 Metabolic encephalopathy: Secondary | ICD-10-CM | POA: Diagnosis not present

## 2021-06-11 DIAGNOSIS — E871 Hypo-osmolality and hyponatremia: Secondary | ICD-10-CM | POA: Diagnosis not present

## 2021-06-11 LAB — RPR: RPR Ser Ql: NONREACTIVE

## 2021-06-11 LAB — AMMONIA: Ammonia: 29 umol/L (ref 9–35)

## 2021-06-11 NOTE — Progress Notes (Signed)
PROGRESS NOTE                                                                                                                                                                                                             Patient Demographics:    Fred Graham, is a 70 y.o. male, DOB - 31-Dec-1950, ZOX:[id number]  Outpatient Primary MD for the patient is [name], NP    LOS - 14  Admit date - 05/27/2021    Chief Complaint  Patient presents with   Altered Mental Status       Brief Narrative  Fred Graham is a 70 y.o. male with medical history significant of hypertension, alcohol abuse, depression presented to the ED via EMS after being found down at home with confusion/hypothermia (welfare check by law enforcement as family had not heard from him).  Patient was found to have sepsis due to aspiration pneumonia/COVID-19 infection and subsequently admitted to the hospitalist service.  Further hospital course was complicated by alcohol withdrawal syndrome/DTs. See below for further details.    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  Able to tell me his daughter's name correctly as well.   Assessment  & Plan :   Severe sepsis due to COVID-19 infection/aspiration pneumonia: Sepsis physiology has resolved-he was treated with broad-spectrum antimicrobial therapy/Remdesivir.  Has completed more than 10 days of isolation-does not require isolation at this point.  Staph epidermidis bacteremia: Thought to be a contaminant and not a real infection.  Repeat blood cultures on 12/13 are negative.  Hyponatremia: Suggestive of SIADH-treated with Samsca-sodium levels now stable.  Follow periodically.  Hypokalemia: Replete and recheck.  Rhabdomyolysis: Improved after  IVF.  Acute metabolic encephalopathy: Overall slowly improving-continues to have some intermittent/lingering encephalopathy/confusion.  Etiology felt to be due to sepsis/PNA and EtOH withdrawal symptoms.  MRI brain negative for acute abnormalities.  EEG without seizures.  Mental status gradually improving continue IV thiamine x3 days, Seroquel-and other supportive care.  Maintain delirium precautions.  EtOH withdrawal/DTs: Mentation continues to improve-some lingering confusion at times-no longer on CIWA protocol-remains on thiamine.  Plan is to continue supportive care-maintain delirium precautions.   Chronic systolic heart failure: Euvolemic on  exam-does not require diuretics.    HTN: BP's better--watch closely-no longer on Coreg as blood pressure dropped with initiation of Coreg.  Minimally elevated troponin: Not consistent with ACS-cardiology does not recommend any further work-up.  Nondisplaced right fifth/sixth fractures: Continue with supportive care  Osteoarthritis bilateral hip: Recent x-ray suggestive of possible right greater trochanter fracture-however CT pelvis on 12/21 without any fracture.  Continue supportive care  Suicidal intention: Expressed to OT on 12/20-no longer suicidal-Per psychiatry-this was likely in the setting of delirium.  Per psychiatry-low risk to self-harm based on the evaluation.  Does not require Recruitment consultant.  History of depression: Currently encephalopathic-resume Lexapro when able.  Physical deconditioning: PT/OT following-recommendations are for SNF.  However due to insurance issues-plans are now to go home with daughter this coming Monday with maximal home health services      Condition - Guarded  Family Communication  : D/W daughter-Sheila- [phone] updated on 12/24  Code Status :  Full  Consults  :  None  PUD Prophylaxis : Pepcid  Disposition: Initially plans were for SNF-but family now wants to take him home   Procedures  :     TTE  -   EEG - non acute  CT head and C-spine.  Nonacute.  CTA - Patent three-vessel runoff bilaterally. Additional ancillary findings as above  CT chest -  Nondisplaced right anterolateral 5th and 6th rib fractures. No pneumothorax is seen. Suspected mild aspiration in the right middle lobe and bilateral lung bases. Aortic Atherosclerosis (ICD10-I70.0).       Disposition Plan  : SNF  Status is: Inpatient  DVT Prophylaxis  :    enoxaparin (LOVENOX) injection 40 mg Start: 05/28/21 1600     Lab Results  Component Value Date   PLT 211 06/10/2021    Diet :  Diet Order             DIET SOFT Room service appropriate? Yes; Fluid consistency: Nectar Thick  Diet effective now                    Inpatient Medications  Scheduled Meds:  aspirin  81 mg Oral Daily   chlorhexidine  15 mL Mouth Rinse BID   enoxaparin (LOVENOX) injection  40 mg Subcutaneous Q24H   feeding supplement  237 mL Oral TID BM   folic acid  1 mg Oral Daily   mouth rinse  15 mL Mouth Rinse q12n4p   multivitamin with minerals  1 tablet Oral Daily   QUEtiapine  25 mg Oral QHS   rosuvastatin  10 mg Oral Daily   thiamine  500 mg Intravenous Daily   [START ON 06/13/2021] thiamine  100 mg Oral Daily   Continuous Infusions:  sodium chloride     PRN Meds:.sodium chloride, acetaminophen, labetalol, lip balm, loperamide  Antibiotics  :    Anti-infectives (From admission, onward)    Start     Dose/Rate Route Frequency Ordered Stop   05/29/21 1000  remdesivir 100 mg in sodium chloride 0.9 % 100 mL IVPB       See Hyperspace for full Linked Orders Report.   100 mg 200 mL/hr over 30 Minutes Intravenous Daily 05/28/21 0530 06/01/21 0944   05/29/21 0200  vancomycin (VANCOREADY) IVPB 2000 mg/400 mL  Status:  Discontinued        2,000 mg 200 mL/hr over 120 Minutes Intravenous Every 24 hours 05/28/21 0538 05/28/21 0942   05/28/21 1100  levofloxacin (LEVAQUIN) tablet 750 mg  750 mg Oral Daily 05/28/21 0943  05/31/21 1325   05/28/21 1100  metroNIDAZOLE (FLAGYL) tablet 500 mg        500 mg Oral Every 8 hours 05/28/21 0943 06/01/21 2359   05/28/21 0800  metroNIDAZOLE (FLAGYL) IVPB 500 mg  Status:  Discontinued        500 mg 100 mL/hr over 60 Minutes Intravenous Every 8 hours 05/28/21 0523 05/28/21 0942   05/28/21 0800  ceFEPIme (MAXIPIME) 2 g in sodium chloride 0.9 % 100 mL IVPB  Status:  Discontinued        2 g 200 mL/hr over 30 Minutes Intravenous Every 8 hours 05/28/21 0538 05/28/21 0943   05/28/21 0530  remdesivir 200 mg in sodium chloride 0.9% 250 mL IVPB       See Hyperspace for full Linked Orders Report.   200 mg 580 mL/hr over 30 Minutes Intravenous Once 05/28/21 0530 05/28/21 0754   05/27/21 2330  ceFEPIme (MAXIPIME) 2 g in sodium chloride 0.9 % 100 mL IVPB        2 g 200 mL/hr over 30 Minutes Intravenous  Once 05/27/21 2315 05/28/21 0205   05/27/21 2330  metroNIDAZOLE (FLAGYL) IVPB 500 mg        500 mg 100 mL/hr over 60 Minutes Intravenous  Once 05/27/21 2315 05/28/21 0205   05/27/21 2330  vancomycin (VANCOREADY) IVPB 2000 mg/400 mL        2,000 mg 200 mL/hr over 120 Minutes Intravenous  Once 05/27/21 2315 05/28/21 0422          Dewayne Shorter Layali Freund M.D on 06/11/2021 at 1:10 PM  To page go to [email or web address]   [facility] -  Office  [phone]  See all Orders from today for further details    Objective:   Vitals:   06/11/21 0019 06/11/21 0418 06/11/21 0734 06/11/21 1203  BP: 133/85 (!) 110/94 117/77 136/83  Pulse: 79 90 87 90  Resp: 18 20 20    Temp:   98.1 F (36.7 C) 98 F (36.7 C)  TempSrc: Oral Oral Oral Oral  SpO2:  (!) 82%  100%  Weight:      Height:        Wt Readings from Last 3 Encounters:  06/06/21 87.8 kg  05/05/19 93 kg  10/20/18 95.3 kg     Intake/Output Summary (Last 24 hours) at 06/11/2021 1310 Last data filed at 06/11/2021 0419 Gross per 24 hour  Intake 240 ml  Output 550 ml  Net -310 ml      Physical Exam Gen Exam:Alert  awake-not in any distress HEENT:atraumatic, normocephalic Chest: B/L clear to auscultation anteriorly CVS:S1S2 regular Abdomen:soft non tender, non distended Extremities:no edema Neurology: Non focal Skin: no rash      Data Review:    CBC Recent Labs  Lab 06/07/21 0055 06/09/21 0045 06/10/21 0156  WBC 11.1* 9.8 7.4  HGB 13.6 13.4 13.0  HCT 38.9* 38.6* 37.6*  PLT 195 225 211  MCV 91.5 91.3 94.2  MCH 32.0 31.7 32.6  MCHC 35.0 34.7 34.6  RDW 13.4 13.5 14.0     Electrolytes Recent Labs  Lab 06/07/21 0055 06/08/21 0044 06/09/21 0045 06/10/21 0156 06/11/21 0903  NA 135 131* 134* 138  --   K 3.3* 3.4* 3.3* 3.8  --   CL 103 99 102 105  --   CO2 23 23 24 25   --   GLUCOSE 96 98 108* 97  --   BUN 21 22 23  38*  --  CREATININE 0.70 0.66 0.73 0.84  --   CALCIUM 8.3* 8.3* 8.7* 8.8*  --   MG  --  2.3 2.3 2.4  --   AMMONIA  --   --   --   --  29     ------------------------------------------------------------------------------------------------------------------ No results for input(s): CHOL, HDL, LDLCALC, TRIG, CHOLHDL, LDLDIRECT in the last 72 hours.  No results found for: HGBA1C  No results for input(s): TSH, T4TOTAL, T3FREE, THYROIDAB in the last 72 hours.  Invalid input(s): FREET3  ------------------------------------------------------------------------------------------------------------------ ID Labs Recent Labs  Lab 06/07/21 0055 06/08/21 0044 06/09/21 0045 06/10/21 0156  WBC 11.1*  --  9.8 7.4  PLT 195  --  225 211  CREATININE 0.70 0.66 0.73 0.84    Cardiac Enzymes No results for input(s): CKMB, TROPONINI, MYOGLOBIN in the last 168 hours.  Invalid input(s): CK      Radiology Reports No results found.

## 2021-06-12 DIAGNOSIS — E871 Hypo-osmolality and hyponatremia: Secondary | ICD-10-CM | POA: Diagnosis not present

## 2021-06-12 DIAGNOSIS — N179 Acute kidney failure, unspecified: Secondary | ICD-10-CM | POA: Diagnosis not present

## 2021-06-12 DIAGNOSIS — U071 COVID-19: Secondary | ICD-10-CM | POA: Diagnosis not present

## 2021-06-12 DIAGNOSIS — G9341 Metabolic encephalopathy: Secondary | ICD-10-CM | POA: Diagnosis not present

## 2021-06-12 MED ORDER — FOLIC ACID 1 MG PO TABS: 1.0000 mg | ORAL_TABLET | Freq: Every day | ORAL | 3 refills | Status: AC

## 2021-06-12 MED ORDER — ASPIRIN 81 MG PO CHEW: 81.0000 mg | CHEWABLE_TABLET | Freq: Every day | ORAL | 2 refills | Status: AC

## 2021-06-12 MED ORDER — ROSUVASTATIN CALCIUM 10 MG PO TABS: 10.0000 mg | ORAL_TABLET | Freq: Every day | ORAL | 3 refills | Status: AC

## 2021-06-12 MED ORDER — THIAMINE HCL 100 MG PO TABS: 100.0000 mg | ORAL_TABLET | Freq: Every day | ORAL | 3 refills | Status: AC

## 2021-06-12 MED ORDER — ENSURE ENLIVE PO LIQD: 237.0000 mL | Freq: Three times a day (TID) | ORAL | 0 refills | Status: AC

## 2021-06-12 NOTE — Plan of Care (Signed)
  Problem: Clinical Measurements: Goal: Will remain free from infection Outcome: Progressing Goal: Respiratory complications will improve Outcome: Progressing   Problem: Nutrition: Goal: Adequate nutrition will be maintained Outcome: Progressing   Problem: Coping: Goal: Level of anxiety will decrease Outcome: Progressing   

## 2021-06-12 NOTE — Progress Notes (Signed)
Seen and examined-following simple questions-answering simple questions appropriately-mental status continues to slowly improve-still has some lingering confusion.  Told me he wants to go home and have a cold glass of beer.    Daughter wants to take him home with maximal home health services per [facility] note-I did speak with the daughter a few days back as well.  Patient is stable for discharge with close outpatient follow-up with his PCP.  See discharge summary for details

## 2021-06-12 NOTE — Progress Notes (Signed)
Daughter called and updated about [facility] transfer the pt to the house.. AVS printed and with the patient. Education provided to the daughter. All questions are answered. Pt is cleaned and dry, transferred off the unit.

## 2021-06-12 NOTE — Care Management Important Message (Signed)
Important Message  Patient Details  Name: Fred Graham MRN: [id number] Date of Birth: 03/09/1951   Medicare Important Message Given:  Yes     Sherilyn Banker 06/12/2021, 1:06 PM

## 2021-06-12 NOTE — TOC Transition Note (Signed)
Transition of Care [facility]) - CM/SW Discharge Note   Patient Details  Name: Fred Graham MRN: [id number] Date of Birth: [birth year]  [facility] CM/SW Contact:  Harriet Masson, RN Phone Number: 06/12/2021, 10:03 AM   Clinical Narrative:    Spoke to daughter, [name], regarding transitions needs. [name] is agreeable to take patient home with her. [name] is agreeable to use in house provider for DME needs. Spoke to [place] with [employer] and hospital bed, 3&1, walker and houyer lift ordered. HH-PT/OT/aid/SW ordered. [name] deferred to CM to find [facility] agency. Spoke to [place] with [place] [facility] and referral accepted.  Once equipment is delivered [facility] will call [facility] for transportation home Daughter's address and phone number verified.   Final next level of care: Home w Home Health Services Barriers to Discharge: Barriers Resolved   Patient Goals and CMS Choice Patient states their goals for this hospitalization and ongoing recovery are:: go to daughters house CMS Medicare.gov Compare Post Acute Care list provided to:: Patient Represenative (must comment) Choice offered to / list presented to : Adult Children  Discharge Placement               Daughter's home        Discharge Plan and Services In-house Referral: Clinical Social Work Discharge Planning Services: CM Consult Post Acute Care Choice: Home Health, Durable Medical Equipment          DME Arranged: 3-N-1, Hospital bed, Dan Humphreys, Other see comment (hoyer lift) DME Agency: [employer] Date DME Agency Contacted: 06/12/21 Time DME Agency Contacted: [phone] Representative spoke with at DME Agency: Elease Hashimoto HH Arranged: PT, OT, Social Work, Nurse's Aide HH Agency: [facility] Date [facility] Agency Contacted: 06/12/21 Time HH Agency Contacted: [phone] Representative spoke with at [facility] Agency: Kandee Keen  Social Determinants of Health (SDOH) Interventions     Readmission Risk Interventions No flowsheet data found.

## 2021-06-12 NOTE — Discharge Summary (Signed)
PATIENT DETAILS Name: Fred Graham Age: 70 y.o. Sex: male Date of Birth: 03/24/1951 MRN: [id number]. Admitting Physician: Shela Leff, MD [id number], Richard, NP  Admit Date: 05/27/2021 Discharge date: 06/12/2021  Recommendations for Outpatient Follow-up:  Follow up with PCP in 1-2 weeks Please obtain CMP/CBC in one week Consider outpatient follow-up with neurology  Admitted From:  Home  Disposition: Home with home health services   Home Health: Yes  Equipment/Devices: None  Discharge Condition: Stable  CODE STATUS: FULL CODE  Diet recommendation:  Diet Order             Diet - low sodium heart healthy           DIET SOFT Room service appropriate? Yes; Fluid consistency: Nectar Thick  Diet effective now                    Brief Summary: Fred Graham is a 70 y.o. male with medical history significant of hypertension, alcohol abuse, depression presented to the ED via EMS after being found down at home with confusion/hypothermia (welfare check by law enforcement as family had not heard from him).  Patient was found to have sepsis due to aspiration pneumonia/COVID-19 infection and subsequently admitted to the hospitalist service.  Further hospital course was complicated by alcohol withdrawal syndrome/DTs. See below for further details  Brief Hospital Course: Severe sepsis due to COVID-19 infection/aspiration pneumonia: Sepsis physiology has resolved-he was treated with broad-spectrum antimicrobial therapy/Remdesivir.  Has completed more than 10 days of isolation-does not require isolation at this point.   Staph epidermidis bacteremia: Thought to be a contaminant and not a real infection.  Repeat blood cultures on 12/13 are negative.   Hyponatremia: Suggestive of SIADH-treated with Samsca-sodium levels now stable.  Follow periodically.   Hypokalemia: Repleted-recheck periodically at PCPs office.   Rhabdomyolysis: Improved after IVF.   Acute metabolic  encephalopathy: Mental status is improved over the past several days-still with some intermittent lingering confusion.  He is able to follow simple commands and answer simple questions appropriately.  Etiology of encephalopathy felt to be from sepsis/PNA-and EtOH withdrawal symptoms.  MRI brain negative for acute abnormalities.  EEG without seizures.  Vitamin B12/ammonia/RPR were all stable.  Continue supportive care-hoping that once he is back to his family surroundings at home-he may improve further.  If he still continues to have lingering confusion-May be beneficial for outpatient neurology follow-up.  EtOH withdrawal/DTs: Mentation continues to improve-some lingering confusion at times-no longer on CIWA protocol-remains on thiamine.  Treated with CIWA protocol.  Delirium precautions were maintained.  He has been counseled extensively regarding avoiding further alcohol use.   Chronic systolic heart failure: Euvolemic on exam-does not require diuretics.     HTN: BP's better--watch closely-no longer on Coreg as blood pressure dropped with initiation of Coreg.  PCP to reassess at follow-up.   Minimally elevated troponin: Not consistent with ACS-cardiology does not recommend any further work-up.   Nondisplaced right fifth/sixth fractures: Continue with supportive care   Osteoarthritis bilateral hip: Recent x-ray suggestive of possible right greater trochanter fracture-however CT pelvis on 12/21 without any fracture.  Continue supportive care   Suicidal intention: Expressed to OT on 12/20-no longer suicidal-Per psychiatry-this was likely in the setting of delirium.  Per psychiatry-low risk to self-harm based on the evaluation.  Does not require Air cabin crew.   History of depression: Currently encephalopathic-resume Lexapro when able.   Physical deconditioning: PT/OT following-recommendations are for SNF.  However due to insurance issues-plans are  now to go home with daughter this coming Monday with  maximal home health services  BMI: Estimated body mass index is 24.85 kg/m as calculated from the following:   Height as of this encounter: 6\' 2"  (1.88 m).   Weight as of this encounter: 87.8 kg.   RN pressure injury documentation: Pressure Injury 05/30/21 Sacrum Mid;Right;Left Stage 2 -  Partial thickness loss of dermis presenting as a shallow open injury with a red, pink wound bed without slough. pink open 63mm X 62mm area (Active)  05/30/21 0550  Location: Sacrum  Location Orientation: Mid;Right;Left  Staging: Stage 2 -  Partial thickness loss of dermis presenting as a shallow open injury with a red, pink wound bed without slough.  Wound Description (Comments): pink open 47mm X 70mm area  Present on Admission: No    Procedures None  Discharge Diagnoses:  Principal Problem:   Acute metabolic encephalopathy Active Problems:   Severe sepsis ([place])   COVID-19 virus infection   AKI (acute kidney injury) ([place])   Hyponatremia   Pressure injury of skin   Discharge Instructions:  Activity:  As tolerated with Full fall precautions use walker/cane & assistance as needed  Discharge Instructions     Ambulatory referral to Neurology   Complete by: As directed    An appointment is requested in approximately: 4 weeks   Diet - low sodium heart healthy   Complete by: As directed    Discharge instructions   Complete by: As directed    Follow with Primary MD  Maximiano Coss, NP in 1-2 weeks  Follow-up with neurology-their office should be giving you a call in the next few weeks.  If you do not hear from them-please give them a call.  Please get a complete blood count and chemistry panel checked by your Primary MD at your next visit, and again as instructed by your Primary MD.  Get Medicines reviewed and adjusted: Please take all your medications with you for your next visit with your Primary MD  Laboratory/radiological data: Please request your Primary MD to go over all hospital  tests and procedure/radiological results at the follow up, please ask your Primary MD to get all Hospital records sent to his/her office.  In some cases, they will be blood work, cultures and biopsy results pending at the time of your discharge. Please request that your primary care M.D. follows up on these results.  Also Note the following: If you experience worsening of your admission symptoms, develop shortness of breath, life threatening emergency, suicidal or homicidal thoughts you must seek medical attention immediately by calling 911 or calling your MD immediately  if symptoms less severe.  You must read complete instructions/literature along with all the possible adverse reactions/side effects for all the Medicines you take and that have been prescribed to you. Take any new Medicines after you have completely understood and accpet all the possible adverse reactions/side effects.   Do not drive when taking Pain medications or sleeping medications (Benzodaizepines)  Do not take more than prescribed Pain, Sleep and Anxiety Medications. It is not advisable to combine anxiety,sleep and pain medications without talking with your primary care practitioner  Special Instructions: If you have smoked or chewed Tobacco  in the last 2 yrs please stop smoking, stop any regular Alcohol  and or any Recreational drug use.  Wear Seat belts while driving.  Please note: You were cared for by a hospitalist during your hospital stay. Once you are discharged, your primary care  physician will handle any further medical issues. Please note that NO REFILLS for any discharge medications will be authorized once you are discharged, as it is imperative that you return to your primary care physician (or establish a relationship with a primary care physician if you do not have one) for your post hospital discharge needs so that they can reassess your need for medications and monitor your lab values.   Increase activity  slowly   Complete by: As directed    No dressing needed   Complete by: As directed       Allergies as of 06/12/2021       Reactions   Penicillins    Rash        Medication List     STOP taking these medications    amLODipine 10 MG tablet Commonly known as: NORVASC   escitalopram 5 MG tablet Commonly known as: LEXAPRO   lisinopril 40 MG tablet Commonly known as: ZESTRIL   lisinopril-hydrochlorothiazide 20-25 MG tablet Commonly known as: ZESTORETIC   loperamide 2 MG tablet Commonly known as: Imodium A-D   psyllium 28 % packet Commonly known as: Metamucil Smooth Texture       TAKE these medications    aspirin 81 MG chewable tablet Chew 1 tablet (81 mg total) by mouth daily. Start taking on: June 13, 2021   feeding supplement Liqd Take 237 mLs by mouth 3 (three) times daily between meals.   folic acid 1 MG tablet Commonly known as: FOLVITE Take 1 tablet (1 mg total) by mouth daily. Start taking on: June 13, 2021   rosuvastatin 10 MG tablet Commonly known as: CRESTOR Take 1 tablet (10 mg total) by mouth daily. Start taking on: June 13, 2021   thiamine 100 MG tablet Take 1 tablet (100 mg total) by mouth daily. Start taking on: June 13, 2021               Durable Medical Equipment  (From admission, onward)           Start     Ordered   06/12/21 0739  For home use only DME 3 n 1  Once        06/12/21 0738   06/12/21 0739  For home use only DME Hospital bed  Once       Question Answer Comment  Length of Need 6 Months   Patient has (list medical condition): Physical deconditioning   The above medical condition requires: Patient requires the ability to reposition frequently   Bed type Semi-electric   Hoyer Lift Yes      06/12/21 0738   06/12/21 0738  For home use only DME Walker rolling  Once       Question Answer Comment  Walker: With [place]   Patient needs a walker to treat with the following condition  Physical deconditioning      06/12/21 0738              Discharge Care Instructions  (From admission, onward)           Start     Ordered   06/12/21 0000  No dressing needed        06/12/21 1039            Follow-up Information     O'Neal, Cassie Freer, MD. Go on 09/18/2021.   Specialties: Cardiology, Internal Medicine, Radiology Why: @8 :40am for hospital follow up Contact information: [place] [address] [phone]  Care, [facility] Follow up.   Specialty: [place] Why: HH-PT/OT/SW/ Aide arranged. They will contact you to schedule apt Contact information: [address] [place] [address] [phone]         [facility] Follow up.   Why: Office will call with date/time, If you dont hear from them,please give them a call Contact information: [address]     [address] [place] [phone]               Allergies  Allergen Reactions   Penicillins     Rash       Consultations: Psychiatry, cardiology   Other Procedures/Studies: DG Knee 1-2 Views Right  Result Date: 06/05/2021 CLINICAL DATA:  Recent fall with right knee pain, initial EXAM: RIGHT KNEE - 2 VIEW COMPARISON:  None. FINDINGS: No acute fracture or dislocation is noted. No joint effusion is seen. Mild degenerative changes are noted in the patellofemoral joint. IMPRESSION: Degenerative change without acute abnormality. Electronically Signed   By: Inez Catalina M.D.   On: 06/05/2021 15:37   CT HEAD WO CONTRAST  Result Date: 05/28/2021 CLINICAL DATA:  Trauma. EXAM: CT HEAD WITHOUT CONTRAST CT CERVICAL SPINE WITHOUT CONTRAST TECHNIQUE: Multidetector CT imaging of the head and cervical spine was performed following the standard protocol without intravenous contrast. Multiplanar CT image reconstructions of the cervical spine were also generated. COMPARISON:  None.  FINDINGS: CT HEAD FINDINGS Brain: Mild age-related atrophy and chronic microvascular ischemic changes. No acute intracranial hemorrhage. No mass effect or midline shift. No extra-axial fluid collection. Vascular: No hyperdense vessel or unexpected calcification. Skull: Normal. Negative for fracture or focal lesion. Sinuses/Orbits: Diffuse mucoperiosteal thickening of paranasal sinuses with partial opacification of several ethmoid air cells and left maxillary sinus. There is air-fluid level in the left maxillary sinus. The mastoid air cells are clear. Other: None CT CERVICAL SPINE FINDINGS Alignment: No acute subluxation. Skull base and vertebrae: No acute fracture. Soft tissues and spinal canal: No prevertebral fluid or swelling. No visible canal hematoma. Disc levels: Multilevel degenerative changes with multilevel facet arthropathy. Upper chest: Probable atelectasis. Other: None IMPRESSION: 1. No acute intracranial pathology. Mild age-related atrophy and chronic microvascular ischemic changes. 2. No acute/traumatic cervical spine pathology. Multilevel degenerative changes. 3. Paranasal sinus disease. Electronically Signed   By: Anner Crete M.D.   On: 05/28/2021 02:10   CT Chest W Contrast  Result Date: 05/28/2021 CLINICAL DATA:  Altered mental status, found down, hypothermic EXAM: CT CHEST WITH CONTRAST TECHNIQUE: Multidetector CT imaging of the chest was performed during intravenous contrast administration. CONTRAST:  143mL ISOVUE-370 IOPAMIDOL (ISOVUE-370) INJECTION 76% COMPARISON:  Chest radiograph dated 05/27/2021 FINDINGS: Cardiovascular: Heart is normal in size.  No pericardial effusion. No evidence of thoracic aortic aneurysm. Mild atherosclerotic calcifications of the aortic arch. Three vessel coronary atherosclerosis. Mediastinum/Nodes: No suspicious mediastinal lymphadenopathy. Visualized thyroid is unremarkable. Lungs/Pleura: Mild dependent atelectasis in the bilateral upper and lower lobes.  Superimposed mild ground-glass opacity in the inferior right middle lobe (series 8/image 103) and bilateral lung bases could reflect mild aspiration, particularly given associated mild endobronchial debris in the right lower lobe bronchi (series 7/image 111). No suspicious pulmonary nodules. No pleural effusion or pneumothorax. Upper Abdomen: Evaluated on dedicated CTA. Musculoskeletal: Nondisplaced right anterolateral 5th and 6th rib fractures (series 7/images 99 and 125). Thoracic spine is within normal limits. IMPRESSION: Nondisplaced right anterolateral 5th and 6th rib fractures. No pneumothorax is seen. Suspected mild aspiration in the  right middle lobe and bilateral lung bases. Aortic Atherosclerosis (ICD10-I70.0). Electronically Signed   By: Julian Hy M.D.   On: 05/28/2021 02:17   CT CERVICAL SPINE WO CONTRAST  Result Date: 05/28/2021 CLINICAL DATA:  Trauma. EXAM: CT HEAD WITHOUT CONTRAST CT CERVICAL SPINE WITHOUT CONTRAST TECHNIQUE: Multidetector CT imaging of the head and cervical spine was performed following the standard protocol without intravenous contrast. Multiplanar CT image reconstructions of the cervical spine were also generated. COMPARISON:  None. FINDINGS: CT HEAD FINDINGS Brain: Mild age-related atrophy and chronic microvascular ischemic changes. No acute intracranial hemorrhage. No mass effect or midline shift. No extra-axial fluid collection. Vascular: No hyperdense vessel or unexpected calcification. Skull: Normal. Negative for fracture or focal lesion. Sinuses/Orbits: Diffuse mucoperiosteal thickening of paranasal sinuses with partial opacification of several ethmoid air cells and left maxillary sinus. There is air-fluid level in the left maxillary sinus. The mastoid air cells are clear. Other: None CT CERVICAL SPINE FINDINGS Alignment: No acute subluxation. Skull base and vertebrae: No acute fracture. Soft tissues and spinal canal: No prevertebral fluid or swelling. No visible  canal hematoma. Disc levels: Multilevel degenerative changes with multilevel facet arthropathy. Upper chest: Probable atelectasis. Other: None IMPRESSION: 1. No acute intracranial pathology. Mild age-related atrophy and chronic microvascular ischemic changes. 2. No acute/traumatic cervical spine pathology. Multilevel degenerative changes. 3. Paranasal sinus disease. Electronically Signed   By: Anner Crete M.D.   On: 05/28/2021 02:10   CT PELVIS WO CONTRAST  Result Date: 06/07/2021 CLINICAL DATA:  Possible right greater trochanteric fracture. EXAM: CT PELVIS WITHOUT CONTRAST TECHNIQUE: Multidetector CT imaging of the pelvis was performed following the standard protocol without intravenous contrast. COMPARISON:  None. FINDINGS: Urinary Tract:  Bladder is distended. Bowel: No bowel dilatation. Diverticular disease noted sigmoid colon without diverticulitis. Vascular/Lymphatic: No pathologically enlarged lymph nodes. No significant vascular abnormality seen. Reproductive:  No mass or other significant abnormality Other:  No intraperitoneal free fluid. Musculoskeletal: No evidence for an acute fracture in the bony pelvis or either proximal femur. Advanced degenerative changes are seen in the right hip with loss of joint space, subchondral sclerosis and subchondral cyst formation. More modest degenerative changes are noted in the left hip. SI joints and symphysis pubis unremarkable. IMPRESSION: No acute bony abnormality. Specifically, no greater trochanteric fracture involving the proximal right femur as suggested on recent x-ray. Bilateral degenerative hip disease, right greater than left. Electronically Signed   By: Misty Stanley M.D.   On: 06/07/2021 12:18   MR BRAIN WO CONTRAST  Result Date: 06/05/2021 CLINICAL DATA:  Delirium; confusion. EXAM: MRI HEAD WITHOUT CONTRAST TECHNIQUE: Multiplanar, multiecho pulse sequences of the brain and surrounding structures were obtained without intravenous contrast.  COMPARISON:  Head CT 05/28/2021. FINDINGS: Brain: Intermittently motion degraded examination, limiting evaluation. Most notably, there is mild-to-moderate motion degradation of the axial T2 FLAIR sequence. Mild generalized cerebral atrophy. Moderate multifocal T2 FLAIR hyperintense signal abnormality within the cerebral white matter, nonspecific but compatible with chronic small vessel ischemic disease. Subcentimeter focus of SWI signal loss within the posterior left frontal lobe, which may reflect a chronic parenchymal microhemorrhage or cavernoma (series 12, image 44). There is no acute infarct. No evidence of an intracranial mass. No extra-axial fluid collection. No midline shift. Vascular: Maintained flow voids within the proximal large arterial vessels. Skull and upper cervical spine: No focal suspicious marrow lesion. Sinuses/Orbits: Visualized orbits show no acute finding. Moderate fluid level and background mild mucosal thickening within the left maxillary sinus. Small fluid level and background  trace mucosal thickening within the right sphenoid sinus. Mild mucosal thickening within the left sphenoid sinus. Mild mucosal thickening within the left greater than right ethmoid sinuses. Trace mucosal thickening within the left frontal sinus. IMPRESSION: Intermittently motion degraded examination, as described. No evidence of acute intracranial abnormality. Moderate chronic small vessel ischemic changes within the cerebral white matter. Subcentimeter focus of susceptibility-weighted signal loss within the posterior left frontal lobe, which may reflect a chronic parenchymal microhemorrhage or cavernoma. Mild generalized cerebral atrophy. Paranasal sinus disease, most notably left maxillary and right sphenoid. Correlate for acute sinusitis. Electronically Signed   By: Jackey Loge D.O.   On: 06/05/2021 12:21   CT Angio Aortobifemoral W and/or Wo Contrast  Result Date: 05/28/2021 CLINICAL DATA:  Found down,  cyanotic feet EXAM: CT ANGIOGRAPHY OF ABDOMINAL AORTA WITH ILIOFEMORAL RUNOFF TECHNIQUE: Multidetector CT imaging of the abdomen, pelvis and lower extremities was performed using the standard protocol during bolus administration of intravenous contrast. Multiplanar CT image reconstructions and MIPs were obtained to evaluate the vascular anatomy. CONTRAST:  ISOVUE-370 IOPAMIDOL (ISOVUE-370) INJECTION 76% COMPARISON:  None. FINDINGS: VASCULAR Aorta: Patent.  Mild atherosclerotic calcifications. Celiac: Patent.  Atherosclerotic calcifications at the origin. SMA: Patent. Renals: Patent bilaterally. Atherosclerotic calcifications of the origin on the left. IMA: Patent. RIGHT Lower Extremity Inflow: Patent. Outflow: Patent. Runoff: Patent three-vessel runoff. LEFT Lower Extremity Inflow: Patent. Outflow: Patent. Runoff: Patent three-vessel runoff. Veins: Unremarkable. Review of the MIP images confirms the above findings. NON-VASCULAR Lower chest: Evaluated on dedicated CT chest. Hepatobiliary: Liver is within normal limits. Gallbladder is unremarkable. No intrahepatic or extrahepatic duct dilatation. Pancreas: Within normal limits. Spleen: Within normal limits. Adrenals/Urinary Tract: Mild nodular thickening of the left adrenal gland without discrete mass. Bilateral renal cysts, measuring up to 4.2 cm in the left upper kidney (series 7/image 80). No hydronephrosis. Bladder is within normal limits. Stomach/Bowel: Stomach is within normal limits. No evidence of bowel obstruction. Appendix is not discretely visualized. Sigmoid diverticulosis, without evidence of diverticulitis. Lymphatic: No suspicious abdominopelvic lymphadenopathy. Reproductive: Prostate is unremarkable. Other: No abdominopelvic ascites. Musculoskeletal: Mild degenerative changes of the visualized thoracolumbar spine. IMPRESSION: Patent three-vessel runoff bilaterally. Additional ancillary findings as above. Electronically Signed   By: Charline Bills M.D.   On: 05/28/2021 02:12   DG Chest Port 1 View  Result Date: 05/30/2021 CLINICAL DATA:  Shortness of breath, COVID positive EXAM: PORTABLE CHEST 1 VIEW COMPARISON:  05/19/2021 FINDINGS: The heart size and mediastinal contours are within normal limits. Bibasilar scarring and or atelectasis, unchanged. The visualized skeletal structures are unremarkable. IMPRESSION: Bibasilar scarring and or atelectasis, unchanged in AP portable projection. No new airspace opacity. Electronically Signed   By: Jearld Lesch M.D.   On: 05/30/2021 08:40   DG Chest Port 1 View  Result Date: 05/29/2021 CLINICAL DATA:  70 year old male with history of shortness of breath. COVID positive patient. EXAM: PORTABLE CHEST 1 VIEW COMPARISON:  Chest x-ray 05/28/2021. FINDINGS: Low lung volumes. Bibasilar opacities which may reflect areas of atelectasis and/or consolidation. Small bilateral pleural effusions. No pneumothorax. No evidence of pulmonary edema. Heart size is normal. Upper mediastinal contours are within normal limits. IMPRESSION: 1. Low lung volumes with bibasilar areas of atelectasis and/or consolidation and small bilateral pleural effusions. Electronically Signed   By: Trudie Reed M.D.   On: 05/29/2021 08:03   DG Chest Port 1 View  Result Date: 05/28/2021 CLINICAL DATA:  70 year old male found down.  Positive COVID-19. EXAM: PORTABLE CHEST 1 VIEW COMPARISON:  CT chest  0102 hours today and earlier. FINDINGS: Portable AP upright view at 0757 hours. Mildly lower lung volumes compared to yesterday. Normal cardiac size and mediastinal contours. No pneumothorax or pleural effusion. Allowing for portable technique the lungs are clear. Visualized tracheal air column is within normal limits. Right 5th and 6th rib fractures better demonstrated by CT. No new osseous abnormality identified. Negative visible bowel gas. IMPRESSION: 1. No new cardiopulmonary abnormality. 2. Right 5th and 6th rib fractures better  demonstrated by CT today. Electronically Signed   By: Genevie Ann M.D.   On: 05/28/2021 08:09   DG Chest Port 1 View  Result Date: 05/27/2021 CLINICAL DATA:  Altered mental status, hypothermia EXAM: PORTABLE CHEST 1 VIEW COMPARISON:  None. FINDINGS: Heart and mediastinal contours are within normal limits. No focal opacities or effusions. No acute bony abnormality. IMPRESSION: No active cardiopulmonary disease. Electronically Signed   By: Rolm Baptise M.D.   On: 05/27/2021 22:41   EEG adult  Result Date: 05/29/2021 Lora Havens, MD     05/29/2021  5:30 PM Patient Name: Fred Graham MRN: [id number] Epilepsy Attending: Lora Havens Referring Physician/Provider: Shela Leff, MD Date: 05/29/2021 Duration: 25.14 mins Patient history: 70yo m with ams. EEG to evaluate for seizure Level of alertness: Asleep AEDs during EEG study: None Technical aspects: This EEG study was done with scalp electrodes positioned according to the 10-20 International system of electrode placement. Electrical activity was acquired at a sampling rate of 500Hz  and reviewed with a high frequency filter of 70Hz  and a low frequency filter of 1Hz . EEG data were recorded continuously and digitally stored. Description: Sleep was characterized by vertex waves, sleep spindles (12 to 14 Hz), maximal frontocentral region. Hyperventilation and photic stimulation were not performed.   IMPRESSION: This study during sleep only is within normal limits. No seizures or epileptiform discharges were seen throughout the recording. Lora Havens   ECHOCARDIOGRAM COMPLETE  Result Date: 05/30/2021    ECHOCARDIOGRAM REPORT   Patient Name:   Fred Graham Gilardi Date of Exam: 05/30/2021 Medical Rec #:  [id number]    Height:       74.0 in Accession #:    [id number]   Weight:       228.5 lb Date of Birth:  04-23-1951    BSA:          2.300 m Patient Age:    78 years     BP:           163/89 mmHg Patient Gender: M            HR:           82 bpm. Exam  Location:  Inpatient Procedure: 2D Echo, Cardiac Doppler and Color Doppler Indications:    R55 Syncope  History:        Patient has no prior history of Echocardiogram examinations.                 Risk Factors:Hypertension.  Sonographer:    Bernadene Person RDCS Referring Phys: [id number] [place]  1. Left ventricular ejection fraction, by estimation, is 45 to 50%. The left ventricle has mildly decreased function. The left ventricle demonstrates global hypokinesis. Left ventricular diastolic parameters are consistent with Grade I diastolic dysfunction (impaired relaxation).  2. Right ventricular systolic function is normal. The right ventricular size is normal.  3. The mitral valve is normal in structure. No evidence of mitral valve regurgitation. No evidence of mitral stenosis.  4. The  aortic valve is normal in structure. Aortic valve regurgitation is not visualized. No aortic stenosis is present.  5. Aortic dilatation noted. There is mild dilatation of the ascending aorta, measuring 40 mm. There is mild dilatation of the aortic root, measuring 38 mm.  6. The inferior vena cava is normal in size with greater than 50% respiratory variability, suggesting right atrial pressure of 3 mmHg. FINDINGS  Left Ventricle: Left ventricular ejection fraction, by estimation, is 45 to 50%. The left ventricle has mildly decreased function. The left ventricle demonstrates global hypokinesis. The left ventricular internal cavity size was normal in size. There is  no left ventricular hypertrophy. Left ventricular diastolic parameters are consistent with Grade I diastolic dysfunction (impaired relaxation). Normal left ventricular filling pressure. Right Ventricle: The right ventricular size is normal. No increase in right ventricular wall thickness. Right ventricular systolic function is normal. Left Atrium: Left atrial size was normal in size. Right Atrium: Right atrial size was normal in size. Pericardium: There is no  evidence of pericardial effusion. Mitral Valve: The mitral valve is normal in structure. Mild mitral annular calcification. No evidence of mitral valve regurgitation. No evidence of mitral valve stenosis. Tricuspid Valve: The tricuspid valve is normal in structure. Tricuspid valve regurgitation is not demonstrated. No evidence of tricuspid stenosis. Aortic Valve: The aortic valve is normal in structure. Aortic valve regurgitation is not visualized. No aortic stenosis is present. Pulmonic Valve: The pulmonic valve was normal in structure. Pulmonic valve regurgitation is not visualized. No evidence of pulmonic stenosis. Aorta: Aortic dilatation noted. There is mild dilatation of the ascending aorta, measuring 40 mm. There is mild dilatation of the aortic root, measuring 38 mm. Venous: The inferior vena cava is normal in size with greater than 50% respiratory variability, suggesting right atrial pressure of 3 mmHg. IAS/Shunts: No atrial level shunt detected by color flow Doppler.  LEFT VENTRICLE PLAX 2D LVIDd:         3.50 cm      Diastology LVIDs:         2.80 cm      LV e' medial:    6.24 cm/s LV PW:         1.00 cm      LV E/e' medial:  7.3 LV IVS:        1.00 cm      LV e' lateral:   9.89 cm/s LVOT diam:     2.20 cm      LV E/e' lateral: 4.6 LV SV:         63 LV SV Index:   27 LVOT Area:     3.80 cm  LV Volumes (MOD) LV vol d, MOD A2C: 95.3 ml LV vol d, MOD A4C: 115.0 ml LV vol s, MOD A2C: 46.5 ml LV vol s, MOD A4C: 59.5 ml LV SV MOD A2C:     48.8 ml LV SV MOD A4C:     115.0 ml LV SV MOD BP:      52.3 ml RIGHT VENTRICLE RV S prime:     14.70 cm/s TAPSE (M-mode): 1.5 cm LEFT ATRIUM             Index        RIGHT ATRIUM           Index LA diam:        2.50 cm 1.09 cm/m   RA Area:     12.70 cm LA Vol (A2C):   48.6 ml 21.13 ml/m  RA  Volume:   24.90 ml  10.83 ml/m LA Vol (A4C):   43.2 ml 18.78 ml/m LA Biplane Vol: 49.1 ml 21.35 ml/m  AORTIC VALVE LVOT Vmax:   95.30 cm/s LVOT Vmean:  75.800 cm/s LVOT VTI:    0.165  m  AORTA Ao Root diam: 3.80 cm Ao Asc diam:  4.00 cm MITRAL VALVE MV Area (PHT): 3.68 cm    SHUNTS MV Decel Time: 206 msec    Systemic VTI:  0.16 m MV E velocity: 45.40 cm/s  Systemic Diam: 2.20 cm MV A velocity: 83.60 cm/s MV E/A ratio:  0.54 Mihai Croitoru MD Electronically signed by Sanda Klein MD Signature Date/Time: 05/30/2021/3:12:31 PM    Final    DG HIP UNILAT WITH PELVIS 1V RIGHT  Result Date: 06/05/2021 CLINICAL DATA:  Recent fall with hip pain, initial encounter EXAM: DG HIP (WITH OR WITHOUT PELVIS) 3V RIGHT COMPARISON:  12/06/2013 FINDINGS: Degenerative changes of the right hip joint are seen. Lucency is noted along the greater trochanter consistent with an undisplaced greater trochanter fracture. No other fractures are seen. IMPRESSION: Lucency along the right greater trochanter consistent with undisplaced fracture. CT of the pelvis may be helpful to assess for more occult injury. Degenerative changes of the right hip joint. Electronically Signed   By: Inez Catalina M.D.   On: 06/05/2021 15:37     TODAY-DAY OF DISCHARGE:  Subjective:   Fred Graham today has no headache,no chest abdominal pain,no new weakness tingling or numbness, feels much better wants to go home today.   Objective:   Blood pressure (!) 105/50, pulse (!) 103, temperature 98 F (36.7 C), temperature source Oral, resp. rate 20, height 6\' 2"  (1.88 m), weight 87.8 kg, SpO2 92 %.  Intake/Output Summary (Last 24 hours) at 06/12/2021 1039 Last data filed at 06/12/2021 0443 Gross per 24 hour  Intake --  Output 400 ml  Net -400 ml   Filed Weights   05/28/21 0014 06/06/21 1905  Weight: 103.6 kg 87.8 kg    Exam: Awake Alert, Oriented *3, No new F.N deficits, Normal affect [place].AT,PERRAL Supple Neck,No JVD, No cervical lymphadenopathy appriciated.  Symmetrical Chest wall movement, Good air movement bilaterally, CTAB RRR,No Gallops,Rubs or new Murmurs, No Parasternal Heave +ve B.Sounds, Abd Soft, Non tender, No  organomegaly appriciated, No rebound -guarding or rigidity. No Cyanosis, Clubbing or edema, No new Rash or bruise   PERTINENT RADIOLOGIC STUDIES: No results found.   PERTINENT LAB RESULTS: CBC: Recent Labs    06/10/21 0156  WBC 7.4  HGB 13.0  HCT 37.6*  PLT 211   CMET CMP     Component Value Date/Time   NA 138 06/10/2021 0156   NA 124 (L) 05/05/2019 1607   K 3.8 06/10/2021 0156   CL 105 06/10/2021 0156   CO2 25 06/10/2021 0156   GLUCOSE 97 06/10/2021 0156   BUN 38 (H) 06/10/2021 0156   BUN 9 05/05/2019 1607   CREATININE 0.84 06/10/2021 0156   CREATININE 0.75 08/28/2015 1542   CALCIUM 8.8 (L) 06/10/2021 0156   PROT 5.5 (L) 06/02/2021 0101   PROT 6.8 05/05/2019 1607   ALBUMIN 2.9 (L) 06/02/2021 0101   ALBUMIN 4.8 05/05/2019 1607   AST 46 (H) 06/02/2021 0101   ALT 68 (H) 06/02/2021 0101   ALKPHOS 41 06/02/2021 0101   BILITOT 0.6 06/02/2021 0101   BILITOT 0.6 05/05/2019 1607   GFRNONAA >60 06/10/2021 0156   GFRAA 106 05/05/2019 1607    GFR Estimated Creatinine Clearance: 95.1 mL/min (  by C-G formula based on SCr of 0.84 mg/dL). No results for input(s): LIPASE, AMYLASE in the last 72 hours. No results for input(s): CKTOTAL, CKMB, CKMBINDEX, TROPONINI in the last 72 hours. Invalid input(s): POCBNP No results for input(s): DDIMER in the last 72 hours. No results for input(s): HGBA1C in the last 72 hours. No results for input(s): CHOL, HDL, LDLCALC, TRIG, CHOLHDL, LDLDIRECT in the last 72 hours. No results for input(s): TSH, T4TOTAL, T3FREE, THYROIDAB in the last 72 hours.  Invalid input(s): FREET3 No results for input(s): VITAMINB12, FOLATE, FERRITIN, TIBC, IRON, RETICCTPCT in the last 72 hours. Coags: No results for input(s): INR in the last 72 hours.  Invalid input(s): PT Microbiology: No results found for this or any previous visit (from the past 240 hour(s)).  FURTHER DISCHARGE INSTRUCTIONS:  Get Medicines reviewed and adjusted: Please take all your  medications with you for your next visit with your Primary MD  Laboratory/radiological data: Please request your Primary MD to go over all hospital tests and procedure/radiological results at the follow up, please ask your Primary MD to get all Hospital records sent to his/her office.  In some cases, they will be blood work, cultures and biopsy results pending at the time of your discharge. Please request that your primary care M.D. goes through all the records of your hospital data and follows up on these results.  Also Note the following: If you experience worsening of your admission symptoms, develop shortness of breath, life threatening emergency, suicidal or homicidal thoughts you must seek medical attention immediately by calling 911 or calling your MD immediately  if symptoms less severe.  You must read complete instructions/literature along with all the possible adverse reactions/side effects for all the Medicines you take and that have been prescribed to you. Take any new Medicines after you have completely understood and accpet all the possible adverse reactions/side effects.   Do not drive when taking Pain medications or sleeping medications (Benzodaizepines)  Do not take more than prescribed Pain, Sleep and Anxiety Medications. It is not advisable to combine anxiety,sleep and pain medications without talking with your primary care practitioner  Special Instructions: If you have smoked or chewed Tobacco  in the last 2 yrs please stop smoking, stop any regular Alcohol  and or any Recreational drug use.  Wear Seat belts while driving.  Please note: You were cared for by a hospitalist during your hospital stay. Once you are discharged, your primary care physician will handle any further medical issues. Please note that NO REFILLS for any discharge medications will be authorized once you are discharged, as it is imperative that you return to your primary care physician (or establish a  relationship with a primary care physician if you do not have one) for your post hospital discharge needs so that they can reassess your need for medications and monitor your lab values.  Total Time spent coordinating discharge including counseling, education and face to face time equals 35 minutes.  SignedOren Binet 06/12/2021 10:39 AM

## 2021-07-19 DEATH — deceased

## 2021-09-17 NOTE — Progress Notes (Deleted)
?Cardiology Office Note:   ?Date:  09/17/2021  ?NAME:  Fred Graham    ?MRN: [id number] ?DOB:  [birth year]  ? ?PCP:  [name], NP  ?Cardiologist:  None  ?Electrophysiologist:  None  ? ?Referring MD: [name], NP  ? ?No chief complaint on file. ?*** ? ?History of Present Illness:   ?Fred Graham is a 71 y.o. male with a hx of etoh abuse who presents for follow-up of heart failure. Admitted 05/2021 for aspiration PNA/covid/etoh abuse/rhabdomyolysis. Had mildly reduced EF in setting of critical illness but no clinical heart failure. Medical therapy recommended.  ? ?Problem List ?Etoh abuse ?HTN ?HFmEF ?-EF 45-50% 05/2021 in setting of sepsis/rhabdomyolysis  ? ?Past Medical History: ?Past Medical History:  ?Diagnosis Date  ? Allergy   ? Arthritis   ? hands, hips  ? Hypertension   ? ? ?Past Surgical History: ?No past surgical history on file. ? ?Current Medications: ?No outpatient medications have been marked as taking for the 09/18/21 encounter (Appointment) with O'Neal, Ronnald Ramp, MD.  ?  ? ?Allergies:    ?Penicillins  ? ?Social History: ?Social History  ? ?Socioeconomic History  ? Marital status: Divorced  ?  Spouse name: Not on file  ? Number of children: 1  ? Years of education: Not on file  ? Highest education level: Not on file  ?Occupational History  ? Not on file  ?Tobacco Use  ? Smoking status: Former  ?  Packs/day: 1.00  ?  Years: 5.00  ?  Pack years: 5.00  ?  Types: Cigarettes  ?  Quit date: 10/19/1993  ?  Years since quitting: 27.9  ? Smokeless tobacco: Never  ?Vaping Use  ? Vaping Use: Never used  ?Substance and Sexual Activity  ? Alcohol use: Yes  ?  Alcohol/week: 10.0 standard drinks  ?  Types: 10 Cans of beer per week  ? Drug use: No  ? Sexual activity: Never  ?  Birth control/protection: None  ?Other Topics Concern  ? Not on file  ?Social History Narrative  ? Marital status: divorced.  ?    Children: 1 daughter; 4 grandchildren.  ?    Employment: retired from [employer] in 2010; odd jobs.  ?    Tobacco: former smoker.  ?     Alcohol:  4 beers per day.  ?    Exercise: walking.  ? ?Social Determinants of Health  ? ?Financial Resource Strain: Not on file  ?Food Insecurity: Not on file  ?Transportation Needs: Not on file  ?Physical Activity: Not on file  ?Stress: Not on file  ?Social Connections: Not on file  ?  ? ?Family History: ?The patient's ***family history includes Cancer in his brother; Depression in his father; Heart disease (age of onset: 38) in his father; Heart disease (age of onset: 48) in his brother. ? ?ROS:   ?All other ROS reviewed and negative. Pertinent positives noted in the HPI.    ? ?EKGs/Labs/Other Studies Reviewed:   ?The following studies were personally reviewed by me today: ? ?EKG:  EKG is *** ordered today.  The ekg ordered today demonstrates ***, and was personally reviewed by me.  ? ?Recent Labs: ?05/28/2021: TSH 2.252 ?06/01/2021: B Natriuretic Peptide 155.0 ?06/02/2021: ALT 68 ?06/10/2021: BUN 38; Creatinine, Ser 0.84; Hemoglobin 13.0; Magnesium 2.4; Platelets 211; Potassium 3.8; Sodium 138  ? ?Recent Lipid Panel ?   ?Component Value Date/Time  ? CHOL 202 (H) 05/05/2019 1607  ? TRIG 75 05/05/2019  1607  ? HDL 103 05/05/2019 1607  ? CHOLHDL 2.0 05/05/2019 1607  ? CHOLHDL 2.4 01/30/2015 1447  ? VLDL 13 01/30/2015 1447  ? LDLCALC 86 05/05/2019 1607  ? LDLDIRECT 135 (H) 02/22/2013 1650  ? ? ?Physical Exam:   ?VS:  There were no vitals taken for this visit.   ?Wt Readings from Last 3 Encounters:  ?05/05/19 205 lb (93 kg)  ?10/20/18 210 lb (95.3 kg)  ?06/10/18 200 lb 9.6 oz (91 kg)  ?  ?General: Well nourished, well developed, in no acute distress ?Head: Atraumatic, normal size  ?Eyes: PEERLA, EOMI  ?Neck: Supple, no JVD ?Endocrine: No thryomegaly ?Cardiac: Normal S1, S2; RRR; no murmurs, rubs, or gallops ?Lungs: Clear to auscultation bilaterally, no wheezing, rhonchi or rales  ?Abd: Soft, nontender, no hepatomegaly  ?Ext: No edema, pulses  2+ ?Musculoskeletal: No deformities, BUE and BLE strength normal and equal ?Skin: Warm and dry, no rashes   ?Neuro: Alert and oriented to person, place, time, and situation, CNII-XII grossly intact, no focal deficits  ?Psych: Normal mood and affect  ? ?ASSESSMENT:   ?Fred Graham is a 71 y.o. male who presents for the following: ?No diagnosis found. ? ?PLAN:   ?There are no diagnoses linked to this encounter. ? ?{Are you ordering a CV Procedure (e.g. stress test, cath, DCCV, TEE, etc)?   Press F2        :[id number]} ? ?Disposition: No follow-ups on file. ? ?Medication Adjustments/Labs and Tests Ordered: ?Current medicines are reviewed at length with the patient today.  Concerns regarding medicines are outlined above.  ?No orders of the defined types were placed in this encounter. ? ?No orders of the defined types were placed in this encounter. ? ? ?There are no Patient Instructions on file for this visit.  ? ?Time Spent with Patient: I have spent a total of *** minutes with patient reviewing hospital notes, telemetry, EKGs, labs and examining the patient as well as establishing an assessment and plan that was discussed with the patient.  > 50% of time was spent in direct patient care. ? ?Signed, ?Gerri Spore T. Flora Lipps, MD, [facility] ?[place]  [facility]  ?[address], [address] ?[place], [place] [address] ?[phone]  ?09/17/2021 6:41 PM    ? ?

## 2021-09-18 ENCOUNTER — Ambulatory Visit: Payer: Medicare Other | Admitting: Cardiovascular Disease

## 2021-09-18 DIAGNOSIS — I5022 Chronic systolic (congestive) heart failure: Secondary | ICD-10-CM

## 2021-09-25 ENCOUNTER — Encounter: Payer: Self-pay | Admitting: Cardiovascular Disease

## 2022-08-17 IMAGING — CT CT PELVIS W/O CM
2 of 4 series · 14 of 46 positions shown, 16 images · non-contrast
Comparison: None.

CLINICAL DATA: Possible right greater trochanteric fracture.

EXAM:
CT PELVIS WITHOUT CONTRAST
TECHNIQUE: Multidetector CT imaging of the pelvis was performed following the
standard protocol without intravenous contrast.

[Series 7: pelvis 2.0 st · axial · 0.74mm/px · z∈[+999,+1199]mm · 11 of 116 slices shown, 13 images]
[im 8/116  soft-tissue]
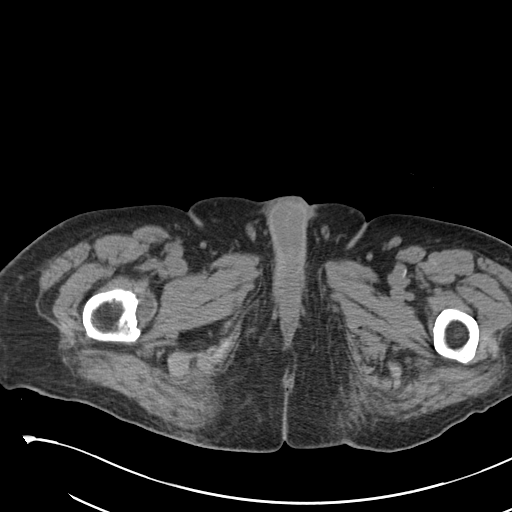
[im 8/116  bone]
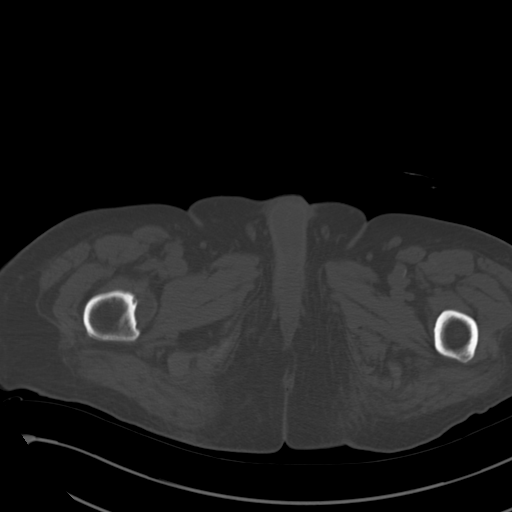
[im 16/116  soft-tissue]
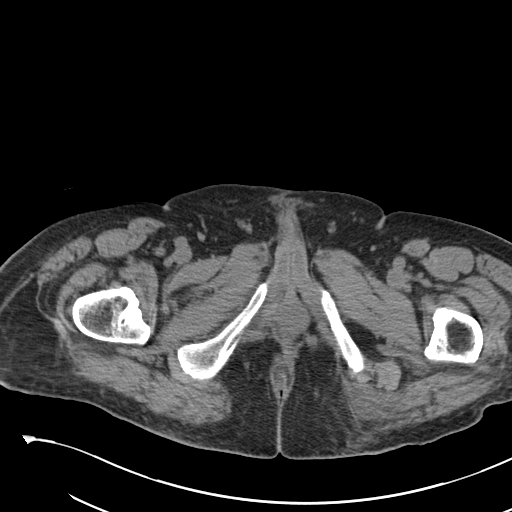
[im 31/116  soft-tissue]
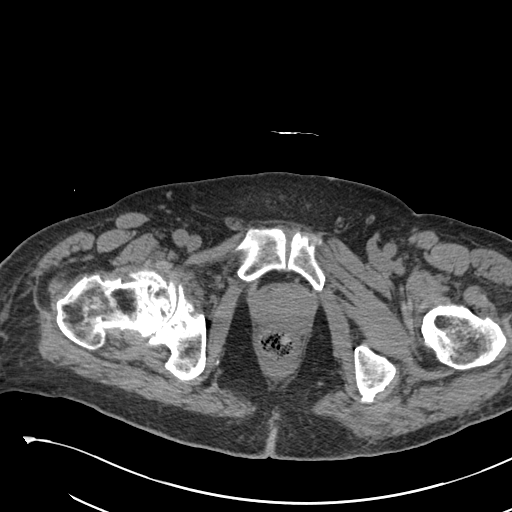
[im 39/116  soft-tissue]
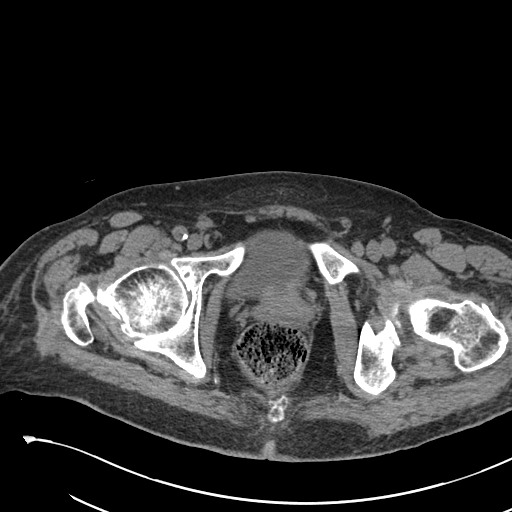
[im 47/116  soft-tissue]
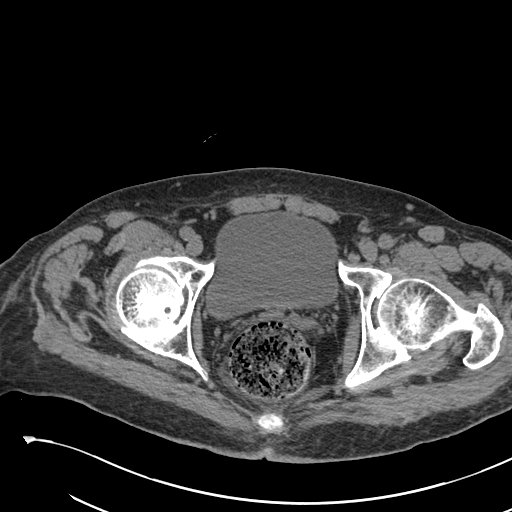
[im 62/116  soft-tissue]
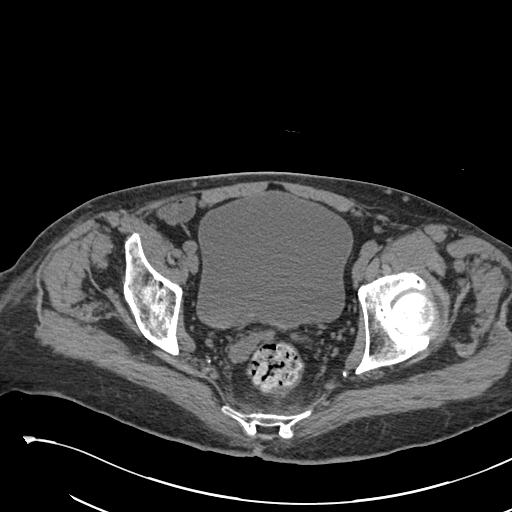
[im 70/116  soft-tissue]
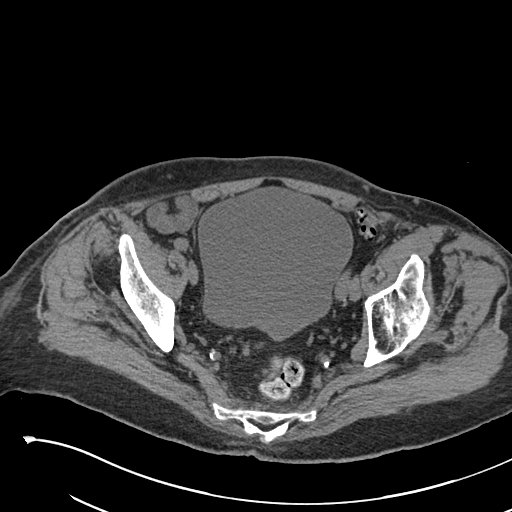
[im 77/116  soft-tissue]
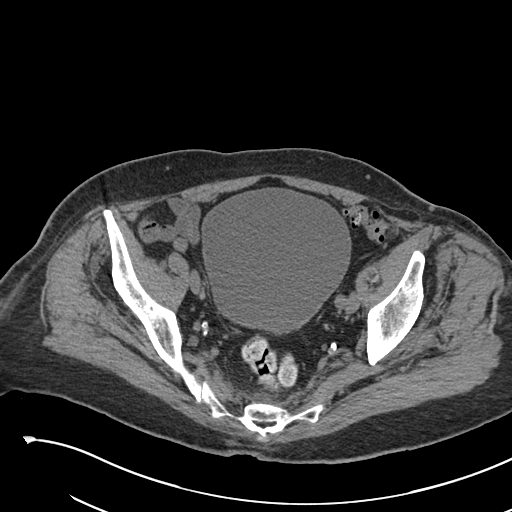
[im 85/116  soft-tissue]
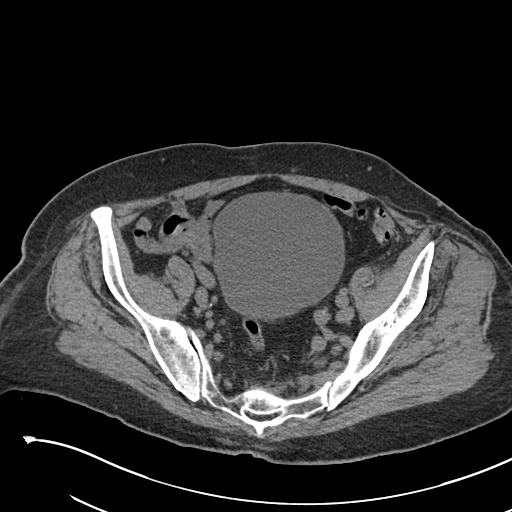
[im 85/116  bone]
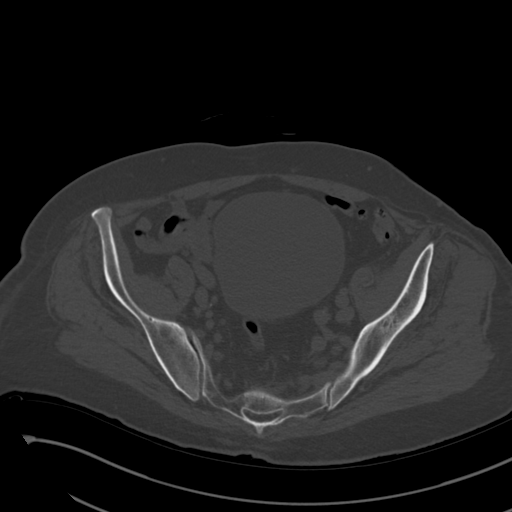
[im 100/116  soft-tissue]
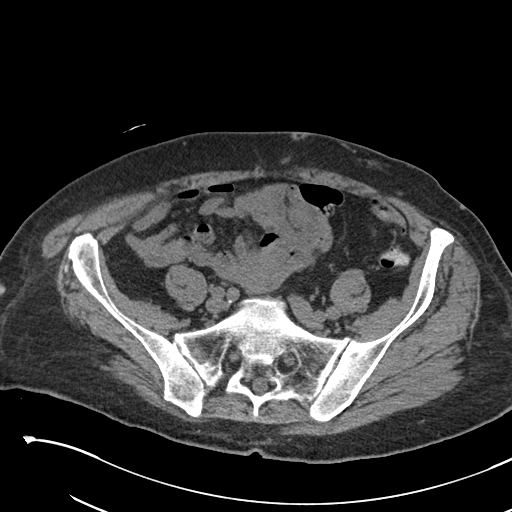
[im 108/116  soft-tissue]
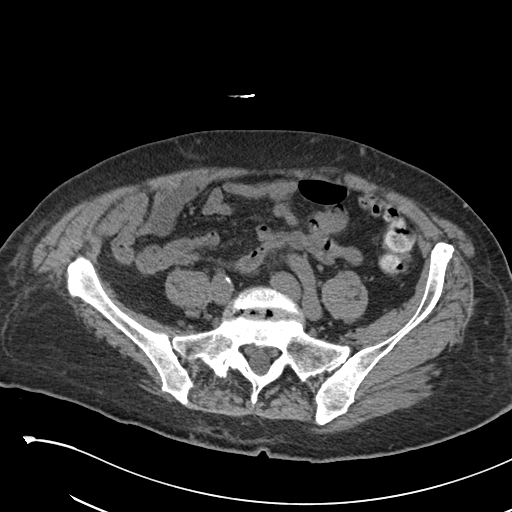

[Series 8: pelvis 2.0 cor. bone · coronal · 0.47mm/px · 3 of 156 slices shown]
[im 52/156  soft-tissue]
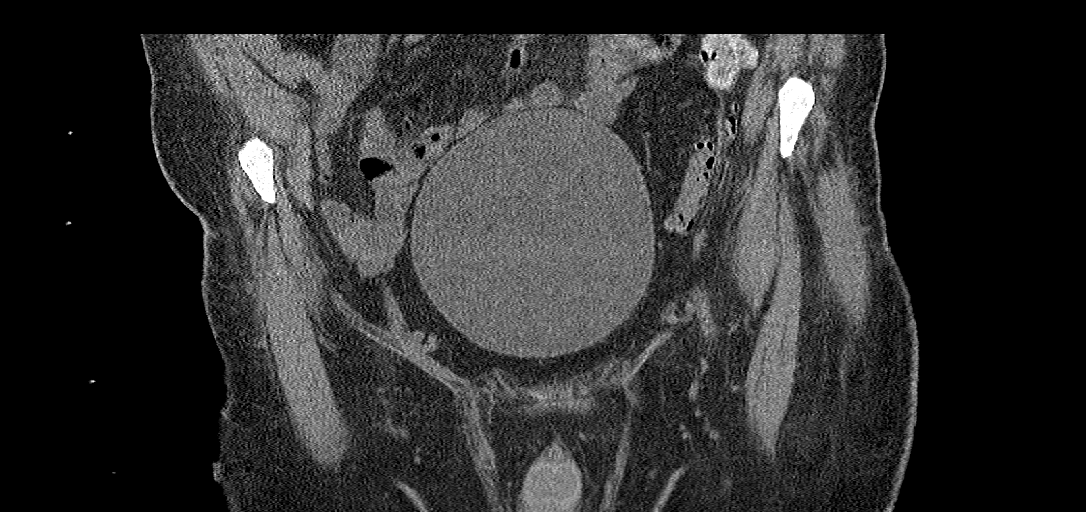
[im 69/156  soft-tissue]
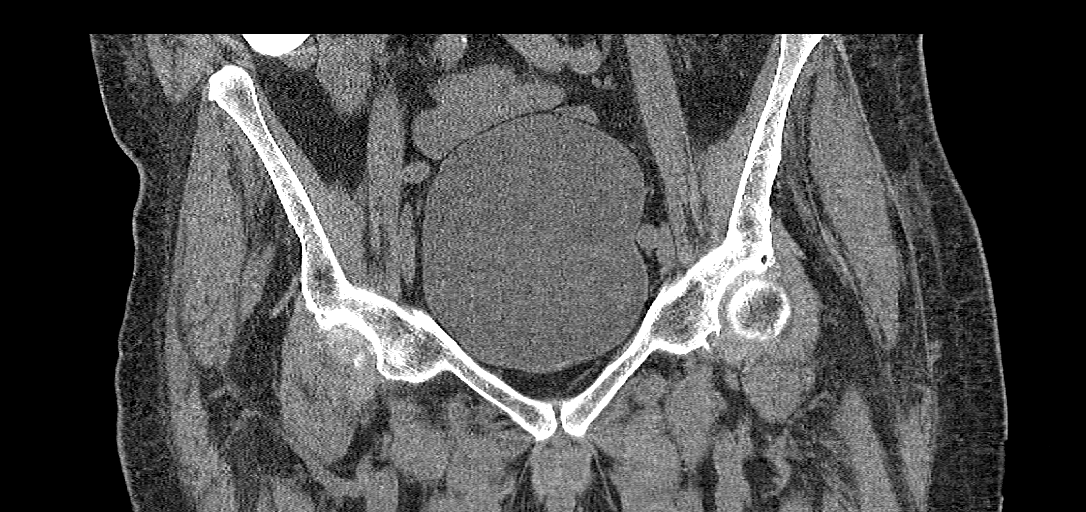
[im 87/156  soft-tissue]
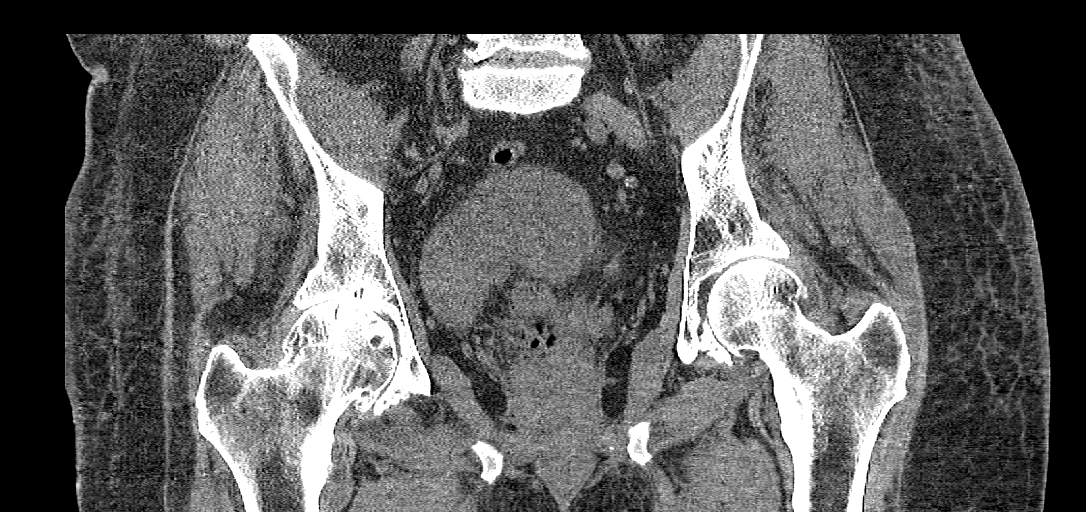

[14 of 46 positions shown; findings below may reference images not displayed]

FINDINGS: Urinary Tract:  Bladder is distended.

Bowel: No bowel dilatation. Diverticular disease noted sigmoid colon
without diverticulitis.

Vascular/Lymphatic: No pathologically enlarged lymph nodes. No
significant vascular abnormality seen.

Reproductive:  No mass or other significant abnormality

Other:  No intraperitoneal free fluid.

Musculoskeletal: No evidence for an acute fracture in the bony
pelvis or either proximal femur. Advanced degenerative changes are
seen in the right hip with loss of joint space, subchondral
sclerosis and subchondral cyst formation. More modest degenerative
changes are noted in the left hip. SI joints and symphysis pubis
unremarkable.
IMPRESSION: No acute bony abnormality. Specifically, no greater trochanteric
fracture involving the proximal right femur as suggested on recent
x-ray.

Bilateral degenerative hip disease, right greater than left.
